# Patient Record
Sex: Female | Born: 1970
Health system: Southern US, Community
[De-identification: ages and names within clinical notes are randomized; demographics above are authoritative.]

## PROBLEM LIST (undated history)

## (undated) DIAGNOSIS — F411 Generalized anxiety disorder: Secondary | ICD-10-CM

## (undated) DIAGNOSIS — F329 Major depressive disorder, single episode, unspecified: Secondary | ICD-10-CM

## (undated) DIAGNOSIS — F4310 Post-traumatic stress disorder, unspecified: Secondary | ICD-10-CM

## (undated) DIAGNOSIS — F900 Attention-deficit hyperactivity disorder, predominantly inattentive type: Secondary | ICD-10-CM

## (undated) DIAGNOSIS — E669 Obesity, unspecified: Secondary | ICD-10-CM

## (undated) DIAGNOSIS — G4733 Obstructive sleep apnea (adult) (pediatric): Secondary | ICD-10-CM

## (undated) DIAGNOSIS — M1990 Unspecified osteoarthritis, unspecified site: Secondary | ICD-10-CM

## (undated) DIAGNOSIS — T7840XA Allergy, unspecified, initial encounter: Secondary | ICD-10-CM

## (undated) DIAGNOSIS — E119 Type 2 diabetes mellitus without complications: Secondary | ICD-10-CM

## (undated) DIAGNOSIS — I2699 Other pulmonary embolism without acute cor pulmonale: Secondary | ICD-10-CM

## (undated) HISTORY — DX: Major depressive disorder, single episode, unspecified: F32.9

## (undated) HISTORY — DX: Post-traumatic stress disorder, unspecified: F43.10

## (undated) HISTORY — PX: SPINE SURGERY: SHX786

## (undated) HISTORY — DX: Unspecified osteoarthritis, unspecified site: M19.90

## (undated) HISTORY — DX: Attention-deficit hyperactivity disorder, predominantly inattentive type: F90.0

## (undated) HISTORY — PX: CHOLECYSTECTOMY: SHX55

## (undated) HISTORY — DX: Generalized anxiety disorder: F41.1

## (undated) HISTORY — PX: OTHER SURGICAL HISTORY: SHX169

## (undated) HISTORY — DX: Allergy, unspecified, initial encounter: T78.40XA

## (undated) HISTORY — DX: Obstructive sleep apnea (adult) (pediatric): G47.33

## (undated) HISTORY — DX: Obesity, unspecified: E66.9

## (undated) HISTORY — PX: BACK SURGERY: SHX140

---

## 2014-02-06 LAB — HM PAP SMEAR

## 2014-02-07 DIAGNOSIS — R87612 Low grade squamous intraepithelial lesion on cytologic smear of cervix (LGSIL): Secondary | ICD-10-CM | POA: Insufficient documentation

## 2014-06-10 DIAGNOSIS — J9819 Other pulmonary collapse: Secondary | ICD-10-CM

## 2014-06-10 HISTORY — DX: Other pulmonary collapse: J98.19

## 2015-05-09 ENCOUNTER — Encounter: Payer: Self-pay | Admitting: Physician Assistant

## 2015-05-09 ENCOUNTER — Other Ambulatory Visit: Payer: Self-pay | Admitting: Physician Assistant

## 2015-05-09 ENCOUNTER — Ambulatory Visit (INDEPENDENT_AMBULATORY_CARE_PROVIDER_SITE_OTHER): Payer: BLUE CROSS/BLUE SHIELD | Admitting: Physician Assistant

## 2015-05-09 VITALS — BP 142/80 | HR 98 | Wt 230.0 lb

## 2015-05-09 DIAGNOSIS — G4700 Insomnia, unspecified: Secondary | ICD-10-CM

## 2015-05-09 DIAGNOSIS — A6920 Lyme disease, unspecified: Secondary | ICD-10-CM | POA: Diagnosis not present

## 2015-05-09 DIAGNOSIS — R5382 Chronic fatigue, unspecified: Secondary | ICD-10-CM

## 2015-05-09 DIAGNOSIS — R002 Palpitations: Secondary | ICD-10-CM | POA: Diagnosis not present

## 2015-05-09 DIAGNOSIS — F331 Major depressive disorder, recurrent, moderate: Secondary | ICD-10-CM

## 2015-05-09 DIAGNOSIS — R87619 Unspecified abnormal cytological findings in specimens from cervix uteri: Secondary | ICD-10-CM

## 2015-05-09 DIAGNOSIS — F900 Attention-deficit hyperactivity disorder, predominantly inattentive type: Secondary | ICD-10-CM

## 2015-05-09 DIAGNOSIS — R5383 Other fatigue: Secondary | ICD-10-CM | POA: Insufficient documentation

## 2015-05-09 DIAGNOSIS — E785 Hyperlipidemia, unspecified: Secondary | ICD-10-CM | POA: Insufficient documentation

## 2015-05-09 DIAGNOSIS — F329 Major depressive disorder, single episode, unspecified: Secondary | ICD-10-CM | POA: Insufficient documentation

## 2015-05-09 HISTORY — DX: Insomnia, unspecified: G47.00

## 2015-05-09 HISTORY — DX: Lyme disease, unspecified: A69.20

## 2015-05-09 HISTORY — DX: Hyperlipidemia, unspecified: E78.5

## 2015-05-09 LAB — CBC WITH DIFFERENTIAL/PLATELET
BASOS PCT: 0 % (ref 0–1)
Basophils Absolute: 0 10*3/uL (ref 0.0–0.1)
EOS PCT: 1 % (ref 0–5)
Eosinophils Absolute: 0.1 10*3/uL (ref 0.0–0.7)
HCT: 42.6 % (ref 36.0–46.0)
Hemoglobin: 14.4 g/dL (ref 12.0–15.0)
Lymphocytes Relative: 29 % (ref 12–46)
Lymphs Abs: 2.6 10*3/uL (ref 0.7–4.0)
MCH: 30.3 pg (ref 26.0–34.0)
MCHC: 33.8 g/dL (ref 30.0–36.0)
MCV: 89.5 fL (ref 78.0–100.0)
MONO ABS: 0.3 10*3/uL (ref 0.1–1.0)
MPV: 9.9 fL (ref 8.6–12.4)
Monocytes Relative: 3 % (ref 3–12)
Neutro Abs: 6 10*3/uL (ref 1.7–7.7)
Neutrophils Relative %: 67 % (ref 43–77)
PLATELETS: 364 10*3/uL (ref 150–400)
RBC: 4.76 MIL/uL (ref 3.87–5.11)
RDW: 14.3 % (ref 11.5–15.5)
WBC: 8.9 10*3/uL (ref 4.0–10.5)

## 2015-05-09 LAB — C-REACTIVE PROTEIN: CRP: 1.8 mg/dL — AB (ref <0.60)

## 2015-05-09 LAB — COMPLETE METABOLIC PANEL WITH GFR
ALBUMIN: 4.3 g/dL (ref 3.6–5.1)
ALK PHOS: 92 U/L (ref 33–115)
ALT: 18 U/L (ref 6–29)
AST: 21 U/L (ref 10–35)
BUN: 11 mg/dL (ref 7–25)
CALCIUM: 9.4 mg/dL (ref 8.6–10.2)
CO2: 21 mmol/L (ref 20–31)
CREATININE: 0.73 mg/dL (ref 0.50–1.10)
Chloride: 104 mmol/L (ref 98–110)
GFR, Est Non African American: >89 mL/min (ref >=60)
Glucose, Bld: 100 mg/dL — ABNORMAL HIGH (ref 65–99)
POTASSIUM: 4.3 mmol/L (ref 3.5–5.3)
Sodium: 137 mmol/L (ref 135–146)
Total Bilirubin: 0.5 mg/dL (ref 0.2–1.2)
Total Protein: 7.1 g/dL (ref 6.1–8.1)

## 2015-05-09 LAB — VITAMIN B12: VITAMIN B 12: 557 pg/mL (ref 200–1100)

## 2015-05-09 LAB — TSH: TSH: 1.12 mIU/L

## 2015-05-09 NOTE — Progress Notes (Addendum)
Subjective:    Patient ID: Julie Johnston, female    DOB: 23-Jan-1971, 45 y.o.   MRN: [id number]  HPI  Patient is a 45 year old female presenting to establish care. Patient's past medical history is relevant for Lyme disease, chronic fatigue, depression, palpitations, and ADHD.  Patient states that she has experienced palpitations and chest tightness intermittently for the past "several years". Patient states that palpitations have become more intense over the past 2.5 weeks with symptoms worsening when supine. Patient states that she has had rhinorrhea and congestion. Patient has been taking Sudafed. Patient states that she is concerned about her renal and kidney function due to the numerous medications that she is currently taking. Patient denies nausea, vomiting, abdominal pain, hematuria, dysuria, fever and shortness of breath.   Review of Systems    Please see HPI.  Objective:   Physical Exam  Constitutional: She is oriented to person, place, and time. She appears well-developed and well-nourished.  HENT:  Head: Normocephalic and atraumatic.  Right Ear: External ear normal.  Left Ear: External ear normal.  Nose: Nose normal.  Mouth/Throat: Oropharynx is clear and moist.  Eyes: Conjunctivae and EOM are normal. Pupils are equal, round, and reactive to light.  Neck: Normal range of motion. No thyromegaly present.  Cardiovascular: Normal rate, regular rhythm and normal heart sounds.   Pulmonary/Chest: Breath sounds normal. No respiratory distress. She has no wheezes. She has no rales.  Abdominal: Soft. Bowel sounds are normal. She exhibits no distension. There is no tenderness. There is no rebound and no guarding.  Musculoskeletal: Normal range of motion.  Neurological: She is alert and oriented to person, place, and time.  Skin: Skin is warm and dry.  Nails were some striated with white characteristic extending to mid nail with no luna.   Psychiatric: She has a normal mood and  affect. Her behavior is normal. Judgment and thought content normal.      Assessment & Plan:   1. Palpitations/elevated BP Patient states that she has experienced palpitations and chest tightness intermittently over the past ten years with worsening of symptoms over the past two weeks. Patient's in office ECG was within normal range. Patient is not under the care of cardiology. Patient was provided patient education about ways to reduce stress and advised to avoid beverages with caffeine. Patient was advised to follow up for Holter monitor if symptoms persist. Patient's labs will be reviewed to rule out a metabolic cause for palpitations. Will recheck BP in 2 months.   2. Hyperlipidemia. Patient will undergo lipid panel to assess for hyperlipidemia.   3. Lyme Disease. Patient is being treated with clarithromycin and doxycycline. Patient is being managed by a Lyme specialist in [place].   4. Insomnia. Patient is being treated with Lunesta.   5. ADHD. Patient will continue taking Modafinil.   6. Depression. Patient will continue taking bupropion and lamotrigine.   7. Birth Control counseling. Patient will continue taking Junel. Will call to get last pap from [facility].  8. Chronic Fatigue. Patient will continue supplements such as green tea and oil of oregano.   9. Abnormal Pap Smear. Patient has a history of abnormal pap smears. Patient's records have been requested.   10. Health Maintenance  Patient is concerned about the numerous medications that she takes on a daily basis. Patient would like to conduct "blood work" to assess her renal and hepatic function. Patient was ordered a CBC, CMP, lipid panel, and TSH. Patient's labs will be  reviewed.   11. Nail changes Will get some blood work to evaluate thyroid,liver, kidney.

## 2015-05-09 NOTE — Addendum Note (Signed)
Addended by: Donella Stade on: 05/09/2015 01:05 PM   Modules accepted: Level of Service

## 2015-05-09 NOTE — Patient Instructions (Signed)
Premature Ventricular Contraction A premature ventricular contraction is an irregularity in the normal heart rhythm. These contractions are extra heartbeats that occur too early in the normal sequence. In most cases, these contractions are harmless and do not require treatment. CAUSES Premature ventricular contractions may occur without a known cause. In healthy people, the extra contractions may be caused by:  Smoking.  Drinking alcohol.  Caffeine.  Certain medicines.  Some illegal drugs.  Stress. Sometimes, changes in chemicals in the blood (electrolytes) can also cause premature ventricular contractions. They can also occur in people with heart diseases that cause a decrease in blood flow to the heart. SIGNS AND SYMPTOMS Premature ventricular contractions often do not cause any symptoms. In some cases, you may have a feeling of your heart beating fast or skipping a beat (palpitations). DIAGNOSIS Your health care provider will take your medical history and do a physical exam. During the exam, the health care provider will check for irregular heartbeats. Various tests may be done to help diagnose premature ventricular contractions. These tests may include:  An ECG (electrocardiogram) to monitor the electrical activity of your heart.  Holter monitor testing. A Holter monitor is a portable device that can monitor the electrical activity of your heart over longer periods of time.  Stress tests to see how exercise affects your heart rhythm.  Echocardiogram. This test uses sound waves (ultrasound) to produce an image of your heart.  Electrophysiology study. This is used to evaluate the electrical conduction system of your heart. TREATMENT Usually, no treatment is needed. You may be advised to avoid things that can trigger the premature contractions, such as caffeine or alcohol. Medicines are sometimes given if symptoms are severe or if the extra heartbeats are very frequent. Treatment may  also be needed for an underlying cause of the contractions if one is found. HOME CARE INSTRUCTIONS  Take medicines only as directed by your health care provider.  Make any lifestyle changes recommended by your health care provider. These may include:  Quitting smoking.  Avoiding or limiting caffeine or alcohol.  Exercising. Talk to your health care provider about what type of exercise is safe for you.  Trying to reduce stress.  Keep all follow-up visits with your health care provider. This is important. SEEK IMMEDIATE MEDICAL CARE IF:  You feel palpitations that are frequent or continual.  You have chest pain.  You have shortness of breath.  You have sweating for no reason.  You have nausea and vomiting.  You become light-headed or faint.   This information is not intended to replace advice given to you by your health care provider. Make sure you discuss any questions you have with your health care provider.   Document Released: 10/24/2003 Document Revised: 03/29/2014 Document Reviewed: 08/09/2013 Elsevier Interactive Patient Education 2016 Elsevier Inc.  

## 2015-05-10 LAB — VITAMIN D 25 HYDROXY (VIT D DEFICIENCY, FRACTURES): Vit D, 25-Hydroxy: 30 ng/mL (ref 30–100)

## 2015-05-14 ENCOUNTER — Encounter: Payer: Self-pay | Admitting: Physician Assistant

## 2015-05-14 DIAGNOSIS — R7303 Prediabetes: Secondary | ICD-10-CM | POA: Insufficient documentation

## 2015-05-14 HISTORY — DX: Prediabetes: R73.03

## 2015-05-14 LAB — HEMOGLOBIN A1C
HEMOGLOBIN A1C: 5.7 % — AB (ref <5.7)
Mean Plasma Glucose: 117 mg/dL — ABNORMAL HIGH (ref <117)

## 2015-05-14 NOTE — Addendum Note (Signed)
Addended by: Huel Cote on: 05/14/2015 10:29 AM   Modules accepted: Orders

## 2015-05-19 ENCOUNTER — Encounter: Payer: Self-pay | Admitting: Physician Assistant

## 2015-06-18 ENCOUNTER — Encounter: Payer: Self-pay | Admitting: Physician Assistant

## 2015-06-18 DIAGNOSIS — G4733 Obstructive sleep apnea (adult) (pediatric): Secondary | ICD-10-CM | POA: Insufficient documentation

## 2015-07-02 ENCOUNTER — Encounter: Payer: Self-pay | Admitting: Physician Assistant

## 2015-07-07 ENCOUNTER — Ambulatory Visit (INDEPENDENT_AMBULATORY_CARE_PROVIDER_SITE_OTHER): Payer: BLUE CROSS/BLUE SHIELD | Admitting: Physician Assistant

## 2015-07-07 ENCOUNTER — Encounter: Payer: Self-pay | Admitting: Physician Assistant

## 2015-07-07 VITALS — BP 131/69 | HR 96 | Temp 98.6°F | Wt 230.0 lb

## 2015-07-07 DIAGNOSIS — Z0000 Encounter for general adult medical examination without abnormal findings: Secondary | ICD-10-CM

## 2015-07-07 DIAGNOSIS — J029 Acute pharyngitis, unspecified: Secondary | ICD-10-CM | POA: Diagnosis not present

## 2015-07-07 DIAGNOSIS — Z1231 Encounter for screening mammogram for malignant neoplasm of breast: Secondary | ICD-10-CM

## 2015-07-07 DIAGNOSIS — A6920 Lyme disease, unspecified: Secondary | ICD-10-CM

## 2015-07-07 DIAGNOSIS — R5382 Chronic fatigue, unspecified: Secondary | ICD-10-CM

## 2015-07-07 DIAGNOSIS — Z1211 Encounter for screening for malignant neoplasm of colon: Secondary | ICD-10-CM | POA: Diagnosis not present

## 2015-07-07 MED ORDER — AMOXICILLIN-POT CLAVULANATE 875-125 MG PO TABS: 1.0000 | ORAL_TABLET | Freq: Two times a day (BID) | ORAL | Status: DC

## 2015-07-07 NOTE — Patient Instructions (Signed)
Will make referral for OB/GYN.

## 2015-07-08 NOTE — Progress Notes (Signed)
   Subjective:    Patient ID: Julie Johnston, female    DOB: 03/30/1970, 45 y.o.   MRN: [id number]  HPI Pt is a 45 yo female who presents to the clinic with ST for last 2 days. She denies any ear pain, sinus pressure, cough or wheezing. She not tried anything. No sick contacts. She wants to make sure not strep. No fever or chills.   She also would like a referral to rheumatology. She has chronic lymes disease and would like to discuss options with another specialist.    Review of Systems  All other systems reviewed and are negative.      Objective:   Physical Exam  Constitutional: She appears well-developed and well-nourished.  HENT:  Head: Normocephalic and atraumatic.  Right Ear: External ear normal.  Left Ear: External ear normal.  Whitish discharge on right enlarged tonsil. Erythema of oropharynx.  TM's clear.  Bilateral nasal turbinates red and swollen.   Eyes: Conjunctivae are normal. No scleral icterus.  Neck: Normal range of motion. Neck supple.  Cardiovascular: Normal rate, regular rhythm and normal heart sounds.   Pulmonary/Chest: Effort normal and breath sounds normal.  Lymphadenopathy:    She has cervical adenopathy.  Psychiatric: She has a normal mood and affect. Her behavior is normal.          Assessment & Plan:  ST-rapid strep negative. Discussed likely viral. Suggested salt water gargles, ibuprofen. If not improving in next 2 days start augmentin.   Need for colon cancer screening. Colonoscopy ordered. Per pt lyme's doctor requested due to increased risk factors.   Mammogram ordered.    Chronic lyme/chronic fatigue- referral made for discussion.

## 2015-07-29 LAB — LIPID PANEL
CHOL/HDL RATIO: 4.6 ratio (ref <=5.0)
CHOLESTEROL: 196 mg/dL (ref 125–200)
HDL: 43 mg/dL — AB (ref >=46)
LDL Cholesterol: 103 mg/dL (ref <130)
TRIGLYCERIDES: 251 mg/dL — AB (ref <150)
VLDL: 50 mg/dL — ABNORMAL HIGH (ref <30)

## 2015-08-01 ENCOUNTER — Ambulatory Visit: Payer: BLUE CROSS/BLUE SHIELD

## 2015-08-02 ENCOUNTER — Encounter: Payer: Self-pay | Admitting: Obstetrics & Gynecology

## 2015-08-02 ENCOUNTER — Encounter: Payer: Self-pay | Admitting: Physician Assistant

## 2015-08-05 ENCOUNTER — Ambulatory Visit (INDEPENDENT_AMBULATORY_CARE_PROVIDER_SITE_OTHER): Payer: BLUE CROSS/BLUE SHIELD | Admitting: Obstetrics & Gynecology

## 2015-08-05 ENCOUNTER — Encounter: Payer: Self-pay | Admitting: Obstetrics & Gynecology

## 2015-08-05 VITALS — BP 116/70 | HR 88 | Ht 65.0 in | Wt 234.0 lb

## 2015-08-05 DIAGNOSIS — Z113 Encounter for screening for infections with a predominantly sexual mode of transmission: Secondary | ICD-10-CM | POA: Diagnosis not present

## 2015-08-05 DIAGNOSIS — Z124 Encounter for screening for malignant neoplasm of cervix: Secondary | ICD-10-CM

## 2015-08-05 DIAGNOSIS — Z01419 Encounter for gynecological examination (general) (routine) without abnormal findings: Secondary | ICD-10-CM

## 2015-08-05 DIAGNOSIS — Z1151 Encounter for screening for human papillomavirus (HPV): Secondary | ICD-10-CM | POA: Diagnosis not present

## 2015-08-05 DIAGNOSIS — N898 Other specified noninflammatory disorders of vagina: Secondary | ICD-10-CM

## 2015-08-05 DIAGNOSIS — L298 Other pruritus: Secondary | ICD-10-CM | POA: Diagnosis not present

## 2015-08-05 MED ORDER — NORETHIN ACE-ETH ESTRAD-FE 1-20 MG-MCG PO TABS: ORAL_TABLET | ORAL | Status: DC

## 2015-08-05 NOTE — Progress Notes (Signed)
Subjective:    Julie Johnston is a 45 y.o. DWP0  female who presents for an annual exam. The patient has no complaints today except for some vaginal itcing for the last 10 days. She has been on po nystatin and then took diflucan but only minimal help.  The patient is sexually active. GYN screening history: last pap: was normal with HR HPV in 2015. She also had an abnormal pap last year and had a colpo at [facility].  The patient wears seatbelts: yes. The patient participates in regular exercise: yes. Has the patient ever been transfused or tattooed?: yes. The patient reports that there is not domestic violence in her life.   Menstrual History: OB History    Gravida Para Term Preterm AB TAB SAB Ectopic Multiple Living   0 0 0 0 0 0 0 0 0 0       Menarche age: 34  No LMP recorded. Patient is not currently having periods (Reason: Oral contraceptives).    The following portions of the patient's history were reviewed and updated as appropriate: allergies, current medications, past family history, past medical history, past social history, past surgical history and problem list.  Review of Systems Pertinent items noted in HPI and remainder of comprehensive ROS otherwise negative.  Monogamous for 2 years. Works for herself doing Psychologist, counselling of CME programs. Uses OCPs in a continuous fashion. Mammogram scheduled.    Objective:    BP 116/70 mmHg  Pulse 88  Ht 5\' 5"  (1.651 m)  Wt 234 lb (106.142 kg)  BMI 38.94 kg/m2  General Appearance:    Alert, cooperative, no distress, appears stated age  Head:    Normocephalic, without obvious abnormality, atraumatic  Eyes:    PERRL, conjunctiva/corneas clear, EOM's intact, fundi    benign, both eyes  Ears:    Normal TM's and external ear canals, both ears  Nose:   Nares normal, septum midline, mucosa normal, no drainage    or sinus tenderness  Throat:   Lips, mucosa, and tongue normal; teeth and gums normal  Neck:   Supple, symmetrical, trachea midline,  no adenopathy;    thyroid:  no enlargement/tenderness/nodules; no carotid   bruit or JVD  Back:     Symmetric, no curvature, ROM normal, no CVA tenderness  Lungs:     Clear to auscultation bilaterally, respirations unlabored  Chest Wall:    No tenderness or deformity   Heart:    Regular rate and rhythm, S1 and S2 normal, no murmur, rub   or gallop  Breast Exam:    No tenderness, masses, or nipple abnormality  Abdomen:     Soft, non-tender, bowel sounds active all four quadrants,    no masses, no organomegaly  Genitalia:    Normal female without lesion, discharge or tenderness, Normal discharge, NSSA, NT, no palpable adnexal masses     Extremities:   Extremities normal, atraumatic, no cyanosis or edema  Pulses:   2+ and symmetric all extremities  Skin:   Skin color, texture, turgor normal, no rashes or lesions  Lymph nodes:   Cervical, supraclavicular, and axillary nodes normal  Neurologic:   CNII-XII intact, normal strength, sensation and reflexes    throughout  .    Assessment:    Healthy female exam.   Vaginal itching   Plan:     Thin prep Pap smear. with cotesting, CT/GC Continue OCPs Wet prep sent

## 2015-08-06 ENCOUNTER — Other Ambulatory Visit: Payer: Self-pay | Admitting: Physician Assistant

## 2015-08-06 DIAGNOSIS — E781 Pure hyperglyceridemia: Secondary | ICD-10-CM

## 2015-08-06 MED ORDER — AMBULATORY NON FORMULARY MEDICATION
Status: DC
Start: 2015-08-06 — End: 2016-02-10

## 2015-08-08 LAB — CYTOLOGY - PAP

## 2015-08-13 ENCOUNTER — Ambulatory Visit (INDEPENDENT_AMBULATORY_CARE_PROVIDER_SITE_OTHER): Payer: BLUE CROSS/BLUE SHIELD

## 2015-08-13 DIAGNOSIS — Z1231 Encounter for screening mammogram for malignant neoplasm of breast: Secondary | ICD-10-CM

## 2015-09-18 ENCOUNTER — Encounter: Payer: Self-pay | Admitting: Physician Assistant

## 2015-09-18 ENCOUNTER — Encounter: Payer: Self-pay | Admitting: Obstetrics & Gynecology

## 2015-09-19 ENCOUNTER — Ambulatory Visit (INDEPENDENT_AMBULATORY_CARE_PROVIDER_SITE_OTHER): Payer: BLUE CROSS/BLUE SHIELD | Admitting: Physician Assistant

## 2015-09-19 ENCOUNTER — Encounter: Payer: Self-pay | Admitting: Physician Assistant

## 2015-09-19 VITALS — BP 147/82 | HR 96 | Ht 65.0 in | Wt 239.0 lb

## 2015-09-19 DIAGNOSIS — M797 Fibromyalgia: Secondary | ICD-10-CM | POA: Diagnosis not present

## 2015-09-19 DIAGNOSIS — R824 Acetonuria: Secondary | ICD-10-CM | POA: Diagnosis not present

## 2015-09-19 DIAGNOSIS — E669 Obesity, unspecified: Secondary | ICD-10-CM | POA: Diagnosis not present

## 2015-09-19 LAB — POCT URINALYSIS DIPSTICK
GLUCOSE UA: NEGATIVE
Leukocytes, UA: NEGATIVE
NITRITE UA: NEGATIVE
PROTEIN UA: NEGATIVE
RBC UA: NEGATIVE
Spec Grav, UA: >=1.03
UROBILINOGEN UA: 0.2
pH, UA: 5.5

## 2015-09-19 MED ORDER — TRAMADOL HCL 50 MG PO TABS: 50.0000 mg | ORAL_TABLET | Freq: Three times a day (TID) | ORAL | Status: DC | PRN

## 2015-09-19 MED ORDER — LORCASERIN HCL 10 MG PO TABS: 1.0000 | ORAL_TABLET | Freq: Two times a day (BID) | ORAL | Status: DC

## 2015-09-19 MED ORDER — CELECOXIB 200 MG PO CAPS: 200.0000 mg | ORAL_CAPSULE | Freq: Two times a day (BID) | ORAL | Status: DC

## 2015-09-19 NOTE — Patient Instructions (Addendum)
Missy- massage envy.   Myofascial Pain Syndrome and Fibromyalgia Myofascial pain syndrome and fibromyalgia are both pain disorders. This pain may be felt mainly in your muscles.   Myofascial pain syndrome:  Always has trigger points or tender points in the muscle that will cause pain when pressed. The pain may come and go.  Usually affects your neck, upper back, and shoulder areas. The pain often radiates into your arms and hands.  Fibromyalgia:  Has muscle pains and tenderness that come and go.  Is often associated with fatigue and sleep disturbances.  Has trigger points.  Tends to be long-lasting (chronic), but is not life-threatening. Fibromyalgia and myofascial pain are not the same. However, they often occur together. If you have both conditions, each can make the other worse. Both are common and can cause enough pain and fatigue to make day-to-day activities difficult.  CAUSES  The exact causes of fibromyalgia and myofascial pain are not known. People with certain gene types may be more likely to develop fibromyalgia. Some factors can be triggers for both conditions, such as:   Spine disorders.  Arthritis.  Severe injury (trauma) and other physical stressors.  Being under a lot of stress.  A medical illness. SIGNS AND SYMPTOMS  Fibromyalgia The main symptom of fibromyalgia is widespread pain and tenderness in your muscles. This can vary over time. Pain is sometimes described as stabbing, shooting, or burning. You may have tingling or numbness, too. You may also have sleep problems and fatigue. You may wake up feeling tired and groggy (fibro fog). Other symptoms may include:   Bowel and bladder problems.  Headaches.  Visual problems.  Problems with odors and noises.  Depression or mood changes.  Painful menstrual periods (dysmenorrhea).  Dry skin or eyes. Myofascial pain syndrome Symptoms of myofascial pain syndrome include:   Tight, ropy bands of muscle.    Uncomfortable sensations in muscular areas, such as:  Aching.  Cramping.  Burning.  Numbness.  Tingling.   Muscle weakness.  Trouble moving certain muscles freely (range of motion). DIAGNOSIS  There are no specific tests to diagnose fibromyalgia or myofascial pain syndrome. Both can be hard to diagnose because their symptoms are common in many other conditions. Your health care provider may suspect one or both of these conditions based on your symptoms and medical history. Your health care provider will also do a physical exam.  The key to diagnosing fibromyalgia is having pain, fatigue, and other symptoms for more than three months that cannot be explained by another condition.  The key to diagnosing myofascial pain syndrome is finding trigger points in muscles that are tender and cause pain elsewhere in your body (referred pain). TREATMENT  Treating fibromyalgia and myofascial pain often requires a team of health care providers. This usually starts with your primary provider and a physical therapist. You may also find it helpful to work with alternative health care providers, such as massage therapists or acupuncturists. Treatment for fibromyalgia may include medicines. This may include nonsteroidal anti-inflammatory drugs (NSAIDs), along with other medicines.  Treatment for myofascial pain may also include:  NSAIDs.  Cooling and stretching of muscles.  Trigger point injections.  Sound wave (ultrasound) treatments to stimulate muscles. HOME CARE INSTRUCTIONS   Take medicines only as directed by your health care provider.  Exercise as directed by your health care provider or physical therapist.  Try to avoid stressful situations.  Practice relaxation techniques to control your stress. You may want to try:  Biofeedback.  Visual imagery.  Hypnosis.  Muscle relaxation.  Yoga.  Meditation.  Talk to your health care provider about alternative treatments, such as  acupuncture or massage treatment.  Maintain a healthy lifestyle. This includes eating a healthy diet and getting enough sleep.  Consider joining a support group.  Do not do activities that stress or strain your muscles. That includes repetitive motions and heavy lifting. SEEK MEDICAL CARE IF:   You have new symptoms.  Your symptoms get worse.  You have side effects from your medicines.  You have trouble sleeping.  Your condition is causing depression or anxiety. FOR MORE INFORMATION   National Fibromyalgia Association: http://www.fmaware.orgwww.fmaware.[place]: http://www.arthritis.orgwww.arthritis.org  American Chronic Pain Association: StreetWrestling.at.https://stevens.biz/   This information is not intended to replace advice given to you by your health care provider. Make sure you discuss any questions you have with your health care provider.   Document Released: 03/08/2005 Document Revised: 03/29/2014 Document Reviewed: 12/12/2013 Elsevier Interactive Patient Education [employer].

## 2015-09-22 DIAGNOSIS — R824 Acetonuria: Secondary | ICD-10-CM | POA: Insufficient documentation

## 2015-09-22 DIAGNOSIS — M797 Fibromyalgia: Secondary | ICD-10-CM | POA: Insufficient documentation

## 2015-09-22 DIAGNOSIS — E669 Obesity, unspecified: Secondary | ICD-10-CM | POA: Insufficient documentation

## 2015-09-22 NOTE — Progress Notes (Addendum)
   Subjective:    Patient ID: Julie Johnston, female    DOB: 1971-02-19, 45 y.o.   MRN: [id number]  HPI Pt is a 45 yo female who presents to the clinic to follow up on OTC urine test that showed ketones. She is having no abdominal or flank pain, no discomfort, no blood and no symptoms.   She also would like to discuss what to do about fibromyalgia. She was seen by rheumatologist and they deferred to PcP for management. She continues to have daily pain.     Review of Systems  All other systems reviewed and are negative.      Objective:   Physical Exam  Constitutional: She is oriented to person, place, and time. She appears well-developed and well-nourished.  Obesity.   HENT:  Head: Normocephalic and atraumatic.  Cardiovascular: Normal rate, regular rhythm and normal heart sounds.   Pulmonary/Chest: Effort normal and breath sounds normal.  Neurological: She is alert and oriented to person, place, and time.  Skin: Skin is dry.  Psychiatric: She has a normal mood and affect. Her behavior is normal.          Assessment & Plan:  Urine ketones- .[name] Kitchen Results for orders placed or performed in visit on 09/19/15  POCT urinalysis dipstick  Result Value Ref Range   Color, UA dark yellow    Clarity, UA clear    Glucose, UA neg    Bilirubin, UA small    Ketones, UA trace    Spec Grav, UA >=1.030    Blood, UA neg    pH, UA 5.5    Protein, UA neg    Urobilinogen, UA 0.2    Nitrite, UA neg    Leukocytes, UA Negative Negative   Trace in urine today. Last A!C was good. I do not think caused by DM. Discussed how exercise and diet can cause ketones in urine.   Fibromyalgia- she cannot tolerate gabapentin. She is concerned about weight gain with lyrica. discussed adding a cymbalta or effexor to med list. Pt concerned will mess with other mood medication. Discussed with psychiatrist first but encouraged this can help with pain. Started celebrex bid. Discussed side effects. Tried and failed  advil, mobic, and tramadol for pain.  Discussed regular exercise and weight loss. Make sure taking vitamin d [phone] units daily.   Obesity- started belviq bid. Discussed side effects. Start managing calories at 1500 a day. Follow up in 4-6 weeks.   MDD- started on Vraylar. Continue on lamictal and wellbutrin. See does feel like her mood overall is improving. mangeaged by mood clinic.

## 2015-09-25 ENCOUNTER — Encounter: Payer: Self-pay | Admitting: Physician Assistant

## 2015-09-25 ENCOUNTER — Telehealth: Payer: Self-pay | Admitting: *Deleted

## 2015-09-25 NOTE — Telephone Encounter (Addendum)
Could not initiate through covermymeds. Called and they are faxing forms  Form faxed for celebrex  Luvenia Starch did you want the Belviq to be 10 mg bid or was it to be 20 mg qd? Just asking because the quantity is #30. Wanted to clarify before completing PA

## 2015-09-26 ENCOUNTER — Other Ambulatory Visit: Payer: Self-pay | Admitting: *Deleted

## 2015-09-26 MED ORDER — LORCASERIN HCL 10 MG PO TABS: 1.0000 | ORAL_TABLET | Freq: Two times a day (BID) | ORAL | Status: DC

## 2015-09-26 NOTE — Telephone Encounter (Signed)
belviq PA submitted this am; called the pharm and changed quatity to #60

## 2015-09-26 NOTE — Telephone Encounter (Signed)
belviq 10mg  bid #60

## 2015-10-06 NOTE — Telephone Encounter (Signed)
belviq approved and celebrex denied   Called pt and vm full  Pharm notified via vm

## 2015-10-06 NOTE — Telephone Encounter (Signed)
Denial letter placed in box

## 2015-10-07 ENCOUNTER — Encounter: Payer: Self-pay | Admitting: Physician Assistant

## 2015-10-30 ENCOUNTER — Encounter: Payer: Self-pay | Admitting: Physician Assistant

## 2015-11-04 ENCOUNTER — Other Ambulatory Visit: Payer: Self-pay | Admitting: Physician Assistant

## 2015-11-04 MED ORDER — DICLOFENAC SODIUM 75 MG PO TBEC: 75.0000 mg | DELAYED_RELEASE_TABLET | Freq: Two times a day (BID) | ORAL | 5 refills | Status: DC

## 2015-12-15 ENCOUNTER — Other Ambulatory Visit: Payer: Self-pay | Admitting: Physician Assistant

## 2016-01-13 ENCOUNTER — Other Ambulatory Visit: Payer: Self-pay | Admitting: Physician Assistant

## 2016-01-13 ENCOUNTER — Ambulatory Visit (INDEPENDENT_AMBULATORY_CARE_PROVIDER_SITE_OTHER): Payer: BLUE CROSS/BLUE SHIELD | Admitting: Physician Assistant

## 2016-01-13 DIAGNOSIS — R300 Dysuria: Secondary | ICD-10-CM

## 2016-01-13 LAB — POCT URINALYSIS DIPSTICK
BILIRUBIN UA: NEGATIVE
Glucose, UA: NEGATIVE
Ketones, UA: NEGATIVE
NITRITE UA: NEGATIVE
PH UA: 6
PROTEIN UA: 30
Spec Grav, UA: 1.01
Urobilinogen, UA: 0.2

## 2016-01-13 MED ORDER — CIPROFLOXACIN HCL 500 MG PO TABS: 500.0000 mg | ORAL_TABLET | Freq: Two times a day (BID) | ORAL | 0 refills | Status: DC

## 2016-01-13 NOTE — Progress Notes (Signed)
Patient came into clinic today for possible UTI. Pt reports dysuria and increased flank pain for the past 5-6 days. Denies any back or abdominal pain. Pt states she took a home test and it showed positive leukocytes and protein. Pt does report having some yeast infections symptoms 2 weeks ago, has been symptom free for the past week. POCT urine dipstick and urine culture ordered today. Results given to PCP prior to leaving clinic today, Pt advised new Rx would be sent to local pharmacy. Informed we will contact her with urine culture results. Verbalized understanding, no further questions/concerns at this time.

## 2016-01-13 NOTE — Progress Notes (Signed)
Pt came in for UTI nurse visit.  .. Results for orders placed or performed in visit on 01/13/16  POCT Urinalysis Dipstick  Result Value Ref Range   Color, UA yellow    Clarity, UA clear    Glucose, UA neg    Bilirubin, UA neg    Ketones, UA neg    Spec Grav, UA 1.010    Blood, UA mod    pH, UA 6.0    Protein, UA 30    Urobilinogen, UA 0.2    Nitrite, UA neg    Leukocytes, UA moderate (2+) (A) Negative   She is having dysuria and frequency.  No fever, chills, flank pain.  Will culture.  cipro sent. Side effects discussed.  Symptomatic care discussed.

## 2016-01-14 LAB — URINE CULTURE: ORGANISM ID, BACTERIA: NO GROWTH

## 2016-02-10 ENCOUNTER — Encounter: Payer: Self-pay | Admitting: Physician Assistant

## 2016-02-10 ENCOUNTER — Ambulatory Visit (INDEPENDENT_AMBULATORY_CARE_PROVIDER_SITE_OTHER): Payer: BLUE CROSS/BLUE SHIELD | Admitting: Physician Assistant

## 2016-02-10 VITALS — BP 128/75 | HR 82 | Ht 65.0 in | Wt 233.0 lb

## 2016-02-10 DIAGNOSIS — R300 Dysuria: Secondary | ICD-10-CM

## 2016-02-10 DIAGNOSIS — R102 Pelvic and perineal pain: Secondary | ICD-10-CM

## 2016-02-10 DIAGNOSIS — R319 Hematuria, unspecified: Secondary | ICD-10-CM | POA: Diagnosis not present

## 2016-02-10 DIAGNOSIS — R3915 Urgency of urination: Secondary | ICD-10-CM

## 2016-02-10 LAB — POCT URINALYSIS DIPSTICK
Bilirubin, UA: NEGATIVE
Glucose, UA: NEGATIVE
Ketones, UA: NEGATIVE
NITRITE UA: NEGATIVE
PH UA: 6
PROTEIN UA: NEGATIVE
Spec Grav, UA: 1.015
UROBILINOGEN UA: 0.2

## 2016-02-10 MED ORDER — HYDROCORTISONE VALERATE 0.2 % EX OINT
1.0000 | TOPICAL_OINTMENT | Freq: Two times a day (BID) | CUTANEOUS | 0 refills | Status: DC
Start: 2016-02-10 — End: 2017-03-21

## 2016-02-10 MED ORDER — PHENAZOPYRIDINE HCL 200 MG PO TABS: 200.0000 mg | ORAL_TABLET | Freq: Three times a day (TID) | ORAL | 0 refills | Status: AC

## 2016-02-10 NOTE — Progress Notes (Addendum)
  Subjective:    Patient ID: Julie Johnston, female    DOB: [birth year], 45 y.o.   MRN: [id number]  HPI Patient is a 45 yo female with complaints of urinary frequency, urgency and dysuria. Patient came into the office a month ago with similar complaints and was treated with Cipro for a UTI. Once the culture came back negative, she was asked to discontinue the Cipro by her provider. Patient also complains of labial soreness and burning. Patient also tried to have intercourse two days ago but stated that it was too painful to complete. Patient denies dysuria, vaginal discharge, abnormal odors or history of STIs.    Review of Systems  Constitutional: Negative for fatigue and fever.  Genitourinary: Positive for dyspareunia, dysuria, frequency, genital sores, urgency and vaginal pain. Negative for difficulty urinating, flank pain, hematuria and vaginal discharge.       Objective:   Physical Exam  Constitutional: She is oriented to person, place, and time. She appears well-developed and well-nourished. No distress.  HENT:  Head: Normocephalic and atraumatic.  Cardiovascular: Normal rate, regular rhythm and normal heart sounds.   Pulmonary/Chest: Effort normal and breath sounds normal.  No CVA tenderness.   Abdominal: Bowel sounds are normal. She exhibits no distension. There is no tenderness. There is no rebound and no guarding.  Neurological: She is alert and oriented to person, place, and time.  Psychiatric: She has a normal mood and affect. Her behavior is normal.     Urine dipstick shows positive for small amount of leukocytes and trace of blood.     Assessment & Plan:  [name] was seen today for urinary urgency and dysuria.  Diagnoses and all orders for this visit:  Dysuria -     POCT urinalysis dipstick -     phenazopyridine (PYRIDIUM) 200 MG tablet; Take 1 tablet (200 mg total) by mouth 3 (three) times daily. For 3 days.  Urinary urgency -     POCT urinalysis  dipstick  Vaginal pain -     hydrocortisone valerate ointment (WESTCORT) 0.2 %; Apply 1 application topically 2 (two) times daily.  .. Results for orders placed or performed in visit on 02/10/16  POCT urinalysis dipstick  Result Value Ref Range   Color, UA yellow    Clarity, UA clear    Glucose, UA neg    Bilirubin, UA neg    Ketones, UA neg    Spec Grav, UA 1.015    Blood, UA trace-lysed    pH, UA 6.0    Protein, UA neg    Urobilinogen, UA 0.2    Nitrite, UA neg    Leukocytes, UA small (1+) (A) Negative     Performed urinalysis and urine culture is pending for symptoms of dysuria and urinary frequency. Patient given pyridium to help with dysuria and urinary frequency and urgency for three days. Start on hydrocortisone valerate ointment for labial pain. Will call patient when urine culture comes back. Patient advised to come back if symptoms worsen.

## 2016-02-10 NOTE — Patient Instructions (Signed)
Interstitial Cystitis Introduction Interstitial cystitis is a condition that causes inflammation of the bladder. The bladder is a hollow organ in the lower part of your abdomen. It stores urine after the urine is made by your kidneys. With interstitial cystitis, you may have pain in the bladder area. You may also have a frequent and urgent need to urinate. The severity of interstitial cystitis can vary from person to person. You may have flare-ups of the condition, and then it may go away for a while. For many people who have this condition, it becomes a long-term problem. What are the causes? The cause of this condition is not known. What increases the risk? This condition is more likely to develop in women. What are the signs or symptoms? Symptoms of interstitial cystitis vary, and they can change over time. Symptoms may include:  Discomfort or pain in the bladder area. This can range from mild to severe. The pain may change in intensity as the bladder fills with urine or as it empties.  Pelvic pain.  An urgent need to urinate.  Frequent urination.  Pain during sexual intercourse.  Pinpoint bleeding on the bladder wall. For women, the symptoms often get worse during menstruation. How is this diagnosed? This condition is diagnosed by evaluating your symptoms and ruling out other causes. A physical exam will be done. Various tests may be done to rule out other conditions. Common tests include:  Urine tests.  Cystoscopy. In this test, a tool that is like a very thin telescope is used to look into your bladder.  Biopsy. This involves taking a sample of tissue from the bladder wall to be examined under a microscope. How is this treated? There is no cure for interstitial cystitis, but treatment methods are available to control your symptoms. Work closely with your health care provider to find the treatments that will be most effective for you. Treatment options may include:  Medicines  to relieve pain and to help reduce the number of times that you feel the need to urinate.  Bladder training. This involves learning ways to control when you urinate, such as:  Urinating at scheduled times.  Training yourself to delay urination.  Doing exercises (Kegel exercises) to strengthen the muscles that control urine flow.  Lifestyle changes, such as changing your diet or taking steps to control stress.  Use of a device that provides electrical stimulation in order to reduce pain.  A procedure that stretches your bladder by filling it with air or fluid.  Surgery. This is rare. It is only done for extreme cases if other treatments do not help. Follow these instructions at home:  Take medicines only as directed by your health care provider.  Use bladder training techniques as directed.  Keep a bladder diary to find out which foods, liquids, or activities make your symptoms worse.  Use your bladder diary to schedule bathroom trips. If you are away from home, plan to be near a bathroom at each of your scheduled times.  Make sure you urinate just before you leave the house and just before you go to bed.  Do Kegel exercises as directed by your health care provider.  Do not drink alcohol.  Do not use any tobacco products, including cigarettes, chewing tobacco, or electronic cigarettes. If you need help quitting, ask your health care provider.  Make dietary changes as directed by your health care provider. You may need to avoid spicy foods and foods that contain a high amount of potassium.    Limit your drinking of beverages that stimulate urination. These include soda, coffee, and tea.  Keep all follow-up visits as directed by your health care provider. This is important. Contact a health care provider if:  Your symptoms do not get better after treatment.  Your pain and discomfort are getting worse.  You have more frequent urges to urinate.  You have a fever. Get help  right away if:  You are not able to control your bladder at all. This information is not intended to replace advice given to you by your health care provider. Make sure you discuss any questions you have with your health care provider. Document Released: 11/07/2003 Document Revised: 08/14/2015 Document Reviewed: 11/13/2013  2017 Elsevier  

## 2016-02-13 LAB — URINE CULTURE

## 2016-02-14 ENCOUNTER — Other Ambulatory Visit: Payer: Self-pay | Admitting: Physician Assistant

## 2016-02-14 MED ORDER — CIPROFLOXACIN HCL 500 MG PO TABS: 500.0000 mg | ORAL_TABLET | Freq: Two times a day (BID) | ORAL | 0 refills | Status: DC

## 2016-02-23 ENCOUNTER — Ambulatory Visit (INDEPENDENT_AMBULATORY_CARE_PROVIDER_SITE_OTHER): Payer: BLUE CROSS/BLUE SHIELD | Admitting: Physician Assistant

## 2016-02-23 ENCOUNTER — Encounter: Payer: Self-pay | Admitting: Physician Assistant

## 2016-02-23 VITALS — BP 133/73 | HR 81 | Ht 65.0 in | Wt 235.0 lb

## 2016-02-23 DIAGNOSIS — N3001 Acute cystitis with hematuria: Secondary | ICD-10-CM | POA: Diagnosis not present

## 2016-02-23 DIAGNOSIS — R635 Abnormal weight gain: Secondary | ICD-10-CM

## 2016-02-23 DIAGNOSIS — T50905A Adverse effect of unspecified drugs, medicaments and biological substances, initial encounter: Secondary | ICD-10-CM | POA: Insufficient documentation

## 2016-02-23 LAB — POCT URINALYSIS DIPSTICK
BILIRUBIN UA: NEGATIVE
GLUCOSE UA: NEGATIVE
Ketones, UA: NEGATIVE
Leukocytes, UA: NEGATIVE
Nitrite, UA: NEGATIVE
Protein, UA: NEGATIVE
RBC UA: NEGATIVE
Urobilinogen, UA: 0.2
pH, UA: 6.5

## 2016-02-23 MED ORDER — METFORMIN HCL 500 MG PO TABS: 500.0000 mg | ORAL_TABLET | Freq: Two times a day (BID) | ORAL | 3 refills | Status: DC

## 2016-02-23 NOTE — Patient Instructions (Addendum)
saxenda daily injectable.

## 2016-02-23 NOTE — Progress Notes (Addendum)
   Subjective:    Patient ID: Julie Johnston, female    DOB: [birth year], 45 y.o.   MRN: [id number]  HPI Patient is a 45 yo female coming to the office today for a follow-up after a UTI. Patient was treated with cipro 500 mg two times a day for 7 days. Patient denies dysuria or labial soreness. Patient reports that she is still urinating frequency.   Patient also wants to discuss weight loss. Patient's psychiatrist recommended to the patient that Metformin could aid with weight loss.    Review of Systems See HPI     Objective:   Physical Exam  Constitutional: She appears well-developed and well-nourished. No distress.  Pulmonary/Chest: Effort normal. No respiratory distress.   Urinalysis    Component Value Date/Time   BILIRUBINUR neg 02/23/2016 1037   PROTEINUR neg 02/23/2016 1037   UROBILINOGEN 0.2 02/23/2016 1037   NITRITE neg 02/23/2016 1037   LEUKOCYTESUR Negative 02/23/2016 1037      Assessment & Plan:  Diagnoses and all orders for this visit:  1. Acute cystitis with hematuria -  POCT urinalysis dipstick negative for blood, leuks, nitrates.  -reassured patient has resolved and frequent urination likely due to increased liquids to resolve infection.  -follow up as needed.   2. Abnormal weight gain/Drug-induced weight gain  - Add metFORMIN (GLUCOPHAGE) 500 MG tablet; Take 1 tablet (500 mg total) by mouth 2 (two) times daily with a meal to help aid weight loss due to patient gaining weight. Patient is taking Vraylar which is known to cause weight gain.  -Follow-up as needed given handout about saxenda to consider for weight loss.

## 2016-04-11 ENCOUNTER — Other Ambulatory Visit: Payer: Self-pay | Admitting: Physician Assistant

## 2016-05-07 ENCOUNTER — Other Ambulatory Visit: Payer: Self-pay | Admitting: Physician Assistant

## 2016-06-13 ENCOUNTER — Other Ambulatory Visit: Payer: Self-pay | Admitting: Physician Assistant

## 2016-07-08 ENCOUNTER — Other Ambulatory Visit: Payer: Self-pay | Admitting: *Deleted

## 2016-07-08 MED ORDER — DICLOFENAC SODIUM 75 MG PO TBEC: 75.0000 mg | DELAYED_RELEASE_TABLET | Freq: Two times a day (BID) | ORAL | 1 refills | Status: DC

## 2016-08-17 ENCOUNTER — Other Ambulatory Visit: Payer: Self-pay | Admitting: Obstetrics & Gynecology

## 2016-08-20 ENCOUNTER — Other Ambulatory Visit: Payer: Self-pay | Admitting: Physician Assistant

## 2017-03-21 ENCOUNTER — Encounter: Payer: Self-pay | Admitting: Physician Assistant

## 2017-03-21 ENCOUNTER — Telehealth: Payer: Self-pay | Admitting: *Deleted

## 2017-03-21 ENCOUNTER — Ambulatory Visit: Payer: BLUE CROSS/BLUE SHIELD | Admitting: Physician Assistant

## 2017-03-21 DIAGNOSIS — R635 Abnormal weight gain: Secondary | ICD-10-CM

## 2017-03-21 DIAGNOSIS — Z131 Encounter for screening for diabetes mellitus: Secondary | ICD-10-CM

## 2017-03-21 DIAGNOSIS — E039 Hypothyroidism, unspecified: Secondary | ICD-10-CM

## 2017-03-21 DIAGNOSIS — Z13220 Encounter for screening for lipoid disorders: Secondary | ICD-10-CM | POA: Diagnosis not present

## 2017-03-21 DIAGNOSIS — F331 Major depressive disorder, recurrent, moderate: Secondary | ICD-10-CM

## 2017-03-21 DIAGNOSIS — T50905A Adverse effect of unspecified drugs, medicaments and biological substances, initial encounter: Secondary | ICD-10-CM | POA: Diagnosis not present

## 2017-03-21 MED ORDER — LIRAGLUTIDE -WEIGHT MANAGEMENT 18 MG/3ML ~~LOC~~ SOPN: 0.6000 mg | PEN_INJECTOR | Freq: Every day | SUBCUTANEOUS | 1 refills | Status: DC

## 2017-03-21 NOTE — Patient Instructions (Signed)
Liraglutide injection (Weight Management) What is this medicine? LIRAGLUTIDE (LIR a GLOO tide) is used with a reduced calorie diet and exercise to help you lose weight. This medicine may be used for other purposes; ask your health care provider or pharmacist if you have questions. COMMON BRAND NAME(S): Saxenda What should I tell my health care provider before I take this medicine? They need to know if you have any of these conditions: -endocrine tumors (MEN 2) or if someone in your family had these tumors -gallbladder disease -high cholesterol -history of alcohol abuse problem -history of pancreatitis -kidney disease or if you are on dialysis -liver disease -previous swelling of the tongue, face, or lips with difficulty breathing, difficulty swallowing, hoarseness, or tightening of the throat -stomach problems -suicidal thoughts, plans, or attempt; a previous suicide attempt by you or a family member -thyroid cancer or if someone in your family had thyroid cancer -an unusual or allergic reaction to liraglutide, other medicines, foods, dyes, or preservatives -pregnant or trying to get pregnant -breast-feeding How should I use this medicine? This medicine is for injection under the skin of your upper leg, stomach area, or upper arm. You will be taught how to prepare and give this medicine. Use exactly as directed. Take your medicine at regular intervals. Do not take it more often than directed. It is important that you put your used needles and syringes in a special sharps container. Do not put them in a trash can. If you do not have a sharps container, call your pharmacist or healthcare provider to get one. A special MedGuide will be given to you by the pharmacist with each prescription and refill. Be sure to read this information carefully each time. Talk to your pediatrician regarding the use of this medicine in children. Special care may be needed. Overdosage: If you think you have taken  too much of this medicine contact a poison control center or emergency room at once. NOTE: This medicine is only for you. Do not share this medicine with others. What if I miss a dose? If you miss a dose, take it as soon as you can. If it is almost time for your next dose, take only that dose. Do not take double or extra doses. If you miss your dose for 3 days or more, call your doctor or health care professional to talk about how to restart this medicine. What may interact with this medicine? -insulin and other medicines for diabetes This list may not describe all possible interactions. Give your health care provider a list of all the medicines, herbs, non-prescription drugs, or dietary supplements you use. Also tell them if you smoke, drink alcohol, or use illegal drugs. Some items may interact with your medicine. What should I watch for while using this medicine? Visit your doctor or health care professional for regular checks on your progress. This medicine is intended to be used in addition to a healthy diet and appropriate exercise. The best results are achieved this way. Do not increase or in any way change your dose without consulting your doctor or health care professional. Drink plenty of fluids while taking this medicine. Check with your doctor or health care professional if you get an attack of severe diarrhea, nausea, and vomiting. The loss of too much body fluid can make it dangerous for you to take this medicine. This medicine may affect blood sugar levels. If you have diabetes, check with your doctor or health care professional before you change your diet  or the dose of your diabetic medicine. Patients and their families should watch out for worsening depression or thoughts of suicide. Also watch out for sudden changes in feelings such as feeling anxious, agitated, panicky, irritable, hostile, aggressive, impulsive, severely restless, overly excited and hyperactive, or not being able to  sleep. If this happens, especially at the beginning of treatment or after a change in dose, call your health care professional. What side effects may I notice from receiving this medicine? Side effects that you should report to your doctor or health care professional as soon as possible: -allergic reactions like skin rash, itching or hives, swelling of the face, lips, or tongue -breathing problems -diarrhea that continues or is severe -lump or swelling on the neck -severe nausea -signs and symptoms of infection like fever or chills; cough; sore throat; pain or trouble passing urine -signs and symptoms of low blood sugar such as feeling anxious, confusion, dizziness, increased hunger, unusually weak or tired, sweating, shakiness, cold, irritable, headache, blurred vision, fast heartbeat, loss of consciousness -signs and symptoms of kidney injury like trouble passing urine or change in the amount of urine -trouble swallowing -unusual stomach upset or pain -vomiting Side effects that usually do not require medical attention (report to your doctor or health care professional if they continue or are bothersome): -constipation -decreased appetite -diarrhea -fatigue -headache -nausea -pain, redness, or irritation at site where injected -stomach upset -stuffy or runny nose This list may not describe all possible side effects. Call your doctor for medical advice about side effects. You may report side effects to FDA at 1-800-FDA-1088. Where should I keep my medicine? Keep out of the reach of children. Store unopened pen in a refrigerator between 2 and 8 degrees C (36 and 46 degrees F). Do not freeze or use if the medicine has been frozen. Protect from light and excessive heat. After you first use the pen, it can be stored at room temperature between 15 and 30 degrees C (59 and 86 degrees F) or in a refrigerator. Throw away your used pen after 30 days or after the expiration date, whichever comes  first. Do not store your pen with the needle attached. If the needle is left on, medicine may leak from the pen. NOTE: This sheet is a summary. It may not cover all possible information. If you have questions about this medicine, talk to your doctor, pharmacist, or health care provider.  2018 Elsevier/Gold Standard (2016-03-25 14:41:37)  

## 2017-03-21 NOTE — Telephone Encounter (Signed)
Form faxed for PA on saxenda

## 2017-03-21 NOTE — Progress Notes (Signed)
   Subjective:    Patient ID: Julie Johnston, female    DOB: 04/16/1970, 46 y.o.   MRN: [id number]  HPI Pt is a 46 yo pleasant female with MDD(severe) and on multiple medications that causes weight gain.  In April 2018 patient went off mental health medications due to weight gain. Her boyfriend broke up with her and then she had a suicide attempt. She has since restarted her medications but gained 25lbs since June. She tried ECT to see if she could lessen dose of weight gaining medications. She had 8 treatments with no benefit.  She comes in today to discuss weight gain and options. Never had any pancreatitis or personal/family hx of thyroid cancer.   .. Active Ambulatory Problems    Diagnosis Date Noted  . Lyme disease 05/09/2015  . Insomnia 05/09/2015  . Palpitations 05/09/2015  . Chronic fatigue 05/09/2015  . No energy 05/09/2015  . Hyperlipidemia 05/09/2015  . Abnormal Pap smear of cervix 05/09/2015  . Attention deficit hyperactivity disorder (ADHD), predominantly inattentive type 05/09/2015  . MDD (major depressive disorder), recurrent episode, moderate ([place]) 05/09/2015  . Pre-diabetes 05/14/2015  . Obstructive sleep apnea 06/18/2015  . Urine ketones 09/22/2015  . Fibromyalgia 09/22/2015  . Obesity 09/22/2015  . Drug-induced weight gain 02/23/2016   Resolved Ambulatory Problems    Diagnosis Date Noted  . No Resolved Ambulatory Problems   Past Medical History:  Diagnosis Date  . Depression   . Obesity (BMI 35.0-39.9 without comorbidity)       Review of Systems  All other systems reviewed and are negative.      Objective:   Physical Exam  Constitutional: She is oriented to person, place, and time. She appears well-developed and well-nourished.  Morbidly obese.   HENT:  Head: Normocephalic and atraumatic.  Cardiovascular: Normal rate, regular rhythm and normal heart sounds.  Neurological: She is alert and oriented to person, place, and time.  Psychiatric: She has  a normal mood and affect. Her behavior is normal.          Assessment & Plan:  [name] KitchenMarland KitchenNaba was seen today for obesity.  Diagnoses and all orders for this visit:  Morbid obesity ([place]) -     Liraglutide -Weight Management (SAXENDA) 18 MG/3ML SOPN; Inject 0.6 mg into the skin daily. For one week then increase by .6mg  weekly until reaches 3mg  daily.  Please include ultra fine needles 85mm -     Lipid Panel w/reflex Direct LDL -     Hemoglobin A1c -     COMPLETE METABOLIC PANEL WITH GFR  Screening for lipid disorders -     Lipid Panel w/reflex Direct LDL  Screening for diabetes mellitus -     Hemoglobin A1c  Severe episode of recurrent major depressive disorder, with psychotic features ([place])  Acquired hypothyroidism   MDD is managed by psychiatry. Pt denies any SI/HC.   Will check fasting labs and to screen for diabetes.   [name] Kitchen.Discussed low carb diet with 1500 calories and 80g of protein.  Exercising at least 150 minutes a week.  [employer] could be a Microbiologist.  Pt did not tolerate belviq.  She is not a candidate for contrave or phentermine.  Would like to try saxenda. Discussed side effects.  Pt aware of slow titration up.  Follow up in 1 month.

## 2017-03-22 LAB — LIPID PANEL W/REFLEX DIRECT LDL
CHOLESTEROL: 184 mg/dL (ref <200)
HDL: 39 mg/dL — AB (ref >50)
LDL Cholesterol (Calc): 105 mg/dL (calc) — ABNORMAL HIGH
Non-HDL Cholesterol (Calc): 145 mg/dL (calc) — ABNORMAL HIGH (ref <130)
Total CHOL/HDL Ratio: 4.7 (calc) (ref <5.0)
Triglycerides: 301 mg/dL — ABNORMAL HIGH (ref <150)

## 2017-03-22 LAB — COMPLETE METABOLIC PANEL WITH GFR
AG RATIO: 1.5 (calc) (ref 1.0–2.5)
ALT: 8 U/L (ref 6–29)
AST: 12 U/L (ref 10–35)
Albumin: 3.9 g/dL (ref 3.6–5.1)
Alkaline phosphatase (APISO): 74 U/L (ref 33–115)
BUN: 8 mg/dL (ref 7–25)
CALCIUM: 9.1 mg/dL (ref 8.6–10.2)
CO2: 25 mmol/L (ref 20–32)
Chloride: 106 mmol/L (ref 98–110)
Creat: 0.87 mg/dL (ref 0.50–1.10)
GFR, EST AFRICAN AMERICAN: 92 mL/min/{1.73_m2} (ref >=60)
GFR, EST NON AFRICAN AMERICAN: 79 mL/min/{1.73_m2} (ref >=60)
GLOBULIN: 2.6 g/dL (ref 1.9–3.7)
Glucose, Bld: 93 mg/dL (ref 65–99)
POTASSIUM: 4.2 mmol/L (ref 3.5–5.3)
SODIUM: 139 mmol/L (ref 135–146)
TOTAL PROTEIN: 6.5 g/dL (ref 6.1–8.1)
Total Bilirubin: 0.4 mg/dL (ref 0.2–1.2)

## 2017-03-22 LAB — HEMOGLOBIN A1C
HEMOGLOBIN A1C: 5.4 %{Hb} (ref <5.7)
MEAN PLASMA GLUCOSE: 108 (calc)
eAG (mmol/L): 6 (calc)

## 2017-03-23 ENCOUNTER — Encounter: Payer: Self-pay | Admitting: Physician Assistant

## 2017-03-23 NOTE — Telephone Encounter (Signed)
saxenda is a policy exclusion. Patient was notified. She states she spoke with her insurance and was told that a tier exception from could be filled out and faxed. I did tell her that if a medication is a policy exclusion then that means there is no criteria that has to be met nor is there any copayment amount that you can request to be lowered. It just means they simply do not cover it because they feel it's not a medical necessity. I asked if she can have them fax the form again so that I can see what exactly it is and go from there.  The denial letter I did received does state that certain injectable medications administered her by a doctor in the office  And lefter says the patient may pay for medications out of pocket.

## 2017-03-23 NOTE — Telephone Encounter (Signed)
Received fax on 03/23/17 stating Saxenda 18MG /3ML was denied. Fax placed in PA box.

## 2017-03-25 ENCOUNTER — Encounter: Payer: Self-pay | Admitting: Physician Assistant

## 2017-04-18 DIAGNOSIS — M7502 Adhesive capsulitis of left shoulder: Secondary | ICD-10-CM | POA: Insufficient documentation

## 2017-06-02 ENCOUNTER — Encounter: Payer: Self-pay | Admitting: Physician Assistant

## 2017-07-06 ENCOUNTER — Encounter: Payer: Self-pay | Admitting: Physician Assistant

## 2017-07-13 ENCOUNTER — Other Ambulatory Visit: Payer: Self-pay | Admitting: Obstetrics & Gynecology

## 2017-08-01 ENCOUNTER — Encounter: Payer: Self-pay | Admitting: Physician Assistant

## 2017-08-08 ENCOUNTER — Encounter: Payer: Self-pay | Admitting: Physician Assistant

## 2017-08-08 MED ORDER — LIRAGLUTIDE -WEIGHT MANAGEMENT 18 MG/3ML ~~LOC~~ SOPN: 0.6000 mg | PEN_INJECTOR | Freq: Every day | SUBCUTANEOUS | 2 refills | Status: DC

## 2017-08-29 ENCOUNTER — Encounter: Payer: Self-pay | Admitting: Physician Assistant

## 2017-11-29 ENCOUNTER — Ambulatory Visit: Payer: BLUE CROSS/BLUE SHIELD | Admitting: Sports Medicine

## 2017-11-29 ENCOUNTER — Encounter: Payer: Self-pay | Admitting: Sports Medicine

## 2017-11-29 DIAGNOSIS — G43801 Other migraine, not intractable, with status migrainosus: Secondary | ICD-10-CM

## 2017-11-29 DIAGNOSIS — G43909 Migraine, unspecified, not intractable, without status migrainosus: Secondary | ICD-10-CM

## 2017-11-29 DIAGNOSIS — R300 Dysuria: Secondary | ICD-10-CM

## 2017-11-29 HISTORY — DX: Migraine, unspecified, not intractable, without status migrainosus: G43.909

## 2017-11-29 LAB — POCT URINALYSIS DIPSTICK
Bilirubin, UA: NEGATIVE
Glucose, UA: NEGATIVE
Nitrite, UA: NEGATIVE
Protein, UA: POSITIVE — AB
Spec Grav, UA: 1.025 (ref 1.010–1.025)
Urobilinogen, UA: 0.2 E.U./dL
pH, UA: 6.5 (ref 5.0–8.0)

## 2017-11-29 MED ORDER — DEXAMETHASONE SODIUM PHOSPHATE 10 MG/ML IJ SOLN
10.0000 mg | Freq: Once | INTRAMUSCULAR | Status: AC
  Administered 2017-11-29: 10 mg via INTRAMUSCULAR

## 2017-11-29 MED ORDER — RIZATRIPTAN BENZOATE 10 MG PO TBDP: 10.0000 mg | ORAL_TABLET | ORAL | 3 refills | Status: DC | PRN

## 2017-11-29 MED ORDER — KETOROLAC TROMETHAMINE 30 MG/ML IJ SOLN
30.0000 mg | Freq: Once | INTRAMUSCULAR | Status: AC
  Administered 2017-11-29: 30 mg via INTRAMUSCULAR

## 2017-11-29 MED ORDER — CEPHALEXIN 500 MG PO CAPS: 500.0000 mg | ORAL_CAPSULE | Freq: Two times a day (BID) | ORAL | 0 refills | Status: DC

## 2017-11-29 NOTE — Assessment & Plan Note (Addendum)
With pyuria and trace hematuria. Adding Keflex, sending off for urine culture.  Multidrug-resistant E. coli, sensitive with a good MIC to levofloxacin, switching to this, discontinue Keflex.

## 2017-11-29 NOTE — Progress Notes (Addendum)
Subjective:    CC: Headaches  HPI: This is a very pleasant 47 year old female with a history of severe depression treated in the past with electroconvulsive therapy.  For the past several days she has had severe headaches, at least 2/week lasting several days, this is been going on for the past couple of months.  Headaches are severe, persistent, bifrontal without radiation, no throbbing, with moderate nausea,  and mild photophobia and photophobia.  No focal neurologic symptoms, no trauma, no constitutional symptoms or neck pain or stiffness.  In addition she is noted several days of urinary burning, frequency, urgency, feels like her typical UTIs in the past.  I reviewed the past medical history, family history, social history, surgical history, and allergies today and no changes were needed.  Please see the problem list section below in epic for further details.  Past Medical History: Past Medical History:  Diagnosis Date  . Depression   . Obesity (BMI 35.0-39.9 without comorbidity)    Past Surgical History: Past Surgical History:  Procedure Laterality Date  . CHOLECYSTECTOMY    . lumbar microdiskectomy     Social History: Social History   Socioeconomic History  . Marital status: Divorced    Spouse name: Not on file  . Number of children: Not on file  . Years of education: Not on file  . Highest education level: Not on file  Occupational History  . Not on file  Social Needs  . Financial resource strain: Not on file  . Food insecurity:    Worry: Not on file    Inability: Not on file  . Transportation needs:    Medical: Not on file    Non-medical: Not on file  Tobacco Use  . Smoking status: Former Research scientist (life sciences)  . Smokeless tobacco: Never Used  Substance and Sexual Activity  . Alcohol use: Yes    Alcohol/week: 0.0 standard drinks  . Drug use: No  . Sexual activity: Yes  Lifestyle  . Physical activity:    Days per week: Not on file    Minutes per session: Not on file    . Stress: Not on file  Relationships  . Social connections:    Talks on phone: Not on file    Gets together: Not on file    Attends religious service: Not on file    Active member of club or organization: Not on file    Attends meetings of clubs or organizations: Not on file    Relationship status: Not on file  Other Topics Concern  . Not on file  Social History Narrative  . Not on file   Family History: Family History  Problem Relation Age of Onset  . Hypertension Mother   . Alcohol abuse Father   . Heart attack Father   . Depression Father   . Parkinson's disease Father   . Aneurysm Maternal Grandfather   . Stroke Paternal Grandmother   . Heart attack Paternal Grandfather   . Cancer Neg Hx    Allergies: Allergies  Allergen Reactions  . Belviq [Lorcaserin Hcl]     Made eyes crossed.    Medications: See med rec.  Review of Systems: No fevers, chills, night sweats, weight loss, chest pain, or shortness of breath.   Objective:    General: Well Developed, well nourished, and in no acute distress.  Neuro: Alert and oriented x3, extra-ocular muscles intact, sensation grossly intact.  Cranial nerves II through XII are intact, motor, sensory, coordinative functions are all intact, no  finger dysmetria, no dysdiadochokinesis, negative Romberg sign. HEENT: Normocephalic, atraumatic, pupils equal round reactive to light, neck supple, no masses, no lymphadenopathy, thyroid nonpalpable.  Skin: Warm and dry, no rashes. Cardiac: Regular rate and rhythm, no murmurs rubs or gallops, no lower extremity edema.  Respiratory: Clear to auscultation bilaterally. Not using accessory muscles, speaking in full sentences. Abdomen: Soft, nontender, nondistended normal bowel sounds, no palpable masses, no guarding, rigidity, rebound tenderness.  Urinalysis is positive for blood and leukocytes.  Impression and Recommendations:    Migraine Classic migraine without aura. Normal neurologic  exam. Toradol 30, Decadron 10 intramuscular to abort the migraine. I am sending her home with Maxalt. She does have more than 4 migraine days per month so if in a couple of weeks she still having headaches we will add Emgality.  Dysuria With pyuria and trace hematuria. Adding Keflex, sending off for urine culture.  Multidrug-resistant E. coli, sensitive with a good MIC to levofloxacin, switching to this, discontinue Keflex. ___________________________________________ Gwen Her. Dianah Field, M.D., ABFM., CAQSM. Primary Care and [place] Instructor of [place] [facility]

## 2017-11-29 NOTE — Patient Instructions (Signed)
Look into Emgality.    Migraine Headache A migraine headache is an intense, throbbing pain on one side or both sides of the head. Migraines may also cause other symptoms, such as nausea, vomiting, and sensitivity to light and noise. What are the causes? Doing or taking certain things may also trigger migraines, such as:  Alcohol.  Smoking.  Medicines, such as: ? Medicine used to treat chest pain (nitroglycerine). ? Birth control pills. ? Estrogen pills. ? Certain blood pressure medicines.  Aged cheeses, chocolate, or caffeine.  Foods or drinks that contain nitrates, glutamate, aspartame, or tyramine.  Physical activity.  Other things that may trigger a migraine include:  Menstruation.  Pregnancy.  Hunger.  Stress, lack of sleep, too much sleep, or fatigue.  Weather changes.  What increases the risk? The following factors may make you more likely to experience migraine headaches:  Age. Risk increases with age.  Family history of migraine headaches.  Being Caucasian.  Depression and anxiety.  Obesity.  Being a woman.  Having a hole in the heart (patent foramen ovale) or other heart problems.  What are the signs or symptoms? The main symptom of this condition is pulsating or throbbing pain. Pain may:  Happen in any area of the head, such as on one side or both sides.  Interfere with daily activities.  Get worse with physical activity.  Get worse with exposure to bright lights or loud noises.  Other symptoms may include:  Nausea.  Vomiting.  Dizziness.  General sensitivity to bright lights, loud noises, or smells.  Before you get a migraine, you may get warning signs that a migraine is developing (aura). An aura may include:  Seeing flashing lights or having blind spots.  Seeing bright spots, halos, or zigzag lines.  Having tunnel vision or blurred vision.  Having numbness or a tingling feeling.  Having trouble talking.  Having muscle  weakness.  How is this diagnosed? A migraine headache can be diagnosed based on:  Your symptoms.  A physical exam.  Tests, such as CT scan or MRI of the head. These imaging tests can help rule out other causes of headaches.  Taking fluid from the spine (lumbar puncture) and analyzing it (cerebrospinal fluid analysis, or CSF analysis).  How is this treated? A migraine headache is usually treated with medicines that:  Relieve pain.  Relieve nausea.  Prevent migraines from coming back.  Treatment may also include:  Acupuncture.  Lifestyle changes like avoiding foods that trigger migraines.  Follow these instructions at home: Medicines  Take over-the-counter and prescription medicines only as told by your health care provider.  Do not drive or use heavy machinery while taking prescription pain medicine.  To prevent or treat constipation while you are taking prescription pain medicine, your health care provider may recommend that you: ? Drink enough fluid to keep your urine clear or pale yellow. ? Take over-the-counter or prescription medicines. ? Eat foods that are high in fiber, such as fresh fruits and vegetables, whole grains, and beans. ? Limit foods that are high in fat and processed sugars, such as fried and sweet foods. Lifestyle  Avoid alcohol use.  Do not use any products that contain nicotine or tobacco, such as cigarettes and e-cigarettes. If you need help quitting, ask your health care provider.  Get at least 8 hours of sleep every night.  Limit your stress. General instructions   Keep a journal to find out what may trigger your migraine headaches. For example, write  down: ? What you eat and drink. ? How much sleep you get. ? Any change to your diet or medicines.  If you have a migraine: ? Avoid things that make your symptoms worse, such as bright lights. ? It may help to lie down in a dark, quiet room. ? Do not drive or use heavy machinery. ? Ask  your health care provider what activities are safe for you while you are experiencing symptoms.  Keep all follow-up visits as told by your health care provider. This is important. Contact a health care provider if:  You develop symptoms that are different or more severe than your usual migraine symptoms. Get help right away if:  Your migraine becomes severe.  You have a fever.  You have a stiff neck.  You have vision loss.  Your muscles feel weak or like you cannot control them.  You start to lose your balance often.  You develop trouble walking.  You faint. This information is not intended to replace advice given to you by your health care provider. Make sure you discuss any questions you have with your health care provider. Document Released: 03/08/2005 Document Revised: 09/26/2015 Document Reviewed: 08/25/2015 Elsevier Interactive Patient Education  2017 [employer].

## 2017-11-29 NOTE — Assessment & Plan Note (Signed)
Classic migraine without aura. Normal neurologic exam. Toradol 30, Decadron 10 intramuscular to abort the migraine. I am sending her home with Maxalt. She does have more than 4 migraine days per month so if in a couple of weeks she still having headaches we will add Emgality.

## 2017-12-01 LAB — URINE CULTURE
MICRO NUMBER:: 91084757
SPECIMEN QUALITY:: ADEQUATE

## 2017-12-02 ENCOUNTER — Other Ambulatory Visit: Payer: Self-pay | Admitting: *Deleted

## 2017-12-02 MED ORDER — LEVOFLOXACIN 750 MG PO TABS: 750.0000 mg | ORAL_TABLET | Freq: Every day | ORAL | 0 refills | Status: DC

## 2017-12-02 MED ORDER — FLUCONAZOLE 150 MG PO TABS: 150.0000 mg | ORAL_TABLET | Freq: Once | ORAL | 1 refills | Status: AC

## 2017-12-02 NOTE — Addendum Note (Signed)
Addended by: Silverio Decamp on: 12/02/2017 08:35 AM   Modules accepted: Orders

## 2017-12-15 ENCOUNTER — Ambulatory Visit: Payer: BLUE CROSS/BLUE SHIELD | Admitting: Sports Medicine

## 2017-12-16 ENCOUNTER — Ambulatory Visit (INDEPENDENT_AMBULATORY_CARE_PROVIDER_SITE_OTHER): Payer: BLUE CROSS/BLUE SHIELD | Admitting: Sports Medicine

## 2017-12-16 ENCOUNTER — Encounter: Payer: Self-pay | Admitting: Sports Medicine

## 2017-12-16 DIAGNOSIS — Z202 Contact with and (suspected) exposure to infections with a predominantly sexual mode of transmission: Secondary | ICD-10-CM | POA: Insufficient documentation

## 2017-12-16 DIAGNOSIS — G43801 Other migraine, not intractable, with status migrainosus: Secondary | ICD-10-CM | POA: Diagnosis not present

## 2017-12-16 DIAGNOSIS — R509 Fever, unspecified: Secondary | ICD-10-CM

## 2017-12-16 DIAGNOSIS — J101 Influenza due to other identified influenza virus with other respiratory manifestations: Secondary | ICD-10-CM | POA: Insufficient documentation

## 2017-12-16 MED ORDER — OSELTAMIVIR PHOSPHATE 75 MG PO CAPS: 75.0000 mg | ORAL_CAPSULE | Freq: Two times a day (BID) | ORAL | 0 refills | Status: DC

## 2017-12-16 NOTE — Progress Notes (Signed)
Subjective:    CC: Feeling sick  HPI: For the past day this pleasant 47 year old female has had muscle aches, body aches, fevers, chills.  Mild cough, sore throat, has not yet had her flu shot this year.  In addition she has had an exposure to STDs, and would like to be tested.  No genital symptoms, she does have the above viral symptoms.  I reviewed the past medical history, family history, social history, surgical history, and allergies today and no changes were needed.  Please see the problem list section below in epic for further details.  Past Medical History: Past Medical History:  Diagnosis Date  . Depression   . Obesity (BMI 35.0-39.9 without comorbidity)    Past Surgical History: Past Surgical History:  Procedure Laterality Date  . CHOLECYSTECTOMY    . lumbar microdiskectomy     Social History: Social History   Socioeconomic History  . Marital status: Divorced    Spouse name: Not on file  . Number of children: Not on file  . Years of education: Not on file  . Highest education level: Not on file  Occupational History  . Not on file  Social Needs  . Financial resource strain: Not on file  . Food insecurity:    Worry: Not on file    Inability: Not on file  . Transportation needs:    Medical: Not on file    Non-medical: Not on file  Tobacco Use  . Smoking status: Former Research scientist (life sciences)  . Smokeless tobacco: Never Used  Substance and Sexual Activity  . Alcohol use: Yes    Alcohol/week: 0.0 standard drinks  . Drug use: No  . Sexual activity: Yes  Lifestyle  . Physical activity:    Days per week: Not on file    Minutes per session: Not on file  . Stress: Not on file  Relationships  . Social connections:    Talks on phone: Not on file    Gets together: Not on file    Attends religious service: Not on file    Active member of club or organization: Not on file    Attends meetings of clubs or organizations: Not on file    Relationship status: Not on file  Other  Topics Concern  . Not on file  Social History Narrative  . Not on file   Family History: Family History  Problem Relation Age of Onset  . Hypertension Mother   . Alcohol abuse Father   . Heart attack Father   . Depression Father   . Parkinson's disease Father   . Aneurysm Maternal Grandfather   . Stroke Paternal Grandmother   . Heart attack Paternal Grandfather   . Cancer Neg Hx    Allergies: Allergies  Allergen Reactions  . Belviq [Lorcaserin Hcl]     Made eyes crossed.    Medications: See med rec.  Review of Systems: No fevers, chills, night sweats, weight loss, chest pain, or shortness of breath.   Objective:    General: Well Developed, well nourished, and in no acute distress.  Neuro: Alert and oriented x3, extra-ocular muscles intact, sensation grossly intact.  HEENT: Normocephalic, atraumatic, pupils equal round reactive to light, neck supple, no masses, no lymphadenopathy, thyroid nonpalpable.  Oropharynx, nasopharynx, ear canals unremarkable. Skin: Warm and dry, no rashes. Cardiac: Regular rate and rhythm, no murmurs rubs or gallops, no lower extremity edema.  Respiratory: Clear to auscultation bilaterally. Not using accessory muscles, speaking in full sentences.  Impression and Recommendations:  Fever and chills With myalgias, likely viral in nature. Flu swab came back invalid. Adding serum influenza viral titers. CBC, mono testing, HIV testing. Treating empirically with Tamiflu.   Migraine Maxalt worked very well. She is having a headache now but this is likely from her viral syndrome. If has more than 4 migraine days per month we can add Emgality. Further follow-up with PCP for this.  Potential exposure to STD In addition to HIV adding a full STD panel. She did have a couple of potential STD exposures.  ___________________________________________ Gwen Her. Dianah Field, M.D., ABFM., CAQSM. Primary Care and [employer] Instructor of [employer] [facility]

## 2017-12-16 NOTE — Assessment & Plan Note (Signed)
Maxalt worked very well. She is having a headache now but this is likely from her viral syndrome. If has more than 4 migraine days per month we can add Emgality. Further follow-up with PCP for this.

## 2017-12-16 NOTE — Assessment & Plan Note (Addendum)
In addition to HIV adding a full STD panel. She did have a couple of potential STD exposures.

## 2017-12-16 NOTE — Assessment & Plan Note (Addendum)
With myalgias, likely viral in nature. Flu swab came back invalid. Adding serum influenza viral titers. CBC, mono testing, HIV testing. Treating empirically with Tamiflu.

## 2017-12-20 LAB — CBC WITH DIFFERENTIAL/PLATELET
Basophils Absolute: 28 {cells}/uL (ref 0–200)
Basophils Relative: 0.3 %
Eosinophils Absolute: 112 cells/uL (ref 15–500)
Eosinophils Relative: 1.2 %
HCT: 39.4 % (ref 35.0–45.0)
Hemoglobin: 13.3 g/dL (ref 11.7–15.5)
Lymphs Abs: 1060 cells/uL (ref 850–3900)
MCH: 29.8 pg (ref 27.0–33.0)
MCHC: 33.8 g/dL (ref 32.0–36.0)
MCV: 88.3 fL (ref 80.0–100.0)
MPV: 11.3 fL (ref 7.5–12.5)
Monocytes Relative: 5.4 %
Neutro Abs: 7598 cells/uL (ref 1500–7800)
Neutrophils Relative %: 81.7 %
Platelets: 265 10*3/uL (ref 140–400)
RBC: 4.46 10*6/uL (ref 3.80–5.10)
RDW: 13.1 % (ref 11.0–15.0)
Total Lymphocyte: 11.4 %
WBC mixed population: 502 {cells}/uL (ref 200–950)
WBC: 9.3 10*3/uL (ref 3.8–10.8)

## 2017-12-20 LAB — HEPATITIS PANEL, ACUTE
Hep A IgM: NONREACTIVE
Hep B C IgM: NONREACTIVE
Hepatitis B Surface Ag: NONREACTIVE
Hepatitis C Ab: NONREACTIVE
SIGNAL TO CUT-OFF: 0.02 (ref <1.00)

## 2017-12-20 LAB — RPR: RPR Ser Ql: NONREACTIVE

## 2017-12-20 LAB — COMPREHENSIVE METABOLIC PANEL
AG Ratio: 1.7 (calc) (ref 1.0–2.5)
ALT: 12 U/L (ref 6–29)
AST: 18 U/L (ref 10–35)
CO2: 23 mmol/L (ref 20–32)
Calcium: 9.1 mg/dL (ref 8.6–10.2)
Chloride: 102 mmol/L (ref 98–110)
Globulin: 2.4 g/dL (calc) (ref 1.9–3.7)
Sodium: 136 mmol/L (ref 135–146)
Total Bilirubin: 0.4 mg/dL (ref 0.2–1.2)

## 2017-12-20 LAB — COMPREHENSIVE METABOLIC PANEL WITH GFR
Albumin: 4 g/dL (ref 3.6–5.1)
Alkaline phosphatase (APISO): 73 U/L (ref 33–115)
BUN: 7 mg/dL (ref 7–25)
Creat: 0.82 mg/dL (ref 0.50–1.10)
Glucose, Bld: 78 mg/dL (ref 65–99)
Potassium: 3.9 mmol/L (ref 3.5–5.3)
Total Protein: 6.4 g/dL (ref 6.1–8.1)

## 2017-12-20 LAB — EPSTEIN-BARR VIRUS VCA, IGM: EBV VCA IgM: <36 U/mL

## 2017-12-20 LAB — EPSTEIN-BARR VIRUS VCA, IGG: EBV VCA IgG: >750 U/mL — ABNORMAL HIGH

## 2017-12-20 LAB — HSV 2 ANTIBODY, IGG: HSV 2 Glycoprotein G Ab, IgG: <0.9 index

## 2017-12-20 LAB — C. TRACHOMATIS/N. GONORRHOEAE RNA
C. trachomatis RNA, TMA: NOT DETECTED
N. gonorrhoeae RNA, TMA: NOT DETECTED

## 2017-12-20 LAB — CMV IGM: CMV IgM: <30 AU/mL

## 2017-12-21 ENCOUNTER — Encounter: Payer: Self-pay | Admitting: Physician Assistant

## 2017-12-21 ENCOUNTER — Ambulatory Visit: Payer: BLUE CROSS/BLUE SHIELD | Admitting: Physician Assistant

## 2017-12-21 VITALS — BP 105/59 | HR 91 | Temp 97.5°F | Ht 65.0 in | Wt 246.0 lb

## 2017-12-21 DIAGNOSIS — J209 Acute bronchitis, unspecified: Secondary | ICD-10-CM

## 2017-12-21 DIAGNOSIS — Z87898 Personal history of other specified conditions: Secondary | ICD-10-CM

## 2017-12-21 MED ORDER — BENZONATATE 200 MG PO CAPS: 200.0000 mg | ORAL_CAPSULE | Freq: Two times a day (BID) | ORAL | 0 refills | Status: DC | PRN

## 2017-12-21 MED ORDER — METHYLPREDNISOLONE SODIUM SUCC 125 MG IJ SOLR
125.0000 mg | Freq: Once | INTRAMUSCULAR | Status: AC
  Administered 2017-12-21: 125 mg via INTRAMUSCULAR

## 2017-12-21 MED ORDER — SCOPOLAMINE 1 MG/3DAYS TD PT72: 1.0000 | MEDICATED_PATCH | TRANSDERMAL | 0 refills | Status: DC

## 2017-12-21 NOTE — Progress Notes (Signed)
   Subjective:    Patient ID: Julie Johnston, female    DOB: June 17, 1970, 47 y.o.   MRN: [id number]  HPI Pt is a 47 yo female who presents to the clinic to follow up on flu like symptoms that she was experiencing. She finished tamiflu and does feel better. She continues to cough and concerned because she is traveling to [place] in the next week and wants to feel better.  No fever. She does have some upper back and neck tightness. Her cough is productive. No wheezing or SOb. She does feel like her sinus pressure and ear pain is better. Throat is still a little sore but she feels better.   Pt request patches for motion sicknes.   .. Active Ambulatory Problems    Diagnosis Date Noted  . Lyme disease 05/09/2015  . Insomnia 05/09/2015  . Palpitations 05/09/2015  . Chronic fatigue 05/09/2015  . No energy 05/09/2015  . Hyperlipidemia 05/09/2015  . Abnormal Pap smear of cervix 05/09/2015  . Attention deficit hyperactivity disorder (ADHD), predominantly inattentive type 05/09/2015  . MDD (major depressive disorder), recurrent episode, moderate ([place]) 05/09/2015  . Pre-diabetes 05/14/2015  . Obstructive sleep apnea 06/18/2015  . Urine ketones 09/22/2015  . Fibromyalgia 09/22/2015  . Obesity 09/22/2015  . Drug-induced weight gain 02/23/2016  . Migraine 11/29/2017  . Dysuria 11/29/2017  . Fever and chills 12/16/2017  . Potential exposure to STD 12/16/2017   Resolved Ambulatory Problems    Diagnosis Date Noted  . No Resolved Ambulatory Problems   Past Medical History:  Diagnosis Date  . Depression   . Obesity (BMI 35.0-39.9 without comorbidity)      Review of Systems  All other systems reviewed and are negative.      Objective:   Physical Exam  Constitutional: She is oriented to person, place, and time. She appears well-developed and well-nourished.  HENT:  Head: Normocephalic and atraumatic.  Right Ear: External ear normal.  Left Ear: External ear normal.  Nose: Nose normal.   Oropharynx erythematous.  TM's clear.   Eyes: Pupils are equal, round, and reactive to light. Conjunctivae and EOM are normal.  Neck: Normal range of motion. Neck supple.  Cardiovascular: Normal rate and regular rhythm.  Pulmonary/Chest: Effort normal and breath sounds normal.  Lymphadenopathy:    She has cervical adenopathy.  Neurological: She is alert and oriented to person, place, and time.  Skin: She is diaphoretic.  Psychiatric: She has a normal mood and affect. Her behavior is normal.          Assessment & Plan:  [name] KitchenMarland KitchenDiagnoses and all orders for this visit:  Acute bronchitis, unspecified organism -     benzonatate (TESSALON) 200 MG capsule; Take 1 capsule (200 mg total) by mouth 2 (two) times daily as needed for cough. -     methylPREDNISolone sodium succinate (SOLU-MEDROL) 125 mg/2 mL injection 125 mg  H/O motion sickness -     scopolamine (TRANSDERM-SCOP, 1.5 MG,) 1 MG/3DAYS; Place 1 patch (1.5 mg total) onto the skin every 3 (three) days.  pt seems to be feeling better. Cough is still persistent. Tessalon pearls for cough. Given shot of solumedrol for cough/inflammation. If not improving or worsening please call so I can send over abx before your trip. HO given.

## 2017-12-21 NOTE — Progress Notes (Signed)
105/59  

## 2017-12-21 NOTE — Patient Instructions (Signed)

## 2017-12-22 ENCOUNTER — Encounter: Payer: Self-pay | Admitting: Physician Assistant

## 2017-12-22 LAB — INFLUENZA A & B ANTIBODIES
INFLUENZA TYPE A ANTIBODY SERUM: 1:8 {titer} — ABNORMAL HIGH
INFLUENZA TYPE B ANTIBODY SERUM: 1:8 {titer} — ABNORMAL HIGH

## 2017-12-23 ENCOUNTER — Encounter: Payer: Self-pay | Admitting: Sports Medicine

## 2017-12-27 ENCOUNTER — Ambulatory Visit: Payer: BLUE CROSS/BLUE SHIELD | Admitting: Sports Medicine

## 2018-02-01 ENCOUNTER — Encounter: Payer: Self-pay | Admitting: Physician Assistant

## 2018-02-01 MED ORDER — VALACYCLOVIR HCL 1 G PO TABS: 2000.0000 mg | ORAL_TABLET | Freq: Two times a day (BID) | ORAL | 2 refills | Status: AC

## 2018-02-01 MED ORDER — POLYMYXIN B-TRIMETHOPRIM 10000-0.1 UNIT/ML-% OP SOLN: 1.0000 [drp] | OPHTHALMIC | 0 refills | Status: DC

## 2018-03-24 ENCOUNTER — Other Ambulatory Visit: Payer: Self-pay | Admitting: Physician Assistant

## 2018-03-28 ENCOUNTER — Other Ambulatory Visit: Payer: Self-pay | Admitting: Physician Assistant

## 2018-04-07 ENCOUNTER — Other Ambulatory Visit: Payer: Self-pay | Admitting: Obstetrics & Gynecology

## 2018-04-10 ENCOUNTER — Telehealth: Payer: Self-pay | Admitting: Physician Assistant

## 2018-04-10 ENCOUNTER — Ambulatory Visit (INDEPENDENT_AMBULATORY_CARE_PROVIDER_SITE_OTHER): Payer: BLUE CROSS/BLUE SHIELD | Admitting: Family Medicine

## 2018-04-10 ENCOUNTER — Encounter: Payer: Self-pay | Admitting: Family Medicine

## 2018-04-10 VITALS — BP 126/63 | HR 86 | Temp 98.6°F | Ht 65.0 in | Wt 239.0 lb

## 2018-04-10 DIAGNOSIS — R5383 Other fatigue: Secondary | ICD-10-CM | POA: Diagnosis not present

## 2018-04-10 DIAGNOSIS — J039 Acute tonsillitis, unspecified: Secondary | ICD-10-CM | POA: Diagnosis not present

## 2018-04-10 DIAGNOSIS — B999 Unspecified infectious disease: Secondary | ICD-10-CM

## 2018-04-10 DIAGNOSIS — N3000 Acute cystitis without hematuria: Secondary | ICD-10-CM

## 2018-04-10 MED ORDER — ONDANSETRON 4 MG PO TBDP
4.00 | ORAL_TABLET | ORAL | Status: DC
Start: ? — End: 2018-04-10

## 2018-04-10 MED ORDER — LACTATED RINGERS IV SOLN
INTRAVENOUS | Status: DC
Start: ? — End: 2018-04-10

## 2018-04-10 MED ORDER — ACETAMINOPHEN 325 MG PO TABS
650.00 | ORAL_TABLET | ORAL | Status: DC
Start: 2018-04-09 — End: 2018-04-10

## 2018-04-10 NOTE — Telephone Encounter (Signed)
Call pt: if she is feeling better could wait until Wednesday. If not improving consider getting her in with one of my partners today.

## 2018-04-10 NOTE — Telephone Encounter (Signed)
Patient does not feel better and would like to be seen today. Scheduled a hospital follow up with Dr.Metheney. No further questions at this time.

## 2018-04-10 NOTE — Progress Notes (Signed)
Acute Office Visit  Subjective:    Patient ID: Julie Johnston, female    DOB: 18-May-1970, 48 y.o.   MRN: [id number]  Chief Complaint  Patient presents with  . Follow-up    was seen at [facility]   HPI Patient is in today for tonsillitis. Went to [facility] and tested negative for flu and strep. Was told to go to the ED and went to [facility]. + sweating, but no fever.  Still fatigued and Sore throat.  They did do a viral culture and it was negative.  She also had a urinalysis done which showed possible UTI with bacteria so they treated her with Keflex.  While in the ED they gave her Rocephin and Depo-Medrol through IV.  She says yesterday she could barely swallow her own saliva but today is actually feeling a little better she was at least able to eat a little bit of fruit.  She does travel a lot for her job.  She is concerned because she is had multiple infections in a very short period of time.  This past fall she has had the flu twice as well as sinusitis bronchitis and now with tonsillitis with exudate and fever.  She is worried that something could be wrong with her immune system.  She admits her diet is not the best.  Past Medical History:  Diagnosis Date  . Depression   . Obesity (BMI 35.0-39.9 without comorbidity)     Past Surgical History:  Procedure Laterality Date  . CHOLECYSTECTOMY    . lumbar microdiskectomy      Family History  Problem Relation Age of Onset  . Hypertension Mother   . Alcohol abuse Father   . Heart attack Father   . Depression Father   . Parkinson's disease Father   . Aneurysm Maternal Grandfather   . Stroke Paternal Grandmother   . Heart attack Paternal Grandfather   . Cancer Neg Hx     Social History   Socioeconomic History  . Marital status: Divorced    Spouse name: Not on file  . Number of children: Not on file  . Years of education: Not on file  . Highest education level: Not on file  Occupational History  . Not on file  Social Needs   . Financial resource strain: Not on file  . Food insecurity:    Worry: Not on file    Inability: Not on file  . Transportation needs:    Medical: Not on file    Non-medical: Not on file  Tobacco Use  . Smoking status: Former Research scientist (life sciences)  . Smokeless tobacco: Never Used  Substance and Sexual Activity  . Alcohol use: Yes    Alcohol/week: 0.0 standard drinks  . Drug use: No  . Sexual activity: Yes  Lifestyle  . Physical activity:    Days per week: Not on file    Minutes per session: Not on file  . Stress: Not on file  Relationships  . Social connections:    Talks on phone: Not on file    Gets together: Not on file    Attends religious service: Not on file    Active member of club or organization: Not on file    Attends meetings of clubs or organizations: Not on file    Relationship status: Not on file  . Intimate partner violence:    Fear of current or ex partner: Not on file    Emotionally abused: Not on file  Physically abused: Not on file    Forced sexual activity: Not on file  Other Topics Concern  . Not on file  Social History Narrative  . Not on file    Outpatient Medications Prior to Visit  Medication Sig Dispense Refill  . buPROPion (WELLBUTRIN XL) 300 MG 24 hr tablet Take 300 mg by mouth daily.    . cephALEXin (KEFLEX) 500 MG capsule Take by mouth.    . gabapentin (NEURONTIN) 600 MG tablet TAKE 1/2 TO 1 TABLET BY MOUTH EVERY 8 HOURS  3  . JUNEL FE 1/20 1-20 MG-MCG tablet TAKE A ACTIVE PILL CONTINUOUSLY. SHE WILL NEED TO PICK UP PACK SOONER THAN USUAL. 84 tablet 4  . lamoTRIgine (LAMICTAL) 200 MG tablet Take 200 mg by mouth daily.  3  . liothyronine (CYTOMEL) 50 MCG tablet Take 50 mcg by mouth daily.  3  . lithium carbonate (LITHOBID) 300 MG CR tablet Take 300 mg by mouth at bedtime.  3  . rizatriptan (MAXALT-MLT) 10 MG disintegrating tablet Take 1 tablet (10 mg total) by mouth as needed for migraine. May repeat in 2 hours if needed 10 tablet 3  . traZODone  (DESYREL) 50 MG tablet Take 50 mg by mouth at bedtime.    [name] Kitchen trimethoprim-polymyxin b (POLYTRIM) ophthalmic solution Place 1 drop into the right eye every 4 (four) hours. For 7 days. 10 mL 0  . valACYclovir (VALTREX) 1000 MG tablet Take 2 tablets (2,000 mg total) by mouth 2 (two) times daily. For one day. 14 tablet 2  . benzonatate (TESSALON) 200 MG capsule Take 1 capsule (200 mg total) by mouth 2 (two) times daily as needed for cough. 20 capsule 0  . scopolamine (TRANSDERM-SCOP, 1.5 MG,) 1 MG/3DAYS Place 1 patch (1.5 mg total) onto the skin every 3 (three) days. 4 patch 0   No facility-administered medications prior to visit.     Allergies  Allergen Reactions  . Belviq [Lorcaserin Hcl]     Made eyes crossed.     ROS     Objective:    Physical Exam  Constitutional: She is oriented to person, place, and time. She appears well-developed and well-nourished.  HENT:  Head: Normocephalic and atraumatic.  Right Ear: External ear normal.  Left Ear: External ear normal.  Nose: Nose normal.  TMs and canals are clear.  Also swollen with white exudate.  Eyes: Pupils are equal, round, and reactive to light. Conjunctivae and EOM are normal.  Neck: Neck supple. No thyromegaly present.  Cardiovascular: Normal rate, regular rhythm and normal heart sounds.  Pulmonary/Chest: Effort normal and breath sounds normal. She has no wheezes.  Lymphadenopathy:    She has no cervical adenopathy.  Neurological: She is alert and oriented to person, place, and time.  Skin: Skin is warm and dry.  Psychiatric: She has a normal mood and affect.    BP 126/63   Pulse 86   Temp 98.6 F (37 C)   Ht 5\' 5"  (1.651 m)   Wt 239 lb (108.4 kg)   SpO2 99%   BMI 39.77 kg/m  Wt Readings from Last 3 Encounters:  04/10/18 239 lb (108.4 kg)  12/21/17 246 lb (111.6 kg)  11/29/17 242 lb (109.8 kg)    Health Maintenance Due  Topic Date Due  . HIV Screening  02/15/1985    There are no preventive care reminders to  display for this patient.   Lab Results  Component Value Date   TSH 1.12 05/09/2015   Lab  Results  Component Value Date   WBC 9.3 12/16/2017   HGB 13.3 12/16/2017   HCT 39.4 12/16/2017   MCV 88.3 12/16/2017   PLT 265 12/16/2017   Lab Results  Component Value Date   NA 136 12/16/2017   K 3.9 12/16/2017   CO2 23 12/16/2017   GLUCOSE 78 12/16/2017   BUN 7 12/16/2017   CREATININE 0.82 12/16/2017   BILITOT 0.4 12/16/2017   ALKPHOS 92 05/09/2015   AST 18 12/16/2017   ALT 12 12/16/2017   PROT 6.4 12/16/2017   ALBUMIN 4.3 05/09/2015   CALCIUM 9.1 12/16/2017   Lab Results  Component Value Date   CHOL 184 03/21/2017   Lab Results  Component Value Date   HDL 39 (L) 03/21/2017   Lab Results  Component Value Date   LDLCALC 105 (H) 03/21/2017   Lab Results  Component Value Date   TRIG 301 (H) 03/21/2017   Lab Results  Component Value Date   CHOLHDL 4.7 03/21/2017   Lab Results  Component Value Date   HGBA1C 5.4 03/21/2017       Assessment & Plan:   Problem List Items Addressed This Visit    None    Visit Diagnoses    Tonsillitis    -  Primary   Relevant Orders   COMPLETE METABOLIC PANEL WITH GFR   CBC with Differential/Platelet   TSH   VITAMIN D 25 Hydroxy (Vit-D Deficiency, Fractures)   RPR   HIV Antibody (routine testing w rflx)   Ferritin   Vitamin B12   Fatigue, unspecified type       Relevant Orders   COMPLETE METABOLIC PANEL WITH GFR   CBC with Differential/Platelet   TSH   VITAMIN D 25 Hydroxy (Vit-D Deficiency, Fractures)   RPR   HIV Antibody (routine testing w rflx)   Ferritin   Vitamin B12   Recurrent infections       Relevant Medications   cephALEXin (KEFLEX) 500 MG capsule   Other Relevant Orders   COMPLETE METABOLIC PANEL WITH GFR   CBC with Differential/Platelet   TSH   VITAMIN D 25 Hydroxy (Vit-D Deficiency, Fractures)   RPR   HIV Antibody (routine testing w rflx)   Ferritin   Vitamin B12   Acute cystitis without  hematuria         Fatigue and recurrent infections plan to recheck her CBC.  Her white count was elevated about 19,000 in emergency department.  Also check for nutritional deficiencies such as iron and B12 which could make her more susceptible to infections.  She also request to have an HIV test done.  We will also check RPR.  UTI-make sure to complete Keflex.  When she has completed the antibiotics and has been off for a week I would like for her to come back in for repeat urinalysis to make sure that this is cleared as this is also the second UTI in the last couple of months.  No orders of the defined types were placed in this encounter.    Beatrice Lecher, MD

## 2018-04-10 NOTE — Telephone Encounter (Signed)
Routing to covering Provider.

## 2018-04-10 NOTE — Telephone Encounter (Signed)
Patient was seen at [place] on Saturday and was discharged yesterday. Was treated for high fever and swollen throat. They told her she needs to be seen today with PCP. PCP is not in. Please advise.

## 2018-04-11 ENCOUNTER — Encounter: Payer: Self-pay | Admitting: Family Medicine

## 2018-04-11 LAB — COMPLETE METABOLIC PANEL WITH GFR
AG Ratio: 1.3 (calc) (ref 1.0–2.5)
ALBUMIN MSPROF: 3.7 g/dL (ref 3.6–5.1)
ALT: 10 U/L (ref 6–29)
AST: 11 U/L (ref 10–35)
Alkaline phosphatase (APISO): 81 U/L (ref 33–115)
BUN: 11 mg/dL (ref 7–25)
CO2: 26 mmol/L (ref 20–32)
Calcium: 9 mg/dL (ref 8.6–10.2)
Chloride: 108 mmol/L (ref 98–110)
Creat: 0.75 mg/dL (ref 0.50–1.10)
GFR, EST NON AFRICAN AMERICAN: 94 mL/min/{1.73_m2} (ref >=60)
GFR, Est African American: 109 mL/min/{1.73_m2} (ref >=60)
GLOBULIN: 2.8 g/dL (ref 1.9–3.7)
Glucose, Bld: 94 mg/dL (ref 65–99)
Potassium: 4 mmol/L (ref 3.5–5.3)
SODIUM: 143 mmol/L (ref 135–146)
Total Bilirubin: 0.3 mg/dL (ref 0.2–1.2)
Total Protein: 6.5 g/dL (ref 6.1–8.1)

## 2018-04-11 LAB — CBC WITH DIFFERENTIAL/PLATELET
Absolute Monocytes: 720 cells/uL (ref 200–950)
BASOS ABS: 15 {cells}/uL (ref 0–200)
Basophils Relative: 0.1 %
Eosinophils Absolute: 15 cells/uL (ref 15–500)
Eosinophils Relative: 0.1 %
HCT: 39.9 % (ref 35.0–45.0)
Hemoglobin: 13.8 g/dL (ref 11.7–15.5)
Lymphs Abs: 2940 cells/uL (ref 850–3900)
MCH: 30.1 pg (ref 27.0–33.0)
MCHC: 34.6 g/dL (ref 32.0–36.0)
MCV: 86.9 fL (ref 80.0–100.0)
MPV: 11.1 fL (ref 7.5–12.5)
Monocytes Relative: 4.8 %
Neutro Abs: 11310 cells/uL — ABNORMAL HIGH (ref 1500–7800)
Neutrophils Relative %: 75.4 %
Platelets: 316 10*3/uL (ref 140–400)
RBC: 4.59 10*6/uL (ref 3.80–5.10)
RDW: 13.2 % (ref 11.0–15.0)
Total Lymphocyte: 19.6 %
WBC: 15 10*3/uL — ABNORMAL HIGH (ref 3.8–10.8)

## 2018-04-11 LAB — RPR: RPR Ser Ql: NONREACTIVE

## 2018-04-11 LAB — HIV ANTIBODY (ROUTINE TESTING W REFLEX): HIV 1&2 Ab, 4th Generation: NONREACTIVE

## 2018-04-11 LAB — VITAMIN B12: Vitamin B-12: 360 pg/mL (ref 200–1100)

## 2018-04-11 LAB — FERRITIN: Ferritin: 169 ng/mL (ref 16–232)

## 2018-04-11 LAB — VITAMIN D 25 HYDROXY (VIT D DEFICIENCY, FRACTURES): Vit D, 25-Hydroxy: 21 ng/mL — ABNORMAL LOW (ref 30–100)

## 2018-04-11 LAB — TSH: TSH: 0.23 mIU/L — ABNORMAL LOW

## 2018-04-12 ENCOUNTER — Other Ambulatory Visit: Payer: Self-pay

## 2018-04-12 ENCOUNTER — Emergency Department
Admission: EM | Admit: 2018-04-12 | Discharge: 2018-04-12 | Disposition: A | Discharge status: Home / Self Care | Payer: BLUE CROSS/BLUE SHIELD | Source: Home / Self Care | Attending: Family Medicine | Admitting: Family Medicine

## 2018-04-12 ENCOUNTER — Encounter: Payer: Self-pay | Admitting: *Deleted

## 2018-04-12 DIAGNOSIS — R531 Weakness: Secondary | ICD-10-CM

## 2018-04-12 DIAGNOSIS — R200 Anesthesia of skin: Secondary | ICD-10-CM

## 2018-04-12 LAB — POCT CBC W AUTO DIFF (K'VILLE URGENT CARE)

## 2018-04-12 LAB — POCT INFLUENZA A/B
Influenza A, POC: NEGATIVE
Influenza B, POC: NEGATIVE

## 2018-04-12 LAB — POCT FASTING CBG KUC MANUAL ENTRY: POCT Glucose (KUC): 76 mg/dL (ref 70–99)

## 2018-04-12 MED ORDER — AMOXICILLIN-POT CLAVULANATE 875-125 MG PO TABS: 1.0000 | ORAL_TABLET | Freq: Two times a day (BID) | ORAL | 0 refills | Status: DC

## 2018-04-12 NOTE — Discharge Instructions (Signed)
°  Be sure to stay well hydrated and get plenty of rest.  You may continue to take all your medications as prescribed until further notice.  Once labs come back, you will be notified.    Call 911 or have someone drive you to the hospital if symptoms significantly worsening, passing out, unable to walk, unable to keep down fluids, severe headache, one sided weakness.

## 2018-04-12 NOTE — ED Provider Notes (Signed)
[name]    CSN: [id number] Arrival date & time: 04/12/18  1606     History   Chief Complaint Chief Complaint  Patient presents with  . Numbness    HPI Julie Johnston is a 48 y.o. female.   HPI Julie Johnston is a 48 y.o. female presenting to UC with c/o numbness in her hands, feet, and tongue that started around 1300 today. She called her PCP's office who directed her to the ED or UC for evaluation.  Pt states " I just don't feel good."  She was tx with steroids and antibiotics this weekend for tonsillitis. She was dx home with Keflex and is still taking the keflex. She was seen by her PCP two days ago, still having a sore throat. Strep tests were negative.  Pt has been on Keflex before. Hx of anxiety but denies panic attacks and states this numbness feels different than her anxiety. Denies chest pain, dizziness, change in vision.   She is fatigued. No change in her medications.    Past Medical History:  Diagnosis Date  . Depression   . Obesity (BMI 35.0-39.9 without comorbidity)     Patient Active Problem List   Diagnosis Date Noted  . Fever and chills 12/16/2017  . Potential exposure to STD 12/16/2017  . Migraine 11/29/2017  . Dysuria 11/29/2017  . Drug-induced weight gain 02/23/2016  . Urine ketones 09/22/2015  . Fibromyalgia 09/22/2015  . Obesity 09/22/2015  . Obstructive sleep apnea 06/18/2015  . Pre-diabetes 05/14/2015  . Lyme disease 05/09/2015  . Insomnia 05/09/2015  . Palpitations 05/09/2015  . Chronic fatigue 05/09/2015  . No energy 05/09/2015  . Hyperlipidemia 05/09/2015  . Abnormal Pap smear of cervix 05/09/2015  . Attention deficit hyperactivity disorder (ADHD), predominantly inattentive type 05/09/2015  . MDD (major depressive disorder), recurrent episode, moderate ([place]) 05/09/2015    Past Surgical History:  Procedure Laterality Date  . CHOLECYSTECTOMY    . lumbar microdiskectomy      OB History    Gravida  0   Para  0   Term  0   Preterm  0   AB  0   Living  0     SAB  0   TAB  0   Ectopic  0   Multiple  0   Live Births               Home Medications    Prior to Admission medications   Medication Sig Start Date End Date Taking? Authorizing Provider  buPROPion (WELLBUTRIN XL) 300 MG 24 hr tablet Take 300 mg by mouth daily.    [provider]  cephALEXin (KEFLEX) 500 MG capsule Take by mouth. 04/09/18 04/16/18  [provider]  gabapentin (NEURONTIN) 600 MG tablet TAKE 1/2 TO 1 TABLET BY MOUTH EVERY 8 HOURS 02/18/17   [provider]  JUNEL FE 1/20 1-20 MG-MCG tablet TAKE A ACTIVE PILL CONTINUOUSLY. SHE WILL NEED TO PICK UP PACK SOONER THAN USUAL. 04/10/18   Emily Filbert, MD  lamoTRIgine (LAMICTAL) 200 MG tablet Take 200 mg by mouth daily. 12/18/16   [provider]  liothyronine (CYTOMEL) 50 MCG tablet Take 50 mcg by mouth daily. 02/14/17   [provider]  lithium carbonate (LITHOBID) 300 MG CR tablet Take 300 mg by mouth at bedtime. 03/12/17   [provider]  rizatriptan (MAXALT-MLT) 10 MG disintegrating tablet Take 1 tablet (10 mg total) by mouth as needed for migraine. May repeat in  2 hours if needed 11/29/17   Silverio Decamp, MD  traZODone (DESYREL) 50 MG tablet Take 50 mg by mouth at bedtime. 07/16/16   [provider]  trimethoprim-polymyxin b (POLYTRIM) ophthalmic solution Place 1 drop into the right eye every 4 (four) hours. For 7 days. 02/01/18   Breeback, Royetta Car, PA-C  valACYclovir (VALTREX) 1000 MG tablet Take 2 tablets (2,000 mg total) by mouth 2 (two) times daily. For one day. 02/01/18   Donella Stade, PA-C    Family History Family History  Problem Relation Age of Onset  . Hypertension Mother   . Alcohol abuse Father   . Heart attack Father   . Depression Father   . Parkinson's disease Father   . Aneurysm Maternal Grandfather   . Stroke Paternal Grandmother   . Heart attack Paternal Grandfather   .  Cancer Neg Hx     Social History Social History   Tobacco Use  . Smoking status: Former Research scientist (life sciences)  . Smokeless tobacco: Never Used  Substance Use Topics  . Alcohol use: Yes    Alcohol/week: 0.0 standard drinks  . Drug use: No     Allergies   Belviq [lorcaserin hcl]   Review of Systems Review of Systems  Constitutional: Positive for fatigue. Negative for chills and fever.  Eyes: Negative for photophobia and visual disturbance.  Respiratory: Negative for chest tightness and shortness of breath.   Cardiovascular: Negative for chest pain and palpitations.  Gastrointestinal: Negative for diarrhea, nausea and vomiting.  Neurological: Positive for weakness ( generalized) and numbness. Negative for dizziness, light-headedness and headaches.     Physical Exam Triage Vital Signs ED Triage Vitals  Enc Vitals Group     BP 04/12/18 1625 (!) 147/85     Pulse Rate 04/12/18 1625 93     Resp 04/12/18 1625 18     Temp 04/12/18 1625 97.8 F (36.6 C)     Temp Source 04/12/18 1625 Oral     SpO2 04/12/18 1625 97 %     Weight --      Height --      Head Circumference --      Peak Flow --      Pain Score 04/12/18 1626 0     Pain Loc --      Pain Edu? --      Excl. in [place]? --    No data found.  Updated Vital Signs BP (!) 147/85 (BP Location: Right Arm)   Pulse 93   Temp 97.8 F (36.6 C) (Oral)   Resp 18   SpO2 97%   Visual Acuity Right Eye Distance:   Left Eye Distance:   Bilateral Distance:    Right Eye Near:   Left Eye Near:    Bilateral Near:     Physical Exam Vitals signs and nursing note reviewed.  Constitutional:      Appearance: Normal appearance. She is well-developed.  HENT:     Head: Normocephalic and atraumatic.     Right Ear: Tympanic membrane normal.     Left Ear: Tympanic membrane normal.     Nose: Nose normal.     Right Sinus: No maxillary sinus tenderness or frontal sinus tenderness.     Left Sinus: No maxillary sinus tenderness or frontal sinus  tenderness.     Mouth/Throat:     Lips: Pink.     Mouth: Mucous membranes are moist.     Pharynx: Oropharynx is clear. Uvula midline. Posterior oropharyngeal erythema present.  Eyes:     Extraocular Movements: Extraocular movements intact.     Conjunctiva/sclera: Conjunctivae normal.     Pupils: Pupils are equal, round, and reactive to light.  Neck:     Musculoskeletal: Normal range of motion and neck supple. No muscular tenderness.  Cardiovascular:     Rate and Rhythm: Normal rate and regular rhythm.  Pulmonary:     Effort: Pulmonary effort is normal.     Breath sounds: Normal breath sounds.  Abdominal:     General: There is no distension.     Palpations: Abdomen is soft.     Tenderness: There is no abdominal tenderness.  Musculoskeletal: Normal range of motion.  Skin:    General: Skin is warm and dry.     Capillary Refill: Capillary refill takes less than 2 seconds.  Neurological:     General: No focal deficit present.     Mental Status: She is alert and oriented to person, place, and time.     Cranial Nerves: No cranial nerve deficit.  Psychiatric:        Behavior: Behavior normal.      UC Treatments / Results  Labs (all labs ordered are listed, but only abnormal results are displayed) Labs Reviewed  T4, FREE - Abnormal; Notable for the following components:      Result Value   Free T4 0.3 (*)    All other components within normal limits  BASIC METABOLIC PANEL  T3, FREE  VITAMIN B12  TSH  TEST AUTHORIZATION  POCT FASTING CBG [facility] MANUAL ENTRY  POCT INFLUENZA A/B  POCT CBC W AUTO DIFF ([facility])    EKG None  Radiology No results found.  Procedures Procedures (including critical care time)  Medications Ordered in UC Medications - No data to display  Initial Impression / Assessment and Plan / UC Course  I have reviewed the triage vital signs and the nursing notes.  Pertinent labs & imaging results that were available during my care of the  patient were reviewed by me and considered in my medical decision making (see chart for details).     Consulted with Dr. Madilyn Fireman, PCP . Labs ordered. Pt safe for discharge home at this point. Encouraged fluids and rest. Discussed symptoms that warrant emergent care in the ED.  Final Clinical Impressions(s) / UC Diagnoses   Final diagnoses:  Numbness  Generalized weakness     Discharge Instructions      Be sure to stay well hydrated and get plenty of rest.  You may continue to take all your medications as prescribed until further notice.  Once labs come back, you will be notified.    Call 911 or have someone drive you to the hospital if symptoms significantly worsening, passing out, unable to walk, unable to keep down fluids, severe headache, one sided weakness.     ED Prescriptions    None     Controlled Substance Prescriptions [place] Controlled Substance Registry consulted? Not Applicable   Tyrell Antonio 04/15/18 2992

## 2018-04-12 NOTE — ED Triage Notes (Signed)
Pt c/o hands, feet and tongue numbness x 1300 today. She reports "I just don't feel good". She was in the hospital over the weekend with tonsillitis and given Rocephin and IV steroids. She is taking Keflex. She has taken Keflex before without difficulty.

## 2018-04-13 NOTE — ED Notes (Signed)
TSH ordered via phone by Laurence Spates, RN per Dr Assunta Found.

## 2018-04-14 ENCOUNTER — Telehealth: Payer: Self-pay

## 2018-04-14 LAB — BASIC METABOLIC PANEL
BUN: 9 mg/dL (ref 7–25)
CO2: 26 mmol/L (ref 20–32)
Calcium: 9.5 mg/dL (ref 8.6–10.2)
Chloride: 104 mmol/L (ref 98–110)
Creat: 0.86 mg/dL (ref 0.50–1.10)
Glucose, Bld: 80 mg/dL (ref 65–99)
Potassium: 4.6 mmol/L (ref 3.5–5.3)
Sodium: 140 mmol/L (ref 135–146)

## 2018-04-14 LAB — T3, FREE: T3, Free: 3.5 pg/mL (ref 2.3–4.2)

## 2018-04-14 LAB — T4, FREE: Free T4: 0.3 ng/dL — ABNORMAL LOW (ref 0.8–1.8)

## 2018-04-14 LAB — TSH: TSH: 0.65 mIU/L

## 2018-04-14 LAB — TEST AUTHORIZATION

## 2018-04-14 LAB — VITAMIN B12: Vitamin B-12: 470 pg/mL (ref 200–1100)

## 2018-04-14 NOTE — Telephone Encounter (Signed)
Left message on pt VM with lab results and recommendation from Dr Assunta Found to follow up with PCP for further evaluation.

## 2018-05-01 ENCOUNTER — Telehealth: Payer: Self-pay

## 2018-05-01 NOTE — Telephone Encounter (Signed)
Julie Johnston called and states she is having chest pain and arm pain. She is seeing a NP and was advised to go to the ED after her EKG showed abnormal T waves. She wanted recommendations on if she should go to the ED. I advised she should go to the ED.

## 2018-05-01 NOTE — Telephone Encounter (Signed)
Yes

## 2018-05-02 ENCOUNTER — Encounter: Payer: Self-pay | Admitting: Physician Assistant

## 2018-05-02 ENCOUNTER — Ambulatory Visit: Payer: BLUE CROSS/BLUE SHIELD | Admitting: Physician Assistant

## 2018-05-02 VITALS — BP 128/70 | HR 88 | Ht 65.0 in | Wt 245.0 lb

## 2018-05-02 DIAGNOSIS — D72828 Other elevated white blood cell count: Secondary | ICD-10-CM | POA: Diagnosis not present

## 2018-05-02 DIAGNOSIS — J329 Chronic sinusitis, unspecified: Secondary | ICD-10-CM

## 2018-05-02 DIAGNOSIS — Z8619 Personal history of other infectious and parasitic diseases: Secondary | ICD-10-CM

## 2018-05-02 DIAGNOSIS — R350 Frequency of micturition: Secondary | ICD-10-CM

## 2018-05-02 DIAGNOSIS — J40 Bronchitis, not specified as acute or chronic: Secondary | ICD-10-CM

## 2018-05-02 DIAGNOSIS — R05 Cough: Secondary | ICD-10-CM

## 2018-05-02 DIAGNOSIS — R9389 Abnormal findings on diagnostic imaging of other specified body structures: Secondary | ICD-10-CM | POA: Diagnosis not present

## 2018-05-02 DIAGNOSIS — R7989 Other specified abnormal findings of blood chemistry: Secondary | ICD-10-CM | POA: Diagnosis not present

## 2018-05-02 DIAGNOSIS — R059 Cough, unspecified: Secondary | ICD-10-CM

## 2018-05-02 MED ORDER — FLUTICASONE PROPIONATE 50 MCG/ACT NA SUSP: 2.0000 | Freq: Every day | NASAL | 6 refills | Status: DC

## 2018-05-02 MED ORDER — LEVOFLOXACIN 750 MG PO TABS: 750.0000 mg | ORAL_TABLET | Freq: Every day | ORAL | 5 refills | Status: DC

## 2018-05-02 NOTE — Patient Instructions (Signed)
Atelectasis, Adult  Atelectasis is a collapse of air sacs in the lungs (alveoli). The condition causes all or part of a lung to collapse. Atelectasis is a common problem after surgery. Its severity depends on the size of lung tissue area involved and the underlying cause. When severe, it can lead to shortness of breath and heart problems. Atelectasis can develop suddenly or over a long period of time. Atelectasis that develops over a long period of time (chronic atelectasis) often leads to infection, scarring, and other problems. What are the causes? This condition may be caused by:  Shallow breathing.  Medicines that make breathing more shallow.  A blockage in an airway. Blockages can result from: ? A buildup of mucus. ? A tumor. ? An inhaled object (foreign body). ? Enlarged lymph nodes. ? Fluid in the lungs (pleural effusion). ? A blood clot in the lungs.  Outside pressure on the lung. Pressure can be due to: ? A tumor. ? Fluid in the lungs (pleural effusion). ? Air leaking between the lung and rib cage (pneumothorax). ? Enlarged lymph nodes.  Improper expansion of the lungs. This may occur in newborns because of: ? Prematurity. ? Low oxygen levels. ? Secretions at birth that block the airway. ? Amniotic fluid that goes into the lungs (aspiration). What increases the risk? This condition is more likely to develop in people who:  Have an injury or health problem that makes taking deep breaths difficult or painful.  Have certain infections or diseases, such as pneumonia or cystic fibrosis.  Have had surgery on the chest or abdomen.  Have broken ribs.  Have a tight bandage around their chest.  Have a collapsed lung due to pneumothorax.  Take medicines that decrease the rate of their breathing or how deeply they breathe, like sedatives.  Lie flat for long periods of time. What are the signs or symptoms? Often, there are no symptoms for this condition. When symptoms do  appear, they may include:  Shortness of breath.  Bluish color to the nails, lips, or mouth (cyanosis).  A cough. How is this diagnosed? This condition may be diagnosed based on:  Symptoms.  A physical exam.  A chest X-ray. Sometimes specialized imaging tests are needed to diagnose the condition. How is this treated? Treatment for this condition depends on what caused the condition. Treatment may involve:  Coughing. Coughing helps loosen mucus in the airway.  Chest physiotherapy. This is a treatment to help loosen and clear mucus from the airways. It is done by clapping the chest.  Postural drainage techniques. This treatment involves positioning your body so your head is lower than your chest. It helps mucus drain from your airways.  An incentive spirometer. This is a device that is used to help with taking deeper breaths.  Positive pressure breathing. This is a form of breathing assistance in which air is forced into the lungs when you breathe in (inhale). You may have this treatment if your condition is severe.  Treatment of the underlying condition. Follow these instructions at home:  Take over-the-counter and prescription medicines only as told by your health care provider.  Practice taking relaxed and deep breaths when you are sitting. A good time to practice is when you are watching TV. Take a few deep breaths during each commercial break.  Make sure to lie on your unaffected side when you are lying down. For example, if you have atelectasis in your left lung, lie on your right side. This will help  mucus drain from your airway.  Cough several times a day as told by your health care provider.  Perform chest physiotherapy or postural drainage techniques as told by your health care provider. If necessary, have someone help you.  If you were given a device to help with breathing, use it as told by your health care provider.  Stay as active as possible. Get help right  away if:  Your breathing problems get worse.  You have severe chest pain.  You develop severe coughing.  You cough up blood.  You have a fever.  You have persistent symptoms for more than 2-3 days.  Your symptoms suddenly get worse. This information is not intended to replace advice given to you by your health care provider. Make sure you discuss any questions you have with your health care provider. Document Released: 03/08/2005 Document Revised: 09/26/2015 Document Reviewed: 08/11/2015 Elsevier Interactive Patient Education  2019 [employer].

## 2018-05-02 NOTE — Progress Notes (Signed)
I  Subjective:    Patient ID: Julie Johnston, female    DOB: Feb 05, 1971, 48 y.o.   MRN: [id number]  HPI  Pt is a 48 yo female who presents to the clinic to follow up on recurrent infections that having been going on since September of 2019 with viral infection, October the flu, November bronchitis, christmas the flu again, January tonsilitis/UTI and now CP. Last abx was keflex in January.  She has had a string of viral and bacterial infections that she has had clinic and ER visits for. The most recent dx sent her to the ER with CP. Full cardiac work up was done and negative for any cardiac etiology. She will have a stress echo. She did have an abnormal CXR showing atelectasis or inflitrate with right greater than left. She comes in to have evaluation. She does not feel well. She is very fatigued. She continues to cough productively. She also has some urinary frequeny. Hx of UTI. She had an elevated WBC at ED as well.  .. Active Ambulatory Problems    Diagnosis Date Noted  . Lyme disease 05/09/2015  . Insomnia 05/09/2015  . Palpitations 05/09/2015  . Chronic fatigue 05/09/2015  . No energy 05/09/2015  . Hyperlipidemia 05/09/2015  . Abnormal Pap smear of cervix 05/09/2015  . Attention deficit hyperactivity disorder (ADHD), predominantly inattentive type 05/09/2015  . MDD (major depressive disorder), recurrent episode, moderate ([place]) 05/09/2015  . Pre-diabetes 05/14/2015  . Obstructive sleep apnea 06/18/2015  . Urine ketones 09/22/2015  . Fibromyalgia 09/22/2015  . Obesity 09/22/2015  . Drug-induced weight gain 02/23/2016  . Migraine 11/29/2017  . Dysuria 11/29/2017  . Fever and chills 12/16/2017  . Potential exposure to STD 12/16/2017  . Frequent infections 05/08/2018  . Decreased thyroid stimulating hormone (TSH) level 05/08/2018   Resolved Ambulatory Problems    Diagnosis Date Noted  . No Resolved Ambulatory Problems   Past Medical History:  Diagnosis Date  . Depression   .  Obesity (BMI 35.0-39.9 without comorbidity)           Review of Systems    see HPI.  Objective:   Physical Exam Vitals signs reviewed.  Constitutional:      Appearance: Normal appearance.  HENT:     Head: Normocephalic and atraumatic.     Right Ear: Tympanic membrane normal.     Left Ear: Tympanic membrane normal.     Nose: Nose normal.     Mouth/Throat:     Mouth: Mucous membranes are moist.  Eyes:     Pupils: Pupils are equal, round, and reactive to light.  Cardiovascular:     Rate and Rhythm: Normal rate.     Pulses: Normal pulses.  Pulmonary:     Effort: Pulmonary effort is normal.     Breath sounds: Normal breath sounds.  Neurological:     General: No focal deficit present.     Mental Status: She is alert and oriented to person, place, and time.  Psychiatric:        Mood and Affect: Mood normal.        Behavior: Behavior normal.           Assessment & Plan:  [name] KitchenMarland KitchenDelainee was seen today for hospitalization follow-up.  Diagnoses and all orders for this visit:  Abnormal CXR -     CBC with Differential/Platelet -     TSH -     levofloxacin (LEVAQUIN) 750 MG tablet; Take 1 tablet (750 mg total) by mouth daily. -  DG Chest 2 View  Other elevated white blood cell (WBC) count -     CBC with Differential/Platelet -     IgG, IgA, IgM  Decreased thyroid stimulating hormone (TSH) level -     TSH  Urinary frequency -     POCT Urinalysis Dipstick -     Urine Culture  Frequent infections -     levofloxacin (LEVAQUIN) 750 MG tablet; Take 1 tablet (750 mg total) by mouth daily. -     IgG, IgA, IgM -     DG Chest 2 View  Sinobronchitis -     fluticasone (FLONASE) 50 MCG/ACT nasal spray; Place 2 sprays into both nostrils daily. -     levofloxacin (LEVAQUIN) 750 MG tablet; Take 1 tablet (750 mg total) by mouth daily. -     DG Chest 2 View  Cough -     DG Chest 2 View  .[name] Kitchen Results for orders placed or performed in visit on 05/02/18  Urine Culture   Result Value Ref Range   MICRO NUMBER: [id number]    SPECIMEN QUALITY: Adequate    Sample Source URINE    STATUS: FINAL    Result: No Growth   POCT Urinalysis Dipstick  Result Value Ref Range   Color, UA yellow    Clarity, UA clear    Glucose, UA Negative Negative   Bilirubin, UA negative    Ketones, UA negative    Spec Grav, UA 1.020 1.010 - 1.025   Blood, UA negative    pH, UA 6.5 5.0 - 8.0   Protein, UA Negative Negative   Urobilinogen, UA 0.2 0.2 or 1.0 E.U./dL   Nitrite, UA negative    Leukocytes, UA Negative Negative   Appearance     Odor      Concerned with recurrent infections. Pt does have a CXR that shows atelectasis vs infiltrate. With pt coughing, fatigued will treat empirically for pneumonia/bronchitis/sinusitis with levaquin/flonase.   Labs ordered to have drawn after complete abx for IgG/M,A to look at reasons for frequent infections.   CXR to have done after complete therapy.   No growth on urine culture.

## 2018-05-03 LAB — URINE CULTURE
MICRO NUMBER:: 180302
Result:: NO GROWTH
SPECIMEN QUALITY: ADEQUATE

## 2018-05-04 ENCOUNTER — Encounter: Payer: Self-pay | Admitting: Physician Assistant

## 2018-05-04 NOTE — Progress Notes (Signed)
Call pt: urine culture is negative. No bacteria grew.

## 2018-05-05 MED ORDER — AZITHROMYCIN 250 MG PO TABS: ORAL_TABLET | ORAL | 0 refills | Status: DC

## 2018-05-05 MED ORDER — FLUCONAZOLE 150 MG PO TABS: 150.0000 mg | ORAL_TABLET | Freq: Once | ORAL | 0 refills | Status: AC

## 2018-05-08 ENCOUNTER — Encounter: Payer: Self-pay | Admitting: Physician Assistant

## 2018-05-08 DIAGNOSIS — R7989 Other specified abnormal findings of blood chemistry: Secondary | ICD-10-CM | POA: Insufficient documentation

## 2018-05-08 DIAGNOSIS — Z8619 Personal history of other infectious and parasitic diseases: Secondary | ICD-10-CM | POA: Insufficient documentation

## 2018-05-08 HISTORY — DX: Other specified abnormal findings of blood chemistry: R79.89

## 2018-05-08 LAB — POCT URINALYSIS DIPSTICK
Bilirubin, UA: NEGATIVE
Blood, UA: NEGATIVE
Glucose, UA: NEGATIVE
KETONES UA: NEGATIVE
Leukocytes, UA: NEGATIVE
Nitrite, UA: NEGATIVE
Protein, UA: NEGATIVE
Spec Grav, UA: 1.02 (ref 1.010–1.025)
Urobilinogen, UA: 0.2 E.U./dL
pH, UA: 6.5 (ref 5.0–8.0)

## 2018-05-09 LAB — IGG, IGA, IGM
IgG (Immunoglobin G), Serum: 1112 mg/dL (ref 600–1640)
IgM, Serum: 87 mg/dL (ref 50–300)
Immunoglobulin A: 110 mg/dL (ref 47–310)

## 2018-05-09 NOTE — Progress Notes (Signed)
All your immunoglobin are good. Great news but does not explain why you get so many frequent infections.

## 2018-05-17 ENCOUNTER — Telehealth: Payer: Self-pay | Admitting: Physician Assistant

## 2018-05-17 DIAGNOSIS — R931 Abnormal findings on diagnostic imaging of heart and coronary circulation: Secondary | ICD-10-CM | POA: Insufficient documentation

## 2018-05-17 NOTE — Telephone Encounter (Signed)
Echo results

## 2018-05-24 ENCOUNTER — Ambulatory Visit (INDEPENDENT_AMBULATORY_CARE_PROVIDER_SITE_OTHER): Payer: Managed Care, Other (non HMO)

## 2018-05-24 DIAGNOSIS — R05 Cough: Secondary | ICD-10-CM | POA: Diagnosis not present

## 2018-05-25 ENCOUNTER — Encounter: Payer: Self-pay | Admitting: Physician Assistant

## 2018-05-26 NOTE — Progress Notes (Signed)
Call pt: how are you feeling today. There continues to be linear density despite antibiotic treatment. I think this represents atelectasis. Work on taking good deep/full breaths multiple times a day.

## 2018-07-12 ENCOUNTER — Ambulatory Visit (INDEPENDENT_AMBULATORY_CARE_PROVIDER_SITE_OTHER): Payer: Managed Care, Other (non HMO) | Admitting: Family Medicine

## 2018-07-12 ENCOUNTER — Encounter: Payer: Self-pay | Admitting: Family Medicine

## 2018-07-12 VITALS — BP 145/77 | HR 94 | Temp 98.1°F | Wt 250.0 lb

## 2018-07-12 DIAGNOSIS — S161XXA Strain of muscle, fascia and tendon at neck level, initial encounter: Secondary | ICD-10-CM

## 2018-07-12 MED ORDER — CYCLOBENZAPRINE HCL 5 MG PO TABS: 5.0000 mg | ORAL_TABLET | Freq: Three times a day (TID) | ORAL | 0 refills | Status: DC | PRN

## 2018-07-12 NOTE — Patient Instructions (Addendum)
Thank you for coming in today. Continue tylenol or iburpfeon for pain.  Use muscle relaxer as needed.  Use tens unit and heating pad.   This should slowly improve over 1-3 weeks.  If not getting better next step would be referral to physical therapy.    Other medicines could be gabapentin (increased dose) and prednisone.    TENS UNIT: This is helpful for muscle pain and spasm.   Search and Purchase a TENS 7000 2nd edition at  www.tenspros.com or www.[place].com It should be less than $30.     TENS unit instructions: Do not shower or bathe with the unit on Turn the unit off before removing electrodes or batteries If the electrodes lose stickiness add a drop of water to the electrodes after they are disconnected from the unit and place on plastic sheet. If you continued to have difficulty, call the TENS unit company to purchase more electrodes. Do not apply lotion on the skin area prior to use. Make sure the skin is clean and dry as this will help prolong the life of the electrodes. After use, always check skin for unusual red areas, rash or other skin difficulties. If there are any skin problems, does not apply electrodes to the same area. Never remove the electrodes from the unit by pulling the wires. Do not use the TENS unit or electrodes other than as directed. Do not change electrode placement without consultating your therapist or physician. Keep 2 fingers with between each electrode. Wear time ratio is 2:1, on to off times.    For example on for 30 minutes off for 15 minutes and then on for 30 minutes off for 15 minutes

## 2018-07-12 NOTE — Progress Notes (Signed)
Julie Johnston is a 48 y.o. female who presents to [facility] today for right neck and upper back pain.  Patient developed pain in her right neck and upper back starting yesterday.  She been doing some gardening and increasing her activity recently.  She was loading a bag of dirt into her car yesterday and developed some pain in her right cervical spine and periscapular area of her right thoracic back.  She notes the pain worsened yesterday and into today.  She notes moderate to severe pain worse with arm motion and neck motion.  She denies any radiating pain weakness or numbness distally.  She feels well otherwise with no fevers or chills.  She is tried some ibuprofen and Tylenol which helps a little.  She has not tried a heating pad or a TENS unit yet.   ROS:  As above  Exam:  BP (!) 145/77   Pulse 94   Temp 98.1 F (36.7 C) (Oral)   Wt 250 lb (113.4 kg)   BMI 41.60 kg/m  Wt Readings from Last 5 Encounters:  07/12/18 250 lb (113.4 kg)  05/02/18 245 lb (111.1 kg)  04/10/18 239 lb (108.4 kg)  12/21/17 246 lb (111.6 kg)  11/29/17 242 lb (109.8 kg)   General: Well Developed, well nourished, and in no acute distress.  Neuro/Psych: Alert and oriented x3, extra-ocular muscles intact, able to move all 4 extremities, sensation grossly intact. Skin: Warm and dry, no rashes noted.  Respiratory: Not using accessory muscles, speaking in full sentences, trachea midline.  Cardiovascular: Pulses palpable, no extremity edema. Abdomen: Does not appear distended. MSK: C-spine: Nontender to spinal midline.  Tender palpation right cervical paraspinal musculature. Cervical motion intact to extension and flexion.  Pain and reduced range of motion to right rotation right flexion normal left. Upper extremity strength is equal normal throughout. Pulses cap refill and sensation are intact distally.  Right shoulder normal-appearing not particularly tender  normal shoulder motion.  Negative impingement testing normal strength.       Assessment and Plan: 48 y.o. female with right neck and upper back pain.  Very likely myofascial strain dysfunction and spasm.  Plan to treat with home exercise program relative rest NSAIDs Tylenol heating pad TENS unit and cyclobenzaprine.  If not improving neck step would be referral to physical therapy.  Recheck as needed.   PDMP not reviewed this encounter. No orders of the defined types were placed in this encounter.  Meds ordered this encounter  Medications  . cyclobenzaprine (FLEXERIL) 5 MG tablet    Sig: Take 1-2 tablets (5-10 mg total) by mouth 3 (three) times daily as needed for muscle spasms.    Dispense:  60 tablet    Refill:  0    Historical information moved to improve visibility of documentation.  Past Medical History:  Diagnosis Date  . Depression   . Obesity (BMI 35.0-39.9 without comorbidity)    Past Surgical History:  Procedure Laterality Date  . CHOLECYSTECTOMY    . lumbar microdiskectomy     Social History   Tobacco Use  . Smoking status: Former Research scientist (life sciences)  . Smokeless tobacco: Never Used  Substance Use Topics  . Alcohol use: Yes    Alcohol/week: 0.0 standard drinks   family history includes Alcohol abuse in her father; Aneurysm in her maternal grandfather; Depression in her father; Heart attack in her father and paternal grandfather; Hypertension in her mother; Parkinson's disease in her father; Stroke in her paternal  grandmother.  Medications: Current Outpatient Medications  Medication Sig Dispense Refill  . buPROPion (WELLBUTRIN XL) 300 MG 24 hr tablet Take 300 mg by mouth daily.    . fluticasone (FLONASE) 50 MCG/ACT nasal spray Place 2 sprays into both nostrils daily. 16 g 6  . gabapentin (NEURONTIN) 600 MG tablet TAKE 1/2 TO 1 TABLET BY MOUTH EVERY 8 HOURS  3  . JUNEL FE 1/20 1-20 MG-MCG tablet TAKE A ACTIVE PILL CONTINUOUSLY. SHE WILL NEED TO PICK UP PACK SOONER THAN  USUAL. 84 tablet 4  . lamoTRIgine (LAMICTAL) 200 MG tablet Take 200 mg by mouth daily.  3  . liothyronine (CYTOMEL) 50 MCG tablet Take 50 mcg by mouth daily.  3  . rizatriptan (MAXALT-MLT) 10 MG disintegrating tablet Take 1 tablet (10 mg total) by mouth as needed for migraine. May repeat in 2 hours if needed 10 tablet 3  . traZODone (DESYREL) 50 MG tablet Take 50 mg by mouth at bedtime.    . valACYclovir (VALTREX) 1000 MG tablet Take 2 tablets (2,000 mg total) by mouth 2 (two) times daily. For one day. 14 tablet 2  . cyclobenzaprine (FLEXERIL) 5 MG tablet Take 1-2 tablets (5-10 mg total) by mouth 3 (three) times daily as needed for muscle spasms. 60 tablet 0   No current facility-administered medications for this visit.    Allergies  Allergen Reactions  . Belviq [Lorcaserin Hcl]     Made eyes crossed.       Discussed warning signs or symptoms. Please see discharge instructions. Patient expresses understanding.

## 2018-07-20 ENCOUNTER — Ambulatory Visit (INDEPENDENT_AMBULATORY_CARE_PROVIDER_SITE_OTHER): Payer: Managed Care, Other (non HMO) | Admitting: Sports Medicine

## 2018-07-20 ENCOUNTER — Encounter: Payer: Self-pay | Admitting: Sports Medicine

## 2018-07-20 DIAGNOSIS — R69 Illness, unspecified: Secondary | ICD-10-CM

## 2018-07-20 DIAGNOSIS — J111 Influenza due to unidentified influenza virus with other respiratory manifestations: Secondary | ICD-10-CM | POA: Insufficient documentation

## 2018-07-20 DIAGNOSIS — F331 Major depressive disorder, recurrent, moderate: Secondary | ICD-10-CM | POA: Diagnosis not present

## 2018-07-20 MED ORDER — IBUPROFEN 800 MG PO TABS: 800.0000 mg | ORAL_TABLET | Freq: Three times a day (TID) | ORAL | 2 refills | Status: DC | PRN

## 2018-07-20 MED ORDER — OSELTAMIVIR PHOSPHATE 75 MG PO CAPS: 75.0000 mg | ORAL_CAPSULE | Freq: Two times a day (BID) | ORAL | 0 refills | Status: DC

## 2018-07-20 NOTE — Assessment & Plan Note (Signed)
Fevers up to 103 Fahrenheit, no shortness of breath, mild cough, myalgias, fatigue. No GI symptoms. Patient obviously concerned about coronavirus. Advised that most viral illnesses will present similarly. Symptoms present for less than 1 day now. She will use her pulse oximeter and get me her pulse rate oximetry readings daily. Adding ibuprofen 800 mg 3 times daily, because of new onset of symptoms and also the possibility of influenza we are going to add Tamiflu. She has been with her boyfriend through the night, they will relatively quarantined himself, because he is likely already been exposed to what ever she has they will both wear masks and avoid direct contact but I do not think they need to isolate themselves from each other, her boyfriend will isolate himself however for 14 days from his roommates at his apartment. I am going to write a letter to keep her out of work for a couple of days, she works from home but feels as though she would not be able to concentrate on her tasks today.  She also agrees to hydrate with salt-containing beverages.

## 2018-07-20 NOTE — Assessment & Plan Note (Addendum)
Historically well controlled with current medications, she has had several life stressors, including poor job with and the threat of termination, as well as having to put her dog down. She was very tearful on the phone today. For now she will increase her gabapentin to 3 times daily, and possibly 4 times daily to control the acute anxiety symptoms, and follow this up with her PCP.

## 2018-07-20 NOTE — Progress Notes (Deleted)
Woke up this morning (around 5:30 am) with sore throat, fever, and body aches.  No coughing/shortness of breath. No symptoms prior to this morning. She is concerned about Covid. Her boyfriend is there and doesn't know whether it is safe for him to leave.

## 2018-07-20 NOTE — Progress Notes (Signed)
Virtual Visit via Telephone   I connected with  Julie Johnston  on 07/20/18 by telephone/telehealth and verified that I am speaking with the correct person using two identifiers.   I discussed the limitations, risks, security and privacy concerns of performing an evaluation and management service by telephone, including the higher likelihood of inaccurate diagnosis and treatment, and the availability of in person appointments.  We also discussed the likely need of an additional face to face encounter for complete and high quality delivery of care.  I also discussed with the patient that there may be a patient responsible charge related to this service. The patient expressed understanding and wishes to proceed.  Provider location is either at home or medical facility. Patient location is at their home, different from provider location. People involved in care of the patient during this telehealth encounter were myself, my nurse/medical assistant, and my front office/scheduling team member.  Subjective:    CC: Fever  HPI: This is a 48 year old female with a history of anxiety, she is had a very stressful week, has been having some poor job performance and threats of termination, subsequently she had to put her dog down.  Then through the night she developed fevers up to 102, now 103 F, muscle aches, body aches, fatigue but no shortness of breath, chest pain, only mild cough, no GI symptoms, no skin rash.  Her boyfriend has been with her through the night and is not experiencing any symptoms yet.  No suicidal or homicidal ideation.  I reviewed the past medical history, family history, social history, surgical history, and allergies today and no changes were needed.  Please see the problem list section below in epic for further details.  Past Medical History: Past Medical History:  Diagnosis Date  . Depression   . Obesity (BMI 35.0-39.9 without comorbidity)    Past Surgical History: Past  Surgical History:  Procedure Laterality Date  . CHOLECYSTECTOMY    . lumbar microdiskectomy     Social History: Social History   Socioeconomic History  . Marital status: Divorced    Spouse name: Not on file  . Number of children: Not on file  . Years of education: Not on file  . Highest education level: Not on file  Occupational History  . Not on file  Social Needs  . Financial resource strain: Not on file  . Food insecurity:    Worry: Not on file    Inability: Not on file  . Transportation needs:    Medical: Not on file    Non-medical: Not on file  Tobacco Use  . Smoking status: Former Research scientist (life sciences)  . Smokeless tobacco: Never Used  Substance and Sexual Activity  . Alcohol use: Yes    Alcohol/week: 0.0 standard drinks  . Drug use: No  . Sexual activity: Yes  Lifestyle  . Physical activity:    Days per week: Not on file    Minutes per session: Not on file  . Stress: Not on file  Relationships  . Social connections:    Talks on phone: Not on file    Gets together: Not on file    Attends religious service: Not on file    Active member of club or organization: Not on file    Attends meetings of clubs or organizations: Not on file    Relationship status: Not on file  Other Topics Concern  . Not on file  Social History Narrative  . Not on file   Family  History: Family History  Problem Relation Age of Onset  . Hypertension Mother   . Alcohol abuse Father   . Heart attack Father   . Depression Father   . Parkinson's disease Father   . Aneurysm Maternal Grandfather   . Stroke Paternal Grandmother   . Heart attack Paternal Grandfather   . Cancer Neg Hx    Allergies: Allergies  Allergen Reactions  . Belviq [Lorcaserin Hcl]     Made eyes crossed.    Medications: See med rec.  Review of Systems: No fevers, chills, night sweats, weight loss, chest pain, or shortness of breath.   Objective:    General: Speaking full sentences, no audible heavy breathing.  Sounds  alert and appropriately interactive.  No other physical exam performed due to the non-face to face nature of this visit.  Impression and Recommendations:    Influenza-like illness Fevers up to 103 Fahrenheit, no shortness of breath, mild cough, myalgias, fatigue. No GI symptoms. Patient obviously concerned about coronavirus. Advised that most viral illnesses will present similarly. Symptoms present for less than 1 day now. She will use her pulse oximeter and get me her pulse rate oximetry readings daily. Adding ibuprofen 800 mg 3 times daily, because of new onset of symptoms and also the possibility of influenza we are going to add Tamiflu. She has been with her boyfriend through the night, they will relatively quarantined himself, because he is likely already been exposed to what ever she has they will both wear masks and avoid direct contact but I do not think they need to isolate themselves from each other, her boyfriend will isolate himself however for 14 days from his roommates at his apartment. I am going to write a letter to keep her out of work for a couple of days, she works from home but feels as though she would not be able to concentrate on her tasks today.  She also agrees to hydrate with salt-containing beverages.  MDD (major depressive disorder), recurrent episode, moderate ([place]) Historically well controlled with current medications, she has had several life stressors, including poor job with and the threat of termination, as well as having to put her dog down. She was very tearful on the phone today. For now she will increase her gabapentin to 3 times daily, and possibly 4 times daily to control the acute anxiety symptoms, and follow this up with her PCP.   I discussed the above assessment and treatment plan with the patient. The patient was provided an opportunity to ask questions and all were answered. The patient agreed with the plan and demonstrated an understanding of the  instructions.   The patient was advised to call back or seek an in-person evaluation if the symptoms worsen or if the condition fails to improve as anticipated.   I provided 25 minutes of non-face-to-face time during this encounter, 15 minutes of additional time was needed to gather information, review chart, records, communicate/coordinate with staff remotely, and complete documentation.   ___________________________________________ Gwen Her. Dianah Field, M.D., ABFM., CAQSM. Primary Care and Sports Medicine [facility]  Adjunct Professor of [place] [employer]

## 2018-07-21 ENCOUNTER — Telehealth: Payer: Managed Care, Other (non HMO) | Admitting: Family

## 2018-07-21 ENCOUNTER — Ambulatory Visit (INDEPENDENT_AMBULATORY_CARE_PROVIDER_SITE_OTHER): Payer: Managed Care, Other (non HMO) | Admitting: Sports Medicine

## 2018-07-21 DIAGNOSIS — R1310 Dysphagia, unspecified: Secondary | ICD-10-CM | POA: Diagnosis not present

## 2018-07-21 DIAGNOSIS — R509 Fever, unspecified: Secondary | ICD-10-CM | POA: Diagnosis not present

## 2018-07-21 DIAGNOSIS — J101 Influenza due to other identified influenza virus with other respiratory manifestations: Secondary | ICD-10-CM | POA: Diagnosis not present

## 2018-07-21 LAB — POCT INFLUENZA A/B
Influenza A, POC: NEGATIVE
Influenza B, POC: POSITIVE — AB

## 2018-07-21 MED ORDER — DICLOFENAC SODIUM 75 MG PO TBEC: 75.0000 mg | DELAYED_RELEASE_TABLET | Freq: Two times a day (BID) | ORAL | 3 refills | Status: DC

## 2018-07-21 MED ORDER — ACETAMINOPHEN ER 650 MG PO TBCR: 650.0000 mg | EXTENDED_RELEASE_TABLET | Freq: Three times a day (TID) | ORAL | 3 refills | Status: DC | PRN

## 2018-07-21 MED ORDER — CEPHALEXIN 500 MG PO CAPS: 500.0000 mg | ORAL_CAPSULE | Freq: Two times a day (BID) | ORAL | 0 refills | Status: DC

## 2018-07-21 NOTE — Telephone Encounter (Signed)
Dr Madilyn Fireman has been using the blue top with the nasopharyngeal swab.

## 2018-07-21 NOTE — Progress Notes (Signed)
Based on what you shared with me, I feel your condition warrants further evaluation and I recommend that you be seen for a face to face office visit.     NOTE: If you entered your credit card information for this eVisit, you will not be charged. You may see a "hold" on your card for the $35 but that hold will drop off and you will not have a charge processed.  If you are having a true medical emergency please call 911.  If you need an urgent face to face visit, [facility] has four urgent care centers for your convenience.    PLEASE NOTE: THE INSTACARE LOCATIONS AND URGENT CARE CLINICS DO NOT HAVE THE TESTING FOR CORONAVIRUS COVID19 AVAILABLE.  IF YOU FEEL YOU NEED THIS TEST YOU MUST GO TO A TRIAGE LOCATION AT [facility] [facility]   [email or web address] to reserve your spot online an avoid wait times  [facility] [address] [place], [place] [address] Modified hours of operation: Monday-Friday, 12 PM to 6 PM  Saturday & Sunday 10 AM to 4 PM *Across the street from [place] (New Address!) [address] [place], [place] [address] *Just off [address], across the road from [place] hours of operation: Monday-Friday, 12 PM to 6 PM  Closed Saturday & Sunday  [facility]'s modified hours of operation will be in effect from May 1 until May 31   The following sites will take your insurance:  . [facility] Urgent [place] a Provider at this Location  [address] [place], [place] [address] . 10 am to 8 pm Monday-Friday . 12 pm to 8 pm Saturday-Sunday   . [facility] at [place] a Provider at this Location  [address], [place], [place] [address] . 8 am to 8 pm Monday-Friday . 9 am to 6 pm Saturday . 11 am to 6 pm Sunday   . [facility]  Health Urgent Care at [place] Get Driving Directions  [address] Suite [place], [place] [address] . 8 am to 8 pm Monday-Friday . 8 am to 4 pm Saturday-Sunday   Your e-visit answers were reviewed by a board certified advanced clinical practitioner to complete your personal care plan.  Thank you for using e-Visits.  Greater than 5 minutes, yet less than 10 minutes of time have been spent researching, coordinating, and implementing care for this patient today.  Thank you for the details you included in the comment boxes. Those details are very helpful in determining the best course of treatment for you and help [place] to provide the best care.

## 2018-07-21 NOTE — Progress Notes (Signed)
Subjective:    CC: Still feeling sick  HPI: This is a 48 year old female, we recently did a virtual visit yesterday, she was having fevers, chills, muscle aches, body aches.  I empirically called in Tamiflu, she has been doing ibuprofen, Tylenol, she tells me symptoms of worsened today, worsening fevers, chills, very fatigued.  She desires additional testing.  I agreed to see her and do a drive-by test.  I reviewed the past medical history, family history, social history, surgical history, and allergies today and no changes were needed.  Please see the problem list section below in epic for further details.  Past Medical History: Past Medical History:  Diagnosis Date  . Depression   . Obesity (BMI 35.0-39.9 without comorbidity)    Past Surgical History: Past Surgical History:  Procedure Laterality Date  . CHOLECYSTECTOMY    . lumbar microdiskectomy     Social History: Social History   Socioeconomic History  . Marital status: Divorced    Spouse name: Not on file  . Number of children: Not on file  . Years of education: Not on file  . Highest education level: Not on file  Occupational History  . Not on file  Social Needs  . Financial resource strain: Not on file  . Food insecurity:    Worry: Not on file    Inability: Not on file  . Transportation needs:    Medical: Not on file    Non-medical: Not on file  Tobacco Use  . Smoking status: Former Research scientist (life sciences)  . Smokeless tobacco: Never Used  Substance and Sexual Activity  . Alcohol use: Yes    Alcohol/week: 0.0 standard drinks  . Drug use: No  . Sexual activity: Yes  Lifestyle  . Physical activity:    Days per week: Not on file    Minutes per session: Not on file  . Stress: Not on file  Relationships  . Social connections:    Talks on phone: Not on file    Gets together: Not on file    Attends religious service: Not on file    Active member of club or organization: Not on file    Attends meetings of clubs or  organizations: Not on file    Relationship status: Not on file  Other Topics Concern  . Not on file  Social History Narrative  . Not on file   Family History: Family History  Problem Relation Age of Onset  . Hypertension Mother   . Alcohol abuse Father   . Heart attack Father   . Depression Father   . Parkinson's disease Father   . Aneurysm Maternal Grandfather   . Stroke Paternal Grandmother   . Heart attack Paternal Grandfather   . Cancer Neg Hx    Allergies: Allergies  Allergen Reactions  . Belviq [Lorcaserin Hcl]     Made eyes crossed.    Medications: See med rec.  Review of Systems: No fevers, chills, night sweats, weight loss, chest pain, or shortness of breath.   Objective:    General: Appears ill. Neuro: Alert and oriented x3, extra-ocular muscles intact, sensation grossly intact.  HEENT: Normocephalic, atraumatic, pupils equal round reactive to light, neck supple, no masses, no lymphadenopathy, thyroid nonpalpable.  Skin: Warm and dry, no rashes. Cardiac: Regular rate and rhythm, no murmurs rubs or gallops, no lower extremity edema.  Respiratory: Clear to auscultation bilaterally. Not using accessory muscles, speaking in full sentences.  Drive by nasopharyngeal swab: While gowned, gloved, with the mask, and  goggles I swabbed her nasopharynx bilaterally and deeply for influenza and COVID-19.  Rapid influenza test did come back immediately positive for influenza B.  Impression and Recommendations:    Influenza B Persistent, worsening fevers, chills. We had her do a drive-by swab for flu and coronavirus swabs. She did not enter the building and I was gowned and gloved with the mask and goggles. Significant erythematous tonsils with exudates. I am going to treat her empirically for streptococcal tonsillopharyngitis with Keflex. SARS-CoV-2 19 swab will be sent off stat. Switching from ibuprofen to Voltaren.  Flu swab is positive for influenza B, she should  already have the Tamiflu. Patient did not get her flu shot this year.   ___________________________________________ Gwen Her. Dianah Field, M.D., ABFM., CAQSM. Primary Care and Sports Medicine [facility]  Adjunct Professor of [place] [employer] [facility] of Medicine

## 2018-07-21 NOTE — Telephone Encounter (Signed)
Do we have any viral covid cultures in the office?

## 2018-07-21 NOTE — Telephone Encounter (Signed)
See if in house provider ok with swabbing. Pt is requesting? Dr. Darene Lamer saw her virtually yesterday. She is wanting to know due to putting others at risk?

## 2018-07-21 NOTE — Assessment & Plan Note (Addendum)
Persistent, worsening fevers, chills. We had her do a drive-by swab for flu and coronavirus swabs. She did not enter the building and I was gowned and gloved with the mask and goggles. Significant erythematous tonsils with exudates. I am going to treat her empirically for streptococcal tonsillopharyngitis with Keflex. SARS-CoV-2 19 swab will be sent off stat. Switching from ibuprofen to Voltaren.  Flu swab is positive for influenza B, she should already have the Tamiflu. Patient did not get her flu shot this year.

## 2018-07-21 NOTE — Telephone Encounter (Signed)
This patient wants to be tested. Will you see if T or metheney will test her today? Maybe start with Dr. Darene Lamer since he saw her virtually yesterday.

## 2018-07-25 LAB — SARS-COV-2 RNA,(COVID-19) QUALITATIVE NAAT: SARS CoV2 RNA: NOT DETECTED

## 2018-08-21 DIAGNOSIS — S93601A Unspecified sprain of right foot, initial encounter: Secondary | ICD-10-CM | POA: Insufficient documentation

## 2018-09-20 DIAGNOSIS — S82822A Torus fracture of lower end of left fibula, initial encounter for closed fracture: Secondary | ICD-10-CM | POA: Insufficient documentation

## 2018-11-08 ENCOUNTER — Encounter: Payer: Self-pay | Admitting: Physician Assistant

## 2018-11-11 ENCOUNTER — Other Ambulatory Visit: Payer: Self-pay | Admitting: Sports Medicine

## 2018-11-11 DIAGNOSIS — J111 Influenza due to unidentified influenza virus with other respiratory manifestations: Secondary | ICD-10-CM

## 2018-11-29 ENCOUNTER — Other Ambulatory Visit: Payer: Self-pay | Admitting: Sports Medicine

## 2018-11-29 DIAGNOSIS — R509 Fever, unspecified: Secondary | ICD-10-CM

## 2019-01-01 ENCOUNTER — Encounter: Payer: Self-pay | Admitting: Physician Assistant

## 2019-03-20 ENCOUNTER — Encounter: Payer: Self-pay | Admitting: Physician Assistant

## 2019-03-20 NOTE — Telephone Encounter (Signed)
Please schedule for virtual visit tomorrow.

## 2019-03-21 ENCOUNTER — Other Ambulatory Visit: Payer: Self-pay | Admitting: Sports Medicine

## 2019-03-21 ENCOUNTER — Ambulatory Visit (INDEPENDENT_AMBULATORY_CARE_PROVIDER_SITE_OTHER): Payer: Managed Care, Other (non HMO) | Admitting: Physician Assistant

## 2019-03-21 ENCOUNTER — Other Ambulatory Visit: Payer: Self-pay

## 2019-03-21 ENCOUNTER — Ambulatory Visit (INDEPENDENT_AMBULATORY_CARE_PROVIDER_SITE_OTHER): Payer: Managed Care, Other (non HMO)

## 2019-03-21 ENCOUNTER — Encounter: Payer: Self-pay | Admitting: Physician Assistant

## 2019-03-21 VITALS — HR 89 | Temp 97.6°F | Ht 65.0 in | Wt 235.0 lb

## 2019-03-21 DIAGNOSIS — J111 Influenza due to unidentified influenza virus with other respiratory manifestations: Secondary | ICD-10-CM

## 2019-03-21 DIAGNOSIS — R059 Cough, unspecified: Secondary | ICD-10-CM

## 2019-03-21 DIAGNOSIS — R5383 Other fatigue: Secondary | ICD-10-CM | POA: Diagnosis not present

## 2019-03-21 DIAGNOSIS — R0789 Other chest pain: Secondary | ICD-10-CM

## 2019-03-21 DIAGNOSIS — R05 Cough: Secondary | ICD-10-CM | POA: Diagnosis not present

## 2019-03-21 DIAGNOSIS — Z20822 Contact with and (suspected) exposure to covid-19: Secondary | ICD-10-CM

## 2019-03-21 DIAGNOSIS — Z8619 Personal history of other infectious and parasitic diseases: Secondary | ICD-10-CM

## 2019-03-21 MED ORDER — ALBUTEROL SULFATE HFA 108 (90 BASE) MCG/ACT IN AERS: 2.0000 | INHALATION_SPRAY | Freq: Four times a day (QID) | RESPIRATORY_TRACT | 0 refills | Status: DC | PRN

## 2019-03-21 MED ORDER — BENZONATATE 200 MG PO CAPS: 200.0000 mg | ORAL_CAPSULE | Freq: Two times a day (BID) | ORAL | 0 refills | Status: DC | PRN

## 2019-03-21 NOTE — Progress Notes (Signed)
Patient ID: Julie Johnston, female   DOB: 1970/09/17, 48 y.o.   MRN: [id number] .[name] KitchenVirtual Visit via Video Note  I connected with Julie Johnston on 03/21/19 at  8:10 AM EST by a video enabled telemedicine application and verified that I am speaking with the correct person using two identifiers.  Location: Patient: home Provider: clinic   I discussed the limitations of evaluation and management by telemedicine and the availability of in person appointments. The patient expressed understanding and agreed to proceed.  History of Present Illness: Patient is a 48 year old female with chronic fatigue, Lyme's disease, history of frequent infections who calls into the clinic to discuss some recent symptoms.  She is concerned because over the last 2 days she has been a lot more tired than her average self.  She is also noticed a dry to mildly productive cough.  She is starting to feel just unwell overall.  She is more concerned with a sensation of heaviness in her chest and some crackling with deep breathing.  She does not have any asthma or chronic lung diseases.  She has had pneumonia multiple times and had influenza earlier this year.  She denies any fever, loss of smell or taste, GI symptoms.  Patient does work from home.  She has been pretty conservative and where she has been going lately.  She has no direct Covid contacts.  Patient is using Mucinex over-the-counter which is helping some.  She is not using her albuterol at this time.  She is more concerned because of the holiday weekend and an infection that could be occurring.  .. Active Ambulatory Problems    Diagnosis Date Noted  . Lyme disease 05/09/2015  . Insomnia 05/09/2015  . Palpitations 05/09/2015  . Chronic fatigue 05/09/2015  . No energy 05/09/2015  . Hyperlipidemia 05/09/2015  . Abnormal Pap smear of cervix 05/09/2015  . Attention deficit hyperactivity disorder (ADHD), predominantly inattentive type 05/09/2015  . MDD (major  depressive disorder), recurrent episode, moderate ([place]) 05/09/2015  . Pre-diabetes 05/14/2015  . Obstructive sleep apnea 06/18/2015  . Urine ketones 09/22/2015  . Fibromyalgia 09/22/2015  . Obesity 09/22/2015  . Drug-induced weight gain 02/23/2016  . Migraine 11/29/2017  . Dysuria 11/29/2017  . Potential exposure to STD 12/16/2017  . Frequent infections 05/08/2018  . Decreased thyroid stimulating hormone (TSH) level 05/08/2018  . Equivocal stress echocardiogram 05/17/2018  . Influenza-like illness 07/20/2018   Resolved Ambulatory Problems    Diagnosis Date Noted  . Influenza B 12/16/2017   Past Medical History:  Diagnosis Date  . Depression   . Obesity (BMI 35.0-39.9 without comorbidity)    Reviewed med, allergy, problem list.     Observations/Objective: No acute distress. Dry to mildly productive cough on exam.  No labored breathing or wheezing.  Normal appearance.   .. Today's Vitals   03/21/19 0739  Pulse: 89  Temp: 97.6 F (36.4 C)  TempSrc: Oral  SpO2: 98%  Weight: 235 lb (106.6 kg)  Height: 5\' 5"  (1.651 m)   Body mass index is 39.11 kg/m.     Assessment and Plan: [name] KitchenMarland KitchenKwynn was seen today for follow-up.  Diagnoses and all orders for this visit:  Suspected COVID-19 virus infection -     CXR 2 view -     benzonatate (TESSALON) 200 MG capsule; Take 1 capsule (200 mg total) by mouth 2 (two) times daily as needed for cough. -     albuterol (VENTOLIN HFA) 108 (90 Base) MCG/ACT inhaler; Inhale 2 puffs into the  lungs every 6 (six) hours as needed. -     Novel Coronavirus, NAA ([facility])  Frequent infections -     Novel Coronavirus, NAA ([facility])  Cough -     CXR 2 view -     benzonatate (TESSALON) 200 MG capsule; Take 1 capsule (200 mg total) by mouth 2 (two) times daily as needed for cough. -     albuterol (VENTOLIN HFA) 108 (90 Base) MCG/ACT inhaler; Inhale 2 puffs into the lungs every 6 (six) hours as needed. -     Novel Coronavirus, NAA  ([facility])   Patient does have Covid symptoms.  I would like for her to self isolate and come to the drive-through to be Covid tested here at the office.  Since she does have a history of pneumonia and other infections and she is having the wheezing and crackling we will get a chest x-ray today.  Symptomatic care discussed in office with continuing Mucinex, vitamin C, zinc, Tessalon Perles for cough, albuterol as needed.  She has any sudden shortness of breath or worsening breathing go to the emergency room.  She does have a pulse ox at home and anything under 90 would be concerning.  Stat chest x-ray was within normal limits and no acute findings.   Follow Up Instructions:    I discussed the assessment and treatment plan with the patient. The patient was provided an opportunity to ask questions and all were answered. The patient agreed with the plan and demonstrated an understanding of the instructions.   The patient was advised to call back or seek an in-person evaluation if the symptoms worsen or if the condition fails to improve as anticipated.   Iran Planas, PA-C

## 2019-03-21 NOTE — Progress Notes (Signed)
Yesterday felt okay when she woke up, but gets worse as the day goes on Sensation of "heaviness and crackling" in upper chest Minor cough Was sick 6 times last week and wanted to catch it before it turns into something worse

## 2019-03-21 NOTE — Progress Notes (Signed)
Anda Kraft,   CXR looks great. No abnormality.   Luvenia Starch

## 2019-03-22 ENCOUNTER — Encounter: Payer: Self-pay | Admitting: Physician Assistant

## 2019-03-23 LAB — NOVEL CORONAVIRUS, NAA: SARS-CoV-2, NAA: NOT DETECTED

## 2019-04-08 ENCOUNTER — Other Ambulatory Visit: Payer: Self-pay | Admitting: Physician Assistant

## 2019-04-08 DIAGNOSIS — Z20822 Contact with and (suspected) exposure to covid-19: Secondary | ICD-10-CM

## 2019-04-08 DIAGNOSIS — R059 Cough, unspecified: Secondary | ICD-10-CM

## 2019-04-08 DIAGNOSIS — R05 Cough: Secondary | ICD-10-CM

## 2019-05-29 ENCOUNTER — Encounter: Payer: Self-pay | Admitting: Physician Assistant

## 2019-05-30 MED ORDER — LOSARTAN POTASSIUM 25 MG PO TABS: 25.0000 mg | ORAL_TABLET | Freq: Every day | ORAL | 0 refills | Status: DC

## 2019-06-01 ENCOUNTER — Other Ambulatory Visit: Payer: Self-pay

## 2019-06-01 ENCOUNTER — Ambulatory Visit (INDEPENDENT_AMBULATORY_CARE_PROVIDER_SITE_OTHER): Payer: Managed Care, Other (non HMO) | Admitting: Physician Assistant

## 2019-06-01 VITALS — BP 128/74 | HR 117 | Ht 65.0 in | Wt 261.0 lb

## 2019-06-01 DIAGNOSIS — I10 Essential (primary) hypertension: Secondary | ICD-10-CM

## 2019-06-01 DIAGNOSIS — Z1231 Encounter for screening mammogram for malignant neoplasm of breast: Secondary | ICD-10-CM

## 2019-06-04 ENCOUNTER — Encounter: Payer: Self-pay | Admitting: Physician Assistant

## 2019-06-04 DIAGNOSIS — I10 Essential (primary) hypertension: Secondary | ICD-10-CM | POA: Insufficient documentation

## 2019-06-04 HISTORY — DX: Essential (primary) hypertension: I10

## 2019-06-04 NOTE — Progress Notes (Signed)
   Subjective:    Patient ID: Julie Johnston, female    DOB: 07-Feb-1971, 49 y.o.   MRN: [id number]  HPI  Pt is a 49 yo female who called into the clinic concerned with bP readings. She is trying to take a new BH infusion for her depression but it can cause her BP to increase and was too high at last attempt for infusion. She was started on chlorthalidone but readings still in 150s over 90s. Denies any CP, palpitations, headaaches or vision changes. losaartan called in. She started it and bP has began to drop but she has also had really low readings of 80s over 60s. She does feel weak during this episodes.   .. Active Ambulatory Problems    Diagnosis Date Noted  . Lyme disease 05/09/2015  . Insomnia 05/09/2015  . Palpitations 05/09/2015  . Chronic fatigue 05/09/2015  . No energy 05/09/2015  . Hyperlipidemia 05/09/2015  . Abnormal Pap smear of cervix 05/09/2015  . Attention deficit hyperactivity disorder (ADHD), predominantly inattentive type 05/09/2015  . MDD (major depressive disorder), recurrent episode, moderate ([place]) 05/09/2015  . Pre-diabetes 05/14/2015  . Obstructive sleep apnea 06/18/2015  . Urine ketones 09/22/2015  . Fibromyalgia 09/22/2015  . Obesity 09/22/2015  . Drug-induced weight gain 02/23/2016  . Migraine 11/29/2017  . Dysuria 11/29/2017  . Potential exposure to STD 12/16/2017  . Frequent infections 05/08/2018  . Decreased thyroid stimulating hormone (TSH) level 05/08/2018  . Equivocal stress echocardiogram 05/17/2018  . Influenza-like illness 07/20/2018  . Essential hypertension 06/04/2019   Resolved Ambulatory Problems    Diagnosis Date Noted  . Influenza B 12/16/2017   Past Medical History:  Diagnosis Date  . Depression   . Obesity (BMI 35.0-39.9 without comorbidity)        Review of Systems See HPI.     Objective:   Physical Exam Vitals reviewed.  Constitutional:      Appearance: Normal appearance. She is obese.  Cardiovascular:     Rate and  Rhythm: Normal rate and regular rhythm.     Pulses: Normal pulses.  Pulmonary:     Effort: Pulmonary effort is normal.  Neurological:     General: No focal deficit present.     Mental Status: She is alert and oriented to person, place, and time.  Psychiatric:        Mood and Affect: Mood normal.           Assessment & Plan:  [name] KitchenMarland KitchenKaydense was seen today for hypertension.  Diagnoses and all orders for this visit:  Essential hypertension  Visit for screening mammogram -     MM 3D SCREEN BREAST BILATERAL   BP is better but also having some hypotension episodes. Discussed what to do during episode. Cut losartan in half and take with chlorthalidone. Recheck in 1 month. Keep log.

## 2019-06-13 ENCOUNTER — Other Ambulatory Visit: Payer: Self-pay

## 2019-06-13 ENCOUNTER — Ambulatory Visit (INDEPENDENT_AMBULATORY_CARE_PROVIDER_SITE_OTHER): Payer: BC Managed Care – PPO

## 2019-06-13 DIAGNOSIS — Z1231 Encounter for screening mammogram for malignant neoplasm of breast: Secondary | ICD-10-CM | POA: Diagnosis not present

## 2019-06-14 NOTE — Progress Notes (Signed)
Normal mammogram. Follow up in 1 year.

## 2019-06-18 ENCOUNTER — Encounter: Payer: Self-pay | Admitting: Neurology

## 2019-06-21 ENCOUNTER — Other Ambulatory Visit: Payer: Self-pay | Admitting: Physician Assistant

## 2019-06-25 ENCOUNTER — Other Ambulatory Visit: Payer: Self-pay | Admitting: Sports Medicine

## 2019-06-25 DIAGNOSIS — J111 Influenza due to unidentified influenza virus with other respiratory manifestations: Secondary | ICD-10-CM

## 2019-07-03 ENCOUNTER — Encounter: Payer: Self-pay | Admitting: Physician Assistant

## 2019-07-03 DIAGNOSIS — I10 Essential (primary) hypertension: Secondary | ICD-10-CM

## 2019-07-03 MED ORDER — ROSUVASTATIN CALCIUM 10 MG PO TABS: 10.0000 mg | ORAL_TABLET | Freq: Every day | ORAL | 1 refills | Status: DC

## 2019-07-03 MED ORDER — CHLORTHALIDONE 25 MG PO TABS: 25.0000 mg | ORAL_TABLET | Freq: Every day | ORAL | 1 refills | Status: DC

## 2019-07-03 NOTE — Telephone Encounter (Signed)
Routing to provider. Written by historical provider. Rxs pended.

## 2019-07-19 ENCOUNTER — Other Ambulatory Visit: Payer: Self-pay | Admitting: Physician Assistant

## 2019-08-12 ENCOUNTER — Encounter: Payer: Self-pay | Admitting: Physician Assistant

## 2019-08-15 ENCOUNTER — Encounter: Payer: Self-pay | Admitting: Physician Assistant

## 2019-08-15 MED ORDER — NORETHIN ACE-ETH ESTRAD-FE 1-20 MG-MCG PO TABS: ORAL_TABLET | ORAL | 0 refills | Status: DC

## 2019-08-16 ENCOUNTER — Telehealth: Payer: Self-pay | Admitting: Obstetrics and Gynecology

## 2019-08-16 ENCOUNTER — Encounter: Payer: Self-pay | Admitting: *Deleted

## 2019-08-16 NOTE — Telephone Encounter (Signed)
Reached out to patient regarding correspondence yesterday regarding refill of OCPs (which I declined due to hypertension). I apologized for the way the situation was handled and that I always want to ensure our patients are heard and feel their needs are addressed. She reiterated that she was concerned the the CMA did not relay appropriate information regarding her mental health, I told her we would take that into consideration. She accepted apology, stated she appreciated me reaching out. She has an appointment with a new OB/GYN.   Feliz Beam, M.D. Attending Center for [employer])

## 2019-08-23 ENCOUNTER — Other Ambulatory Visit: Payer: Self-pay | Admitting: Physician Assistant

## 2019-08-27 ENCOUNTER — Encounter: Payer: Self-pay | Admitting: Physician Assistant

## 2019-09-03 ENCOUNTER — Other Ambulatory Visit: Payer: Self-pay | Admitting: Physician Assistant

## 2019-09-12 DIAGNOSIS — S069X9A Unspecified intracranial injury with loss of consciousness of unspecified duration, initial encounter: Secondary | ICD-10-CM

## 2019-09-12 DIAGNOSIS — S069XAA Unspecified intracranial injury with loss of consciousness status unknown, initial encounter: Secondary | ICD-10-CM | POA: Insufficient documentation

## 2019-09-12 HISTORY — DX: Unspecified intracranial injury with loss of consciousness of unspecified duration, initial encounter: S06.9X9A

## 2019-09-12 HISTORY — DX: Unspecified intracranial injury with loss of consciousness status unknown, initial encounter: S06.9XAA

## 2019-09-17 ENCOUNTER — Emergency Department
Admission: EM | Admit: 2019-09-17 | Discharge: 2019-09-17 | Disposition: A | Discharge status: Home / Self Care | Payer: BC Managed Care – PPO | Source: Home / Self Care

## 2019-09-17 ENCOUNTER — Encounter: Payer: Self-pay | Admitting: Emergency Medicine

## 2019-09-17 ENCOUNTER — Encounter: Payer: BC Managed Care – PPO | Admitting: Obstetrics and Gynecology

## 2019-09-17 ENCOUNTER — Emergency Department (INDEPENDENT_AMBULATORY_CARE_PROVIDER_SITE_OTHER): Payer: BC Managed Care – PPO

## 2019-09-17 ENCOUNTER — Other Ambulatory Visit: Payer: Self-pay

## 2019-09-17 ENCOUNTER — Encounter: Payer: Self-pay | Admitting: Physician Assistant

## 2019-09-17 DIAGNOSIS — W230XXA Caught, crushed, jammed, or pinched between moving objects, initial encounter: Secondary | ICD-10-CM

## 2019-09-17 DIAGNOSIS — M546 Pain in thoracic spine: Secondary | ICD-10-CM | POA: Diagnosis not present

## 2019-09-17 DIAGNOSIS — R0781 Pleurodynia: Secondary | ICD-10-CM

## 2019-09-17 MED ORDER — CYCLOBENZAPRINE HCL 5 MG PO TABS: 5.0000 mg | ORAL_TABLET | Freq: Two times a day (BID) | ORAL | 0 refills | Status: DC | PRN

## 2019-09-17 MED ORDER — METHYLPREDNISOLONE ACETATE 80 MG/ML IJ SUSP
80.0000 mg | Freq: Once | INTRAMUSCULAR | Status: AC
  Administered 2019-09-17: 80 mg via INTRAMUSCULAR

## 2019-09-17 NOTE — ED Triage Notes (Signed)
RT rib injury 6 days ago, working with horses

## 2019-09-17 NOTE — Discharge Instructions (Signed)
  You may take 500mg acetaminophen every 4-6 hours or in combination with ibuprofen 400-600mg every 6-8 hours as needed for pain and inflammation.   Call to schedule a follow up appointment with Sports Medicine in 1-2 weeks if not improving.  

## 2019-09-17 NOTE — ED Provider Notes (Signed)
[facility]    CSN: [id number] Arrival date & time: 09/17/19  1137      History   Chief Complaint Chief Complaint  Patient presents with  . Rib Injury    HPI Julie Johnston is a 49 y.o. female.   HPI Julie Johnston is a 49 y.o. female presenting to UC with c/o Right upper to mid back pain and Right side chest wall pain that started about 6 days ago after a horse "crushed" her into a wall. Pt reports hearing "cracking and popping" but denies much pain at that time. Pain has gradually worsened. She called her chiropractor who wanted her to be evaluated by her doctor first. Pt called her PCP who recommended she be evaluated in UC. Pt denies radiation of pain or numbness in arms or legs. No change in bowel or bladder habits. She takes 800mg  daily for chronic Left ankle pain due to a fracture several years ago but has not tried anything else for the 6/10 pain.   Past Medical History:  Diagnosis Date  . Depression   . Obesity (BMI 35.0-39.9 without comorbidity)     Patient Active Problem List   Diagnosis Date Noted  . Essential hypertension 06/04/2019  . Influenza-like illness 07/20/2018  . Equivocal stress echocardiogram 05/17/2018  . Frequent infections 05/08/2018  . Decreased thyroid stimulating hormone (TSH) level 05/08/2018  . Potential exposure to STD 12/16/2017  . Migraine 11/29/2017  . Dysuria 11/29/2017  . Drug-induced weight gain 02/23/2016  . Urine ketones 09/22/2015  . Fibromyalgia 09/22/2015  . Obesity 09/22/2015  . Obstructive sleep apnea 06/18/2015  . Pre-diabetes 05/14/2015  . Lyme disease 05/09/2015  . Insomnia 05/09/2015  . Palpitations 05/09/2015  . Chronic fatigue 05/09/2015  . No energy 05/09/2015  . Hyperlipidemia 05/09/2015  . Abnormal Pap smear of cervix 05/09/2015  . Attention deficit hyperactivity disorder (ADHD), predominantly inattentive type 05/09/2015  . MDD (major depressive disorder), recurrent episode, moderate ([place])  05/09/2015    Past Surgical History:  Procedure Laterality Date  . BACK SURGERY    . CHOLECYSTECTOMY    . lumbar microdiskectomy      OB History    Gravida  0   Para  0   Term  0   Preterm  0   AB  0   Living  0     SAB  0   TAB  0   Ectopic  0   Multiple  0   Live Births               Home Medications    Prior to Admission medications   Medication Sig Start Date End Date Taking? Authorizing Provider  albuterol (VENTOLIN HFA) 108 (90 Base) MCG/ACT inhaler Inhale 2 puffs into the lungs every 6 (six) hours as needed for wheezing or shortness of breath. APPT FOR FURTHER REFILLS 04/09/19   Donella Stade, PA-C  buPROPion (WELLBUTRIN XL) 300 MG 24 hr tablet Take 300 mg by mouth daily.    [provider]  chlorthalidone (HYGROTON) 25 MG tablet Take 1 tablet (25 mg total) by mouth daily. 07/03/19   Breeback, Royetta Car, PA-C  cyclobenzaprine (FLEXERIL) 5 MG tablet Take 1-2 tablets (5-10 mg total) by mouth 2 (two) times daily as needed for muscle spasms. 09/17/19   Noe Gens, PA-C  gabapentin (NEURONTIN) 600 MG tablet TAKE 1/2 TO 1 TABLET BY MOUTH EVERY 8 HOURS 02/18/17   [provider]  ibuprofen (ADVIL) 800 MG tablet TAKE  1 TABLET BY MOUTH EVERY 8 HOURS AS NEEDED 06/25/19   Silverio Decamp, MD  lamoTRIgine (LAMICTAL) 200 MG tablet Take 200 mg by mouth daily. 12/18/16   [provider]  losartan (COZAAR) 25 MG tablet TAKE 1 TABLET (25 MG TOTAL) BY MOUTH DAILY. NEEDS LABS 09/03/19   Donella Stade, PA-C  norethindrone-ethinyl estradiol (JUNEL FE 1/20) 1-20 MG-MCG tablet TAKE A ACTIVE PILL CONTINUOUSLY. SHE WILL NEED TO PICK UP PACK SOONER THAN USUAL. 08/15/19   Breeback, Jade L, PA-C  rizatriptan (MAXALT-MLT) 10 MG disintegrating tablet Take 1 tablet (10 mg total) by mouth as needed for migraine. May repeat in 2 hours if needed 11/29/17   Silverio Decamp, MD  rosuvastatin (CRESTOR) 10 MG tablet Take 1 tablet (10 mg total) by mouth at  bedtime. 07/03/19   Breeback, Jade L, PA-C  traZODone (DESYREL) 50 MG tablet Take 50 mg by mouth at bedtime. 07/16/16   [provider]  valACYclovir (VALTREX) 1000 MG tablet Take 2 tablets (2,000 mg total) by mouth 2 (two) times daily. For one day. 02/01/18   Breeback, Jade L, PA-C  VYVANSE 60 MG capsule Take 60 mg by mouth every morning. 03/16/19   [provider]    Family History Family History  Problem Relation Age of Onset  . Hypertension Mother   . Alcohol abuse Father   . Heart attack Father   . Depression Father   . Parkinson's disease Father   . Aneurysm Maternal Grandfather   . Stroke Paternal Grandmother   . Heart attack Paternal Grandfather   . Cancer Neg Hx     Social History Social History   Tobacco Use  . Smoking status: Former Research scientist (life sciences)  . Smokeless tobacco: Never Used  Vaping Use  . Vaping Use: Never used  Substance Use Topics  . Alcohol use: Yes    Alcohol/week: 0.0 standard drinks  . Drug use: No     Allergies   Belviq [lorcaserin hcl]   Review of Systems Review of Systems  Respiratory: Negative for chest tightness and shortness of breath.   Cardiovascular: Positive for chest pain (Right side/ribs).  Musculoskeletal: Positive for back pain and myalgias. Negative for neck pain and neck stiffness.  Skin: Negative for color change and wound.  Neurological: Negative for weakness and numbness.     Physical Exam Triage Vital Signs ED Triage Vitals  Enc Vitals Group     BP 09/17/19 1143 (!) 157/95     Pulse Rate 09/17/19 1143 (!) 111     Resp --      Temp 09/17/19 1143 98.2 F (36.8 C)     Temp Source 09/17/19 1143 Oral     SpO2 09/17/19 1143 96 %     Weight 09/17/19 1144 275 lb (124.7 kg)     Height 09/17/19 1144 5\' 5"  (1.651 m)     Head Circumference --      Peak Flow --      Pain Score 09/17/19 1144 6     Pain Loc --      Pain Edu? --      Excl. in [place]? --    No data found.  Updated Vital Signs BP (!) 142/82 (BP  Location: Right Arm)   Pulse (!) 109   Temp 98.2 F (36.8 C) (Oral)   Ht 5\' 5"  (1.651 m)   Wt 275 lb (124.7 kg)   SpO2 97%   BMI 45.76 kg/m   Visual Acuity Right Eye Distance:  Left Eye Distance:   Bilateral Distance:    Right Eye Near:   Left Eye Near:    Bilateral Near:     Physical Exam Vitals and nursing note reviewed.  Constitutional:      Appearance: Normal appearance. She is well-developed.  HENT:     Head: Normocephalic and atraumatic.  Neck:     Comments: No midline bone tenderness, no crepitus or step-offs.  Cardiovascular:     Rate and Rhythm: Normal rate and regular rhythm.     Pulses:          Radial pulses are 2+ on the right side and 2+ on the left side.  Pulmonary:     Effort: Pulmonary effort is normal. No respiratory distress.     Breath sounds: Normal breath sounds.    Chest:     Chest wall: Tenderness present.    Musculoskeletal:        General: Tenderness present. Normal range of motion.     Cervical back: Normal range of motion and neck supple.     Comments: Tenderness to upper and mid thoracic spine and Right side thoracic muscles. Full ROM upper and lower extremities.  Skin:    General: Skin is warm and dry.  Neurological:     Mental Status: She is alert and oriented to person, place, and time.  Psychiatric:        Behavior: Behavior normal.      UC Treatments / Results  Labs (all labs ordered are listed, but only abnormal results are displayed) Labs Reviewed - No data to display  EKG   Radiology DG Ribs Unilateral W/Chest Right  Result Date: 09/17/2019 CLINICAL DATA:  Chest pain after recent compression type injury EXAM: RIGHT RIBS AND CHEST - 3+ VIEW COMPARISON:  March 21, 2019 chest radiograph. FINDINGS: Frontal chest as well as oblique and cone-down rib images were obtained. Lungs are clear. Heart size and pulmonary vascularity are normal. No adenopathy. No evident pneumothorax or pleural effusion. No rib fracture  appreciable. IMPRESSION: No appreciable rib fracture.  Lungs clear. Electronically Signed   By: Lowella Grip III M.D.   On: 09/17/2019 12:30   DG Thoracic Spine W/Swimmers  Result Date: 09/17/2019 CLINICAL DATA:  Pain after compression type injury EXAM: THORACIC SPINE - 3 VIEWS COMPARISON:  None. FINDINGS: Frontal, lateral, and swimmer's views were obtained. No evident fracture or spondylolisthesis in the thoracic region. There is apparent degree of anterior wedging at L2 and L4, however. There is disc space narrowing at several levels in the thoracic spine as well as in the lower cervical region. No erosive change or paraspinous lesion. Visualized lungs clear. IMPRESSION: No thoracic region fracture is appreciable. Note, however, there there is a degree of anterior wedging at L2 and L4 of uncertain age. Correlation with lumbar radiographs in this regard may be advisable. Osteoarthritic change at several levels in the cervical and thoracic region. Electronically Signed   By: Lowella Grip III M.D.   On: 09/17/2019 12:32    Procedures Procedures (including critical care time)  Medications Ordered in UC Medications  methylPREDNISolone acetate (DEPO-MEDROL) injection 80 mg (80 mg Intramuscular Given 09/17/19 1256)    Initial Impression / Assessment and Plan / UC Course  I have reviewed the triage vital signs and the nursing notes.  Pertinent labs & imaging results that were available during my care of the patient were reviewed by me and considered in my medical decision making (see chart for details).  Discussed imaging with pt Pt reports remote hx of lumbar compression fractures, denies lower back pain.  Encouraged f/u with PCP or Sports Medicine if pain persists over 1-2 weeks Depo-medrol 80mg  IM given Rx: flexeril, pt aware this medication causes her fatigue. Asked for only a few tabs "just in case" AVS given  Final Clinical Impressions(s) / UC Diagnoses   Final diagnoses:   Right-sided thoracic back pain  Midline thoracic back pain  Rib pain on right side     Discharge Instructions      You may take 500mg  acetaminophen every 4-6 hours or in combination with ibuprofen 400-600mg  every 6-8 hours as needed for pain and inflammation.  Call to schedule a follow up appointment with Sports Medicine in 1-2 weeks if not improving.      ED Prescriptions    Medication Sig Dispense Auth. Provider   cyclobenzaprine (FLEXERIL) 5 MG tablet Take 1-2 tablets (5-10 mg total) by mouth 2 (two) times daily as needed for muscle spasms. 8 tablet Noe Gens, [place]     PDMP not reviewed this encounter.   Noe Gens, [place] 09/17/19 1331

## 2019-09-18 LAB — HM PAP SMEAR: HM Pap smear: NEGATIVE

## 2019-09-25 ENCOUNTER — Encounter: Payer: Self-pay | Admitting: Physician Assistant

## 2019-09-27 ENCOUNTER — Other Ambulatory Visit: Payer: Self-pay | Admitting: Sports Medicine

## 2019-09-27 DIAGNOSIS — J111 Influenza due to unidentified influenza virus with other respiratory manifestations: Secondary | ICD-10-CM

## 2019-09-28 ENCOUNTER — Ambulatory Visit (INDEPENDENT_AMBULATORY_CARE_PROVIDER_SITE_OTHER): Payer: BC Managed Care – PPO | Admitting: Physician Assistant

## 2019-09-28 VITALS — BP 156/74 | HR 98 | Ht 65.0 in | Wt 279.0 lb

## 2019-09-28 DIAGNOSIS — M546 Pain in thoracic spine: Secondary | ICD-10-CM | POA: Diagnosis not present

## 2019-09-28 DIAGNOSIS — R5382 Chronic fatigue, unspecified: Secondary | ICD-10-CM

## 2019-09-28 DIAGNOSIS — F900 Attention-deficit hyperactivity disorder, predominantly inattentive type: Secondary | ICD-10-CM

## 2019-09-28 DIAGNOSIS — F331 Major depressive disorder, recurrent, moderate: Secondary | ICD-10-CM

## 2019-09-28 DIAGNOSIS — F4310 Post-traumatic stress disorder, unspecified: Secondary | ICD-10-CM | POA: Diagnosis not present

## 2019-09-28 DIAGNOSIS — F0781 Postconcussional syndrome: Secondary | ICD-10-CM

## 2019-09-28 DIAGNOSIS — I10 Essential (primary) hypertension: Secondary | ICD-10-CM | POA: Diagnosis not present

## 2019-09-28 MED ORDER — KETOROLAC TROMETHAMINE 60 MG/2ML IM SOLN
60.0000 mg | Freq: Once | INTRAMUSCULAR | Status: AC
Start: 2019-09-28 — End: 2019-09-28
  Administered 2019-09-28: 60 mg via INTRAMUSCULAR

## 2019-09-28 MED ORDER — TRAMADOL HCL 50 MG PO TABS: 50.0000 mg | ORAL_TABLET | Freq: Three times a day (TID) | ORAL | 0 refills | Status: AC | PRN

## 2019-09-28 NOTE — Progress Notes (Signed)
Injured by horse 2 weeks ago Back pain Confusion Emotional

## 2019-09-28 NOTE — Progress Notes (Signed)
Subjective:    Patient ID: Julie Johnston, female    DOB: July 14, 1970, 50 y.o.   MRN: [id number]  HPI  Patient is a 49 year old female who presents to the clinic to follow-up after accident with a horse.  On 09/17/2019 she is volunteering at a horse farm.  She was trying to get one horse back in the [place] while another horse was also trying to come in.  This spooked the horse and the horse threw her into the stall wall.  She heard a lot of popping and fell to the ground.  This was very traumatic.  She is having a lot of right-sided thoracic back pain, mental fog, fatigue, inability to concentrate, headache, nausea.  She has tried to get in with her psychiatrist but she is on vacation.  X-rays and CT at the ED are negative for fracture and bleeding.  This whole week she has not been able to do anything at work.  She has since bear minimal emails.  Patient does have this ongoing history of her mental health affecting her work ability.  Patient has lost many jobs because of her inability to perform her days missed.   .. Active Ambulatory Problems    Diagnosis Date Noted  . Lyme disease 05/09/2015  . Insomnia 05/09/2015  . Palpitations 05/09/2015  . Chronic fatigue 05/09/2015  . No energy 05/09/2015  . Hyperlipidemia 05/09/2015  . Abnormal Pap smear of cervix 05/09/2015  . Attention deficit hyperactivity disorder (ADHD), predominantly inattentive type 05/09/2015  . MDD (major depressive disorder), recurrent episode, moderate ([place]) 05/09/2015  . Pre-diabetes 05/14/2015  . Obstructive sleep apnea 06/18/2015  . Urine ketones 09/22/2015  . Fibromyalgia 09/22/2015  . Obesity 09/22/2015  . Drug-induced weight gain 02/23/2016  . Migraine 11/29/2017  . Dysuria 11/29/2017  . Potential exposure to STD 12/16/2017  . Frequent infections 05/08/2018  . Decreased thyroid stimulating hormone (TSH) level 05/08/2018  . Equivocal stress echocardiogram 05/17/2018  . Influenza-like illness 07/20/2018  .  Essential hypertension 06/04/2019  . Post concussion syndrome 10/01/2019  . Acute thoracic back pain 10/01/2019  . PTSD (post-traumatic stress disorder) 10/01/2019   Resolved Ambulatory Problems    Diagnosis Date Noted  . Influenza B 12/16/2017   Past Medical History:  Diagnosis Date  . Depression   . Obesity (BMI 35.0-39.9 without comorbidity)       Review of Systems s    Objective:   Physical Exam Vitals reviewed.  Constitutional:      Appearance: Normal appearance. She is obese.  HENT:     Head: Normocephalic.  Eyes:     Extraocular Movements: Extraocular movements intact.     Pupils: Pupils are equal, round, and reactive to light.  Cardiovascular:     Rate and Rhythm: Normal rate and regular rhythm.  Pulmonary:     Effort: Pulmonary effort is normal.     Breath sounds: Normal breath sounds.  Musculoskeletal:     Comments: Pain to palpation over right thoracic area just under shoulder blade.   Neurological:     General: No focal deficit present.     Mental Status: She is alert.     Cranial Nerves: No cranial nerve deficit.     Motor: No weakness.     Coordination: Coordination normal.     Gait: Gait normal.  Psychiatric:     Comments: Flat affect           Assessment & Plan:  [name] KitchenMarland KitchenBrynlynn was seen today for injury.  Diagnoses  and all orders for this visit:  Post concussion syndrome -     ketorolac (TORADOL) injection 60 mg  Acute thoracic back pain, unspecified back pain laterality -     traMADol (ULTRAM) 50 MG tablet; Take 1 tablet (50 mg total) by mouth every 8 (eight) hours as needed for up to 5 days. -     ketorolac (TORADOL) injection 60 mg  PTSD (post-traumatic stress disorder)  Essential hypertension  Attention deficit hyperactivity disorder (ADHD), predominantly inattentive type  Chronic fatigue  MDD (major depressive disorder), recurrent episode, moderate ([facility])   Pt reports inability to concentrate with her current postconcussion  post-rest syndrome symptoms.  wrote out of work for the next week.  Follow-up in 1 week to readdress symptoms.  Strongly encouraged her to follow-up with psychiatry.  Strongly encourage 1 week of brain rest.  Avoid TV, phones, computers, reading.  If she overexerts and starts feeling postconcussion symptoms please stop.  Reassured patient that x-ray showed no fractures.  Certainly at times small hairline fractures are not easily seen on x-ray.  Ice and time should help symptoms.  A few tramadol for acute pain was given today.  I am concerned that this is more her mental health and is part of her ongoing history of not being able to keep down jobs.  Certainly think this is a barrier.  Certainly think there could be some PTSD and this trauma has taken her deeper into her depression.  She is working closely with psychiatry.  I encouraged her to mention this to her psychiatrist.  Since I have been taking care of her she is having multiple job losses over her inability to perform or come to work.

## 2019-09-28 NOTE — Patient Instructions (Signed)
Post-Concussion Syndrome  Post-concussion syndrome is when symptoms last longer than normal after a head injury. What are the signs or symptoms? After a head injury, you may:  Have headaches.  Feel tired.  Feel dizzy.  Feel weak.  Have trouble seeing.  Have trouble in bright lights.  Have trouble hearing.  Not be able to remember things.  Not be able to focus.  Have trouble sleeping.  Have mood swings.  Have trouble learning new things. These can last from weeks to months. Follow these instructions at home: Medicines  Take all medicines only as told by your doctor.  Do not take prescription pain medicines. Activity  Limit activities as told by your doctor. This includes: ? Homework. ? Job-related work. ? Thinking. ? Watching TV. ? Using a computer or phone. ? Puzzles. ? Exercise. ? Sports.  Slowly return to your normal activity as told by your doctor.  Stop an activity if you have symptoms.  Do not do anything that may cause you to get injured again. General instructions  Rest. Try to: ? Sleep 7-9 hours each night. ? Take naps or breaks when you feel tired during the day.  Do not drink alcohol until your doctor says that you can.  Keep track of your symptoms.  Keep all follow-up visits as told by your doctor. This is important. Contact a doctor if:  You do not improve.  You get worse.  You have another injury. Get help right away if:  You have a very bad headache.  You feel confused.  You feel very sleepy.  You pass out (faint).  You throw up (vomit).  You feel weak in any part of your body.  You feel numb in any part of your body.  You start shaking (have a seizure).  You have trouble talking. Summary  Post-concussion syndrome is when symptoms last longer than normal after a head injury.  Limit all activity after your injury. Gradually return to normal activity as told by your doctor.  Rest, do not drink alcohol, and  avoid prescription pain medicines after a concussion.  Call your doctor if your symptoms get worse. This information is not intended to replace advice given to you by your health care provider. Make sure you discuss any questions you have with your health care provider. Document Revised: 12/29/2017 Document Reviewed: 04/12/2017 Elsevier Patient Education  2020 Elsevier Inc.  

## 2019-10-01 ENCOUNTER — Encounter: Payer: Self-pay | Admitting: Physician Assistant

## 2019-10-01 DIAGNOSIS — F4310 Post-traumatic stress disorder, unspecified: Secondary | ICD-10-CM | POA: Insufficient documentation

## 2019-10-01 DIAGNOSIS — M546 Pain in thoracic spine: Secondary | ICD-10-CM | POA: Insufficient documentation

## 2019-10-01 DIAGNOSIS — F0781 Postconcussional syndrome: Secondary | ICD-10-CM | POA: Insufficient documentation

## 2019-10-02 ENCOUNTER — Ambulatory Visit (INDEPENDENT_AMBULATORY_CARE_PROVIDER_SITE_OTHER): Payer: BC Managed Care – PPO

## 2019-10-02 ENCOUNTER — Ambulatory Visit (INDEPENDENT_AMBULATORY_CARE_PROVIDER_SITE_OTHER): Payer: BC Managed Care – PPO | Admitting: Sports Medicine

## 2019-10-02 ENCOUNTER — Other Ambulatory Visit: Payer: Self-pay

## 2019-10-02 DIAGNOSIS — G44319 Acute post-traumatic headache, not intractable: Secondary | ICD-10-CM

## 2019-10-02 DIAGNOSIS — T1490XA Injury, unspecified, initial encounter: Secondary | ICD-10-CM

## 2019-10-02 DIAGNOSIS — S2231XA Fracture of one rib, right side, initial encounter for closed fracture: Secondary | ICD-10-CM | POA: Insufficient documentation

## 2019-10-02 DIAGNOSIS — F331 Major depressive disorder, recurrent, moderate: Secondary | ICD-10-CM | POA: Diagnosis not present

## 2019-10-02 MED ORDER — PREDNISONE 50 MG PO TABS: ORAL_TABLET | ORAL | 0 refills | Status: DC

## 2019-10-02 MED ORDER — VORTIOXETINE HBR 10 MG PO TABS: 10.0000 mg | ORAL_TABLET | Freq: Every day | ORAL | 3 refills | Status: DC

## 2019-10-02 NOTE — Assessment & Plan Note (Signed)
Julie Johnston also has severe depression, she has been doing TMS treatments. She has had problems with SSRIs in the past, mostly anorgasmia and poor libido. She had not heard of Viibryd or Trintellix so we will start Trintellix at 10 mg daily. She has failed multiple other SSRIs, Zoloft, Lexapro, Celexa. She can continue her Wellbutrin, she was placed on Lamictal which has not improved her symptoms at all, she also does not endorse any symptoms of bipolar 1 or 2 disorder so we will discontinue Lamictal. She will keep close follow-up with myself, her PCP, and her psychiatrist.

## 2019-10-02 NOTE — Progress Notes (Addendum)
    Procedures performed today:    None.  Independent interpretation of notes and tests performed by another provider:   We personally reviewed her chest CT, she does have a posterior located minimally displaced right sixth rib fracture.  Brief History, Exam, Impression, and Recommendations:    Right posterior sixth rib fracture Several weeks ago, Julie Johnston was hit by a 1400 pound horse, she has had headaches, dizziness, chest wall pain, back pain, neck pain with radiation to the periscapular region. She did have some x-rays that were unrevealing, I really think she needs a head, cervical spine, chest CT to evaluate for any fractures. This is particular important due to the force of trauma. She does have a chiropractor that she would be happy to see, the chiropractor does not want to see her until we rule out fractures. I am also want to add 5 days of prednisone. Adding some physical therapy as well for her neck. Return to see me in 3 weeks.  Julie Johnston does have a minimally displaced fracture of her right sixth rib, this is the likely cause of the severe pain, I think she is okay to do cervical manipulation but her chest should be avoided, she can also return in a nurse visit and just get a rib belt.  MDD (major depressive disorder), recurrent episode, moderate ([place]) Julie Johnston also has severe depression, she has been doing [place] treatments. She has had problems with SSRIs in the past, mostly anorgasmia and poor libido. She had not heard of Viibryd or Trintellix so we will start Trintellix at 10 mg daily. She has failed multiple other SSRIs, Zoloft, Lexapro, Celexa. She can continue her Wellbutrin, she was placed on Lamictal which has not improved her symptoms at all, she also does not endorse any symptoms of bipolar 1 or 2 disorder so we will discontinue Lamictal. She will keep close follow-up with myself, her PCP, and her psychiatrist.    ___________________________________________ Gwen Her.  Dianah Field, M.D., ABFM., CAQSM. Primary Care and [place] Instructor of [place] [employer] [facility]

## 2019-10-02 NOTE — Assessment & Plan Note (Addendum)
Several weeks ago, Julie Johnston was hit by a 1400 pound horse, she has had headaches, dizziness, chest wall pain, back pain, neck pain with radiation to the periscapular region. She did have some x-rays that were unrevealing, I really think she needs a head, cervical spine, chest CT to evaluate for any fractures. This is particular important due to the force of trauma. She does have a chiropractor that she would be happy to see, the chiropractor does not want to see her until we rule out fractures. I am also want to add 5 days of prednisone. Adding some physical therapy as well for her neck. Return to see me in 3 weeks.  Julie Johnston does have a minimally displaced fracture of her right sixth rib, this is the likely cause of the severe pain, I think she is okay to do cervical manipulation but her chest should be avoided, she can also return in a nurse visit and just get a rib belt.

## 2019-10-03 ENCOUNTER — Telehealth: Payer: Self-pay | Admitting: Physician Assistant

## 2019-10-03 NOTE — Telephone Encounter (Signed)
Received fax for PA on Trintellix sent through cover my meds waiting on determination. - CF

## 2019-10-05 ENCOUNTER — Other Ambulatory Visit: Payer: Self-pay | Admitting: Sports Medicine

## 2019-10-05 ENCOUNTER — Other Ambulatory Visit: Payer: Self-pay | Admitting: Physician Assistant

## 2019-10-05 DIAGNOSIS — T1490XA Injury, unspecified, initial encounter: Secondary | ICD-10-CM

## 2019-10-08 ENCOUNTER — Encounter: Payer: Self-pay | Admitting: Physician Assistant

## 2019-10-09 MED ORDER — LOSARTAN POTASSIUM 25 MG PO TABS: 25.0000 mg | ORAL_TABLET | Freq: Every day | ORAL | 3 refills | Status: DC

## 2019-10-09 NOTE — Telephone Encounter (Signed)
Denied because patient needed labs/appt for HTN. She was just seen for other issues. Looks like she had labs with psychiatry not long ago. Okay to fill?

## 2019-10-10 ENCOUNTER — Other Ambulatory Visit: Payer: Self-pay | Admitting: Sports Medicine

## 2019-10-10 DIAGNOSIS — G43801 Other migraine, not intractable, with status migrainosus: Secondary | ICD-10-CM

## 2019-10-10 NOTE — Telephone Encounter (Signed)
To PCP

## 2019-10-11 ENCOUNTER — Other Ambulatory Visit: Payer: Self-pay | Admitting: Physician Assistant

## 2019-10-11 ENCOUNTER — Ambulatory Visit (INDEPENDENT_AMBULATORY_CARE_PROVIDER_SITE_OTHER): Payer: BC Managed Care – PPO | Admitting: Rehabilitative and Restorative Service Providers"

## 2019-10-11 ENCOUNTER — Other Ambulatory Visit: Payer: Self-pay

## 2019-10-11 DIAGNOSIS — R293 Abnormal posture: Secondary | ICD-10-CM | POA: Diagnosis not present

## 2019-10-11 DIAGNOSIS — R29898 Other symptoms and signs involving the musculoskeletal system: Secondary | ICD-10-CM | POA: Diagnosis not present

## 2019-10-11 DIAGNOSIS — M542 Cervicalgia: Secondary | ICD-10-CM

## 2019-10-11 NOTE — Patient Instructions (Signed)
Access Code: [email or web address] URL: [email or web address] Date: 10/11/2019 Prepared by: Rudell Cobb  Program Notes Walking:  Comfortable pace, safe environment. Begin 5 minutes x 2-3 times per day.   Exercises Doorway Pec Stretch at 60 Elevation - 2 x daily - 7 x weekly - 1 sets - 2 reps - 30 seconds hold Standing Isometric Cervical Sidebending with Manual Resistance - 2 x daily - 7 x weekly - 1 sets - 5 reps - 5 seconds hold Isometric Cervical Extension at Wall with Ball - 2 x daily - 7 x weekly - 1 sets - 5 reps - 5 seconds hold

## 2019-10-11 NOTE — Therapy (Addendum)
[facility] [place], [place], [address] Phone: [phone]   Fax:  [phone]  Physical Therapy Evaluation  Patient Details  Name: Julie Johnston MRN: [id number] Date of Birth: 1970-07-05 Referring Provider (PT): Aundria Mems, MD   Encounter Date: 10/11/2019   PT End of Session - 10/11/19 1648    Visit Number 1    Number of Visits 12    Date for PT Re-Evaluation 11/22/19    Authorization Type BCBS- max 30 visits/year    Authorization - Number of Visits 30    PT Start Time 7619    PT Stop Time 1635    PT Time Calculation (min) 47 min           Past Medical History:  Diagnosis Date  . Depression   . Obesity (BMI 35.0-39.9 without comorbidity)     Past Surgical History:  Procedure Laterality Date  . BACK SURGERY    . CHOLECYSTECTOMY    . lumbar microdiskectomy      There were no vitals filed for this visit.    Subjective Assessment - 10/11/19 1552    Subjective The patient was injured in a horse accident around 09/09/19.  She reports neck pain, worsening psychiatric issues that she feels may be related to consussion.  She reports in addition to muscular issues, she was having migraine symptoms, vision changes, mood swings.    Pertinent History depression, anxiety, ADHD, PTSD, h/o neck injuries with chronic neck pain (uses chiropractic care)    Currently in Pain? Yes    Pain Score 3     Pain Location Neck   suboccipitals into thoracic spine   Pain Descriptors / Indicators Aching;Sore;Tightness    Pain Onset More than a month ago    Pain Frequency Intermittent    Aggravating Factors  worse after recent horse accident; movement    Pain Relieving Factors chiropractor typically reduces pain              [facility] PT Assessment - 10/11/19 1600      Assessment   Medical Diagnosis blunt trauma, focus on cervical DDD per referral    Referring Provider (PT) Aundria Mems, MD    Onset  Date/Surgical Date 09/12/19    Hand Dominance Right    Prior Therapy none      Precautions   Precautions None      Restrictions   Weight Bearing Restrictions No      Balance Screen   Has the patient fallen in the past 6 months No    Has the patient had a decrease in activity level because of a fear of falling?  Yes   due to current condition   Is the patient reluctant to leave their home because of a fear of falling?  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[place] [employer] patient notes she was at a reduced work load prior to this incident, then this has made work more challenging      Observation/Other Assessments   Focus on Therapeutic Outcomes (FOTO)  n/a      Posture/Postural Control   Posture/Postural Control Postural limitations    Postural Limitations Rounded Shoulders;Forward head      ROM / Strength  AROM / PROM / Strength AROM;Strength      AROM   Overall AROM Comments shoulders are WFLs    AROM Assessment Site Cervical    Cervical Flexion 30    Cervical Extension 45   hurts behind her eyes   Cervical - Right Side Bend 30    Cervical - Left Side Bend 30    Cervical - Right Rotation 60    Cervical - Left Rotation 50   neck feels stuck     Strength   Overall Strength Comments 4+/5 for bilat shoulder flexion and abduction; fatigues in c-spine with neck flexor endurance test      Palpation   Spinal mobility shortening through c-spine; hypomobility for lateral gliding mid c-spine and tightness CTJ    Palpation comment tenderness to suboccipital palpation                  Vestibular Assessment - 10/11/19 1623      Vestibular Assessment   General Observation spinning with return to sitting after neck assessment      Positional Testing   Sidelying Test  Sidelying Right;Sidelying Left      Sidelying Right   Sidelying Right Duration none    Sidelying Right Symptoms No nystagmus      Sidelying Left   Sidelying Left Duration none    Sidelying Left Symptoms No nystagmus          CONCUSSION SYMPTOM INVENTORY: *Rates 0 absent to 6 severe on visual analog scale  Headache: 2 Nausea:  0 Balance: 3 Fatigue: 5 Drowsiness: 4 "Fog": 6 Concentrating: 5 Remembering:  5 Sensitivity to light: 3 Sensitivity to noise: 0 Blurred vision:  1 Feeling slowed down: 3       Objective measurements completed on examination: See above findings.       [place] Adult PT Treatment/Exercise - 10/11/19 1600      Exercises   Exercises Neck      Neck Exercises: Standing   Neck Retraction 5 reps   isometric contraction x 5 second hold   Other Standing Exercises door frame stretch      Neck Exercises: Seated   Other Seated Exercise shoulder rolls posteriorly                  PT Education - 10/11/19 1643    Education Details HEP initiated    Person(s) Educated Patient    Methods Explanation;Demonstration;Handout    Comprehension Verbalized understanding;Returned demonstration               PT Long Term Goals - 10/11/19 1648      PT LONG TERM GOAL #1   Title The patient will be independent with HEP.    Time 6    Period Weeks    Target Date 11/22/19      PT LONG TERM GOAL #2   Title The patient will report no pain in c-spine at rest.    Time 6    Period Weeks    Target Date 11/22/19      PT LONG TERM GOAL #3   Title The patient will improve neck rotation to 75 degrees bilaterally.    Time 6    Period Weeks    Target Date 11/22/19      PT LONG TERM GOAL #4   Title The patient will report reduced frequency of migraines by 20% since evaluation.    Time 6    Period Weeks    Target Date 11/22/19  PT LONG TERM GOAL #5   Title The patient will be further assessed (if indicated) for vestibular symptoms, post  concussive symptoms, balance, and general mobility as needed.    Time 6    Period Weeks    Target Date 11/22/19                  Plan - 10/11/19 1650    Clinical Impression Statement The patient is a 49 yo female presenting to OP physical therapy s/p trauma on 09/12/19 when a horse ran into her.  She has a complex medical history of chronic neck pain, migraines, anxiety, depression.  She presents to OP PT today with dec'd neck AROM for rotation, postural changes, spinal hyomobility, tension headaches, tenderness to palpation suboccpital musculature.  She is presenting with functional limitations of dec'd ability to perform community mobility, dec'd tolerance to driving, difficulty with return to work and pain limiting function.    Personal Factors and Comorbidities Comorbidity 3+    Comorbidities depression, anxiety, ADHD, PTSD, h/o neck injuries with chronic neck pain (uses chiropractic care)    Examination-Activity Limitations Lift    Examination-Participation Restrictions Driving;Other;Community Activity   work tasks   [employer] Evolving/Moderate complexity    Clinical Decision Making Moderate    Rehab Potential Good    PT Frequency 2x / week    PT Duration 6 weeks    PT Treatment/Interventions Taping;Dry needling;Patient/family education;ADLs/Self Care Home Management;Traction;Moist Heat;Iontophoresis 4mg /ml Dexamethasone;Gait training;Stair training;Functional mobility training;Therapeutic activities;Therapeutic exercise;Balance training;Neuromuscular re-education;Manual techniques;Passive range of motion;Vestibular    PT Next Visit Plan Check HEP, progress home walking program, postural strengthening, deep neck stabilization, general conditioning.  *caution:  6th rib fx; may benefit from further assessment of concussion symptoms (will monitor progress wit therapy)    PT Home Exercise Plan Access Code: [email or web address]    Consulted and Agree with Plan of Care Patient            Patient will benefit from skilled therapeutic intervention in order to improve the following deficits and impairments:  Decreased range of motion, Decreased strength, Increased fascial restricitons, Decreased activity tolerance, Hypomobility, Impaired flexibility, Postural dysfunction  Visit Diagnosis: Cervicalgia - Plan: PT plan of care cert/re-cert  Abnormal posture - Plan: PT plan of care cert/re-cert  Other symptoms and signs involving the musculoskeletal system - Plan: PT plan of care cert/re-cert     Problem List Patient Active Problem List   Diagnosis Date Noted  . Right posterior sixth rib fracture 10/02/2019  . Post concussion syndrome 10/01/2019  . Acute thoracic back pain 10/01/2019  . PTSD (post-traumatic stress disorder) 10/01/2019  . Essential hypertension 06/04/2019  . Influenza-like illness 07/20/2018  . Equivocal stress echocardiogram 05/17/2018  . Frequent infections 05/08/2018  . Decreased thyroid stimulating hormone (TSH) level 05/08/2018  . Potential exposure to STD 12/16/2017  . Migraine 11/29/2017  . Dysuria 11/29/2017  . Drug-induced weight gain 02/23/2016  . Urine ketones 09/22/2015  . Fibromyalgia 09/22/2015  . Obesity 09/22/2015  . Obstructive sleep apnea 06/18/2015  . Pre-diabetes 05/14/2015  . Lyme disease 05/09/2015  . Insomnia 05/09/2015  . Palpitations 05/09/2015  . Chronic fatigue 05/09/2015  . No energy 05/09/2015  . Hyperlipidemia 05/09/2015  . Abnormal Pap smear of cervix 05/09/2015  . Attention deficit hyperactivity disorder (ADHD), predominantly inattentive type 05/09/2015  . MDD (major depressive disorder), recurrent episode, moderate ([employer]) 05/09/2015    Letta Cargile, PT 10/11/2019, 5:10 PM  [facility]  [place], [place], [address] Phone: [phone]   Fax:  [phone]  Name: Julie Johnston MRN: [id number] Date of Birth: 1970-07-31

## 2019-10-16 ENCOUNTER — Encounter: Payer: BC Managed Care – PPO | Admitting: Physical Therapy

## 2019-10-18 ENCOUNTER — Encounter: Payer: BC Managed Care – PPO | Admitting: Physical Therapy

## 2019-10-23 ENCOUNTER — Encounter: Payer: Self-pay | Admitting: Physician Assistant

## 2019-10-23 ENCOUNTER — Encounter: Payer: Self-pay | Admitting: Sports Medicine

## 2019-10-23 ENCOUNTER — Ambulatory Visit (INDEPENDENT_AMBULATORY_CARE_PROVIDER_SITE_OTHER): Payer: BC Managed Care – PPO | Admitting: Sports Medicine

## 2019-10-23 ENCOUNTER — Telehealth: Payer: Self-pay | Admitting: Rehabilitative and Restorative Service Providers"

## 2019-10-23 ENCOUNTER — Other Ambulatory Visit: Payer: Self-pay

## 2019-10-23 ENCOUNTER — Encounter: Payer: BC Managed Care – PPO | Admitting: Rehabilitative and Restorative Service Providers"

## 2019-10-23 DIAGNOSIS — S2231XD Fracture of one rib, right side, subsequent encounter for fracture with routine healing: Secondary | ICD-10-CM | POA: Diagnosis not present

## 2019-10-23 DIAGNOSIS — F331 Major depressive disorder, recurrent, moderate: Secondary | ICD-10-CM | POA: Diagnosis not present

## 2019-10-23 NOTE — Assessment & Plan Note (Signed)
Julie Johnston was hit by a 1400 pound horse a month or so ago, she had some chest wall pain, back pain, initially x-rays were unrevealing, we added CTs of her head, cervical spine, chest, she did have a relatively nondisplaced right posterior sixth rib fracture, this is improving considerably.

## 2019-10-23 NOTE — Assessment & Plan Note (Addendum)
Julie Johnston has severe depression, has been undergoing TMS treatments, she has not tolerated SSRIs in the past due to anorgasmia and poor libido. We started Trintellix at 10 mg daily, unfortunately she never started it as she was told by her psychiatrist that the combination of Trintellix plus Wellbutrin would cause serotonin syndrome. She has continued with her Lamictal as well. I plan to discuss this with her psychiatrist as I do think she is profoundly depressed at this point, she does tell me that she has alienated several friendships, she has also missed only appointments with psychiatrist that she has trouble getting in with her. She is convinced that her symptoms are related to the concussion though I think she really does not have any further concussive symptoms and her problems are more related to the underlying mood disorder. We had a long discussion about this, I am going to add some behavioral therapy.  Of note she wanted me to include that she had new symptoms since the horse incident of rage/impulsivity, violent mood swings, tinnitus, unusual disruptions of memory and focus, as well as sleep disturbances.  I think she would like to associate this with a concussion however my suspicion is these are all worsening symptoms of her underlying mood disorder.

## 2019-10-23 NOTE — Telephone Encounter (Signed)
The patient left voicemail today regarding oversleeping.  PT called patient to check on status.  She notes she is having continued issues and plans to discuss symptoms with her physician today (describes as "head problems"). PT reviewed next appt this Friday @ 11am. Rudell Cobb, PT

## 2019-10-23 NOTE — Progress Notes (Addendum)
    Procedures performed today:    None.  Independent interpretation of notes and tests performed by another provider:   None.  Brief History, Exam, Impression, and Recommendations:    MDD (major depressive disorder), recurrent episode, moderate (HCC) Julie Johnston has severe depression, has been undergoing TMS treatments, she has not tolerated SSRIs in the past due to anorgasmia and poor libido. We started Trintellix at 10 mg daily, unfortunately she never started it as she was told by her psychiatrist that the combination of Trintellix plus Wellbutrin would cause serotonin syndrome. She has continued with her Lamictal as well. I plan to discuss this with her psychiatrist as I do think she is profoundly depressed at this point, she does tell me that she has alienated several friendships, she has also missed only appointments with psychiatrist that she has trouble getting in with her. She is convinced that her symptoms are related to the concussion though I think she really does not have any further concussive symptoms and her problems are more related to the underlying mood disorder. We had a long discussion about this, I am going to add some behavioral therapy.  Of note she wanted me to include that she had new symptoms since the horse incident of rage/impulsivity, violent mood swings, tinnitus, unusual disruptions of memory and focus, as well as sleep disturbances.  I think she would like to associate this with a concussion however my suspicion is these are all worsening symptoms of her underlying mood disorder.  Right posterior sixth rib fracture Julie Johnston was hit by a 1400 pound horse a month or so ago, she had some chest wall pain, back pain, initially x-rays were unrevealing, we added CTs of her head, cervical spine, chest, she did have a relatively nondisplaced right posterior sixth rib fracture, this is improving considerably.  I spent 30 minutes of total time managing this patient today, this  includes chart review, face to face, and non-face to face time.   ___________________________________________ Gwen Her. Dianah Field, M.D., ABFM., CAQSM. Primary Care and [place] Instructor of [employer] [facility]

## 2019-10-24 ENCOUNTER — Other Ambulatory Visit: Payer: Self-pay | Admitting: Physician Assistant

## 2019-10-24 DIAGNOSIS — I10 Essential (primary) hypertension: Secondary | ICD-10-CM

## 2019-10-26 ENCOUNTER — Other Ambulatory Visit: Payer: Self-pay

## 2019-10-26 ENCOUNTER — Ambulatory Visit (INDEPENDENT_AMBULATORY_CARE_PROVIDER_SITE_OTHER): Payer: BC Managed Care – PPO | Admitting: Rehabilitative and Restorative Service Providers"

## 2019-10-26 ENCOUNTER — Other Ambulatory Visit: Payer: Self-pay | Admitting: Physician Assistant

## 2019-10-26 DIAGNOSIS — S060X0A Concussion without loss of consciousness, initial encounter: Secondary | ICD-10-CM | POA: Insufficient documentation

## 2019-10-26 DIAGNOSIS — R413 Other amnesia: Secondary | ICD-10-CM

## 2019-10-26 DIAGNOSIS — R4189 Other symptoms and signs involving cognitive functions and awareness: Secondary | ICD-10-CM | POA: Insufficient documentation

## 2019-10-26 DIAGNOSIS — F331 Major depressive disorder, recurrent, moderate: Secondary | ICD-10-CM

## 2019-10-26 DIAGNOSIS — M542 Cervicalgia: Secondary | ICD-10-CM

## 2019-10-26 DIAGNOSIS — R29898 Other symptoms and signs involving the musculoskeletal system: Secondary | ICD-10-CM | POA: Diagnosis not present

## 2019-10-26 DIAGNOSIS — R293 Abnormal posture: Secondary | ICD-10-CM

## 2019-10-26 DIAGNOSIS — F4323 Adjustment disorder with mixed anxiety and depressed mood: Secondary | ICD-10-CM | POA: Insufficient documentation

## 2019-10-26 DIAGNOSIS — F900 Attention-deficit hyperactivity disorder, predominantly inattentive type: Secondary | ICD-10-CM

## 2019-10-26 DIAGNOSIS — F4310 Post-traumatic stress disorder, unspecified: Secondary | ICD-10-CM

## 2019-10-26 NOTE — Patient Instructions (Signed)
Access Code: [email or web address] URL: [email or web address] Date: 10/26/2019 Prepared by: Rudell Cobb  Program Notes Walking:  Comfortable pace, safe environment. Begin 5 minutes x 2-3 times per day.   Exercises Doorway Pec Stretch at 60 Elevation - 2 x daily - 7 x weekly - 1 sets - 2 reps - 30 seconds hold Standing Isometric Cervical Sidebending with Manual Resistance - 2 x daily - 7 x weekly - 1 sets - 5 reps - 5 seconds hold Isometric Cervical Extension at Wall with Ball - 2 x daily - 7 x weekly - 1 sets - 5 reps - 5 seconds hold Supine Cervical Rotation AROM on Pillow - 2 x daily - 7 x weekly - 1 sets - 10 reps Supine Thoracic Mobilization Towel Roll Vertical with Arm Stretch - 2 x daily - 7 x weekly - 1 sets - 10 reps

## 2019-10-26 NOTE — Therapy (Signed)
[facility] [place], [place], [address] Phone: [phone]   Fax:  [phone]  Physical Therapy Treatment  Patient Details  Name: Julie Johnston MRN: [id number] Date of Birth: 05-26-1970 Referring Provider (PT): Aundria Mems, MD   Encounter Date: 10/26/2019   PT End of Session - 10/26/19 1255    Visit Number 2    Number of Visits 12    Date for PT Re-Evaluation 11/22/19    Authorization Type BCBS- max 30 visits/year    Authorization - Number of Visits 30    PT Start Time 1115    PT Stop Time 1145    PT Time Calculation (min) 30 min           Past Medical History:  Diagnosis Date  . Depression   . Obesity (BMI 35.0-39.9 without comorbidity)     Past Surgical History:  Procedure Laterality Date  . BACK SURGERY    . CHOLECYSTECTOMY    . lumbar microdiskectomy      There were no vitals filed for this visit.   Subjective Assessment - 10/26/19 1117    Subjective The patient reports pain in upper trap into suboccipital region.  She feels like her HA have progressed from migraine to normal headache (she is no longer having to lie down with something over her eyes).  She is still getting daily HA.  She has not been able to perform HEP regularly.    Pertinent History depression, anxiety, ADHD, PTSD, h/o neck injuries with chronic neck pain (uses chiropractic care)    Patient Stated Goals reduce pain    Currently in Pain? Yes    Pain Score 3     Pain Location Neck    Pain Orientation Right    Pain Descriptors / Indicators Aching    Pain Type Chronic pain    Pain Onset More than a month ago    Pain Frequency Intermittent    Aggravating Factors  movement    Pain Relieving Factors chiropractor typically reduces pain                             [facility] Adult PT Treatment/Exercise - 10/26/19 1120      Self-Care   Self-Care Other Self-Care Comments    Other Self-Care Comments   discussed home walking program to add to HEP      Exercises   Exercises Neck      Neck Exercises: Machines for Strengthening   UBE (Upper Arm Bike) Level 1:  2.5 minutes forward/1 minute backwards      Neck Exercises: Standing   Neck Retraction 5 reps    Other Standing Exercises door frame stretch      Neck Exercises: Seated   Cervical Isometrics Right lateral flexion;Left lateral flexion;5 secs;5 reps      Neck Exercises: Supine   Cervical Rotation Right;Left;10 reps    X to V 10 reps    Other Supine Exercise supine L position x 12 reps, horizontal abduction supine x 12 reps    Other Supine Exercise supine T stretch adding wrist flexion/extension for nerve glide x 2 minutes      Neck Exercises: Sidelying   Other Sidelying Exercise scapular mobilization passively with shoulder depression for stretch      Manual Therapy   Manual Therapy Soft tissue mobilization    Manual therapy comments to reduce pain and tightness    Soft tissue mobilization  ischemic compression TrPs in upper trapezius, scalenes, and suboccipitals                       PT Long Term Goals - 10/11/19 1648      PT LONG TERM GOAL #1   Title The patient will be independent with HEP.    Time 6    Period Weeks    Target Date 11/22/19      PT LONG TERM GOAL #2   Title The patient will report no pain in c-spine at rest.    Time 6    Period Weeks    Target Date 11/22/19      PT LONG TERM GOAL #3   Title The patient will improve neck rotation to 75 degrees bilaterally.    Time 6    Period Weeks    Target Date 11/22/19      PT LONG TERM GOAL #4   Title The patient will report reduced frequency of migraines by 20% since evaluation.    Time 6    Period Weeks    Target Date 11/22/19      PT LONG TERM GOAL #5   Title The patient will be further assessed (if indicated) for vestibular symptoms, post concussive symptoms, balance, and general mobility as needed.    Time 6    Period Weeks     Target Date 11/22/19                 Plan - 10/26/19 1149    Clinical Impression Statement The patient has had increased mood symptoms she feels are exacerbated post concussion.  This has limited her participation in therapy at home and attendance.  She tolerated exercises well today with some progression in HEP.  Plan to continue to patient tolerance.    PT Treatment/Interventions Taping;Dry needling;Patient/family education;ADLs/Self Care Home Management;Traction;Moist Heat;Iontophoresis 4mg /ml Dexamethasone;Gait training;Stair training;Functional mobility training;Therapeutic activities;Therapeutic exercise;Balance training;Neuromuscular re-education;Manual techniques;Passive range of motion;Vestibular    PT Next Visit Plan Check HEP, progress home walking program, postural strengthening, deep neck stabilization, general conditioning.  *caution:  6th rib fx; may benefit from further assessment of concussion symptoms (will monitor progress wit therapy)    PT Home Exercise Plan Access Code: [email or web address]    Consulted and Agree with Plan of Care Patient           Patient will benefit from skilled therapeutic intervention in order to improve the following deficits and impairments:     Visit Diagnosis: Cervicalgia  Abnormal posture  Other symptoms and signs involving the musculoskeletal system     Problem List Patient Active Problem List   Diagnosis Date Noted  . Right posterior sixth rib fracture 10/02/2019  . Acute thoracic back pain 10/01/2019  . PTSD (post-traumatic stress disorder) 10/01/2019  . Essential hypertension 06/04/2019  . Influenza-like illness 07/20/2018  . Equivocal stress echocardiogram 05/17/2018  . Frequent infections 05/08/2018  . Decreased thyroid stimulating hormone (TSH) level 05/08/2018  . Potential exposure to STD 12/16/2017  . Migraine 11/29/2017  . Dysuria 11/29/2017  . Drug-induced weight gain 02/23/2016  . Urine ketones 09/22/2015  .  Fibromyalgia 09/22/2015  . Obesity 09/22/2015  . Obstructive sleep apnea 06/18/2015  . Pre-diabetes 05/14/2015  . Lyme disease 05/09/2015  . Insomnia 05/09/2015  . Palpitations 05/09/2015  . Chronic fatigue 05/09/2015  . No energy 05/09/2015  . Hyperlipidemia 05/09/2015  . Abnormal Pap smear of cervix 05/09/2015  . Attention deficit hyperactivity disorder (ADHD), predominantly inattentive type  05/09/2015  . MDD (major depressive disorder), recurrent episode, moderate ([place]) 05/09/2015    Suzie Vandam, PT 10/26/2019, 2:37 PM  [facility] [place], [place], [address] Phone: [phone]   Fax:  [phone]  Name: Julie Johnston MRN: [id number] Date of Birth: [birth year]

## 2019-10-30 ENCOUNTER — Encounter: Payer: BC Managed Care – PPO | Admitting: Rehabilitative and Restorative Service Providers"

## 2019-11-01 ENCOUNTER — Ambulatory Visit: Payer: BC Managed Care – PPO | Admitting: Psychology

## 2019-11-01 ENCOUNTER — Ambulatory Visit (INDEPENDENT_AMBULATORY_CARE_PROVIDER_SITE_OTHER): Payer: BC Managed Care – PPO | Admitting: Rehabilitative and Restorative Service Providers"

## 2019-11-01 ENCOUNTER — Other Ambulatory Visit: Payer: Self-pay

## 2019-11-01 DIAGNOSIS — M542 Cervicalgia: Secondary | ICD-10-CM | POA: Diagnosis not present

## 2019-11-01 DIAGNOSIS — R293 Abnormal posture: Secondary | ICD-10-CM | POA: Diagnosis not present

## 2019-11-01 DIAGNOSIS — R29898 Other symptoms and signs involving the musculoskeletal system: Secondary | ICD-10-CM | POA: Diagnosis not present

## 2019-11-01 NOTE — Therapy (Signed)
[facility], [place], [address] Phone: [phone]   Fax:  [phone]  Physical Therapy Treatment and Discharge Summary  Patient Details  Name: Julie Johnston MRN: [id number] Date of Birth: Jul 04, 1970 Referring Provider (PT): Aundria Mems, MD   Encounter Date: 11/01/2019   PT End of Session - 11/01/19 1125    Visit Number 3    Number of Visits 12    Date for PT Re-Evaluation 11/22/19    Authorization Type BCBS- max 30 visits/year    Authorization - Visit Number 3    Authorization - Number of Visits 30    PT Start Time 1115    PT Stop Time 1145    PT Time Calculation (min) 30 min    Activity Tolerance Patient tolerated treatment well    Behavior During Therapy [facility] for tasks assessed/performed           Past Medical History:  Diagnosis Date  . Depression   . Obesity (BMI 35.0-39.9 without comorbidity)     Past Surgical History:  Procedure Laterality Date  . BACK SURGERY    . CHOLECYSTECTOMY    . lumbar microdiskectomy      There were no vitals filed for this visit.   Subjective Assessment - 11/01/19 1115    Subjective The patient reports she saw concussion specialist at [facility] who is going to refer her a vestibular specialist because of tinnitus.  She discussed that she is not able to be compliant with the HEP right now and is unsure why.  She discussed difficulties with sleep, cognition, tinnitus, and mood since the concussion.    Pertinent History depression, anxiety, ADHD, PTSD, h/o neck injuries with chronic neck pain (uses chiropractic care)    Patient Stated Goals reduce pain    Currently in Pain? Yes    Pain Score 4     Pain Location Neck    Pain Orientation Right    Pain Descriptors / Indicators Aching;Tightness    Pain Type Chronic pain    Pain Onset More than a month ago    Pain Frequency Intermittent    Aggravating Factors  movement    Pain Relieving  Factors chiropractor              [facility] PT Assessment - 11/01/19 1129      Assessment   Medical Diagnosis blunt trauma, focus on cervical DDD per referral    Referring Provider (PT) Aundria Mems, MD    Onset Date/Surgical Date 09/12/19      AROM   Cervical - Right Rotation 65    Cervical - Left Rotation 55                         [facility] Adult PT Treatment/Exercise - 11/01/19 1232      Self-Care   Self-Care Other Self-Care Comments    Other Self-Care Comments  PT and patient talked about current plan of care.  She reports she is unable to follow through with home activities and it is hard getting to visits.  She notes many medical visits/week between counselors, physicians, chriopractor, and PT.  PT and patient discussed that if she feels most limited in mood/cognitive changes and is having a hard time initiating compliance with program and attending PT, we should hold off on further visits. PT encouraged her to participate in home program after potentially getting some ideas from counselor on creating a sleep schedule, daily  schedule and using reminders for regular tasks.    We also talked about gentle walking as a way to increase movement during the day.  Patient inquired about aquatic therapy and we discussed that she is having a hard time completing home tasks + walking and the barrier to the pool at this point would be the effort to get there and another appt to attend each week.  Instead, I recommended focusing on HEP + walking and discussing strategies with her counselor to help with follow through.                    PT Education - 11/01/19 1230    Education Details Recommended current HEP as she is able to work with counselor.    Person(s) Educated Patient    Methods Explanation    Comprehension Verbalized understanding               PT Long Term Goals - 11/01/19 1125      PT LONG TERM GOAL #1   Title The patient will be independent with  HEP.    Baseline The patient has HEP, but is not able to initiate to complete program at this time.    Time 6    Period Weeks    Status Not Met      PT LONG TERM GOAL #2   Title The patient will report no pain in c-spine at rest.    Time 6    Period Weeks    Status Not Met      PT LONG TERM GOAL #3   Title The patient will improve neck rotation to 75 degrees bilaterally.    Time 6    Period Weeks    Status Partially Met      PT LONG TERM GOAL #4   Title The patient will report reduced frequency of migraines by 20% since evaluation.    Baseline She is not having to take migraine medications, but is taking motrin.  She is no longer having to wear sunglasses, and her nausea associated with HA is resolved. She denies dizziness.    Time 6    Period Weeks    Status Partially Met      PT LONG TERM GOAL #5   Title The patient will be further assessed (if indicated) for vestibular symptoms, post concussive symptoms, balance, and general mobility as needed.    Baseline Dr. Everardo Beals is referring patient to vestibular specialist due to tinnitus.    Time 6    Period Weeks    Status Deferred                 Plan - 11/01/19 1241    Clinical Impression Statement The patient has attended 3/6 scheduled PT visits.  She reports challenges of many appointments and dec'd ability to carryover exercises to home at this time.  She feels hindered by cognitive and mood changes limiting her participation in PT.  Therefore, PT recommended we hold care at this time and she can return when she feels she would be able to participate.    PT Treatment/Interventions Taping;Dry needling;Patient/family education;ADLs/Self Care Home Management;Traction;Moist Heat;Iontophoresis 40m/ml Dexamethasone;Gait training;Stair training;Functional mobility training;Therapeutic activities;Therapeutic exercise;Balance training;Neuromuscular re-education;Manual techniques;Passive range of motion;Vestibular    PT Next Visit  Plan discharge at this time    PT Home Exercise Plan Access Code: [email or web address]   Consulted and Agree with Plan of Care Patient  Patient will benefit from skilled therapeutic intervention in order to improve the following deficits and impairments:  Decreased range of motion, Decreased strength, Increased fascial restricitons, Decreased activity tolerance, Hypomobility, Impaired flexibility, Postural dysfunction  Visit Diagnosis: Cervicalgia  Abnormal posture  Other symptoms and signs involving the musculoskeletal system     Problem List Patient Active Problem List   Diagnosis Date Noted  . Right posterior sixth rib fracture 10/02/2019  . Acute thoracic back pain 10/01/2019  . PTSD (post-traumatic stress disorder) 10/01/2019  . Essential hypertension 06/04/2019  . Influenza-like illness 07/20/2018  . Equivocal stress echocardiogram 05/17/2018  . Frequent infections 05/08/2018  . Decreased thyroid stimulating hormone (TSH) level 05/08/2018  . Potential exposure to STD 12/16/2017  . Migraine 11/29/2017  . Dysuria 11/29/2017  . Drug-induced weight gain 02/23/2016  . Urine ketones 09/22/2015  . Fibromyalgia 09/22/2015  . Obesity 09/22/2015  . Obstructive sleep apnea 06/18/2015  . Pre-diabetes 05/14/2015  . Lyme disease 05/09/2015  . Insomnia 05/09/2015  . Palpitations 05/09/2015  . Chronic fatigue 05/09/2015  . No energy 05/09/2015  . Hyperlipidemia 05/09/2015  . Abnormal Pap smear of cervix 05/09/2015  . Attention deficit hyperactivity disorder (ADHD), predominantly inattentive type 05/09/2015  . MDD (major depressive disorder), recurrent episode, moderate ([place]) 05/09/2015    Stillman Buenger, PT 11/01/2019, 12:44 PM  [facility] [place], [place], [address] Phone: [phone]   Fax:  [phone]  Name: Nykiah Ma MRN: [id number] Date of Birth: 1970-09-26

## 2019-11-06 ENCOUNTER — Telehealth (INDEPENDENT_AMBULATORY_CARE_PROVIDER_SITE_OTHER): Payer: BC Managed Care – PPO | Admitting: Physician Assistant

## 2019-11-06 ENCOUNTER — Telehealth: Payer: Self-pay | Admitting: Physician Assistant

## 2019-11-06 VITALS — Ht 65.0 in | Wt 285.0 lb

## 2019-11-06 DIAGNOSIS — F331 Major depressive disorder, recurrent, moderate: Secondary | ICD-10-CM | POA: Diagnosis not present

## 2019-11-06 DIAGNOSIS — Z87820 Personal history of traumatic brain injury: Secondary | ICD-10-CM | POA: Diagnosis not present

## 2019-11-06 DIAGNOSIS — G4700 Insomnia, unspecified: Secondary | ICD-10-CM

## 2019-11-06 DIAGNOSIS — R41840 Attention and concentration deficit: Secondary | ICD-10-CM

## 2019-11-06 DIAGNOSIS — R413 Other amnesia: Secondary | ICD-10-CM

## 2019-11-06 DIAGNOSIS — F4310 Post-traumatic stress disorder, unspecified: Secondary | ICD-10-CM

## 2019-11-06 DIAGNOSIS — R4586 Emotional lability: Secondary | ICD-10-CM

## 2019-11-06 DIAGNOSIS — F419 Anxiety disorder, unspecified: Secondary | ICD-10-CM

## 2019-11-06 NOTE — Telephone Encounter (Signed)
Patient would like to be seen today for a 5 min visit, states its important to only see PCP. Would like to know if she can be worked in.

## 2019-11-06 NOTE — Telephone Encounter (Signed)
Appointment has been made. No further questions at this time.  

## 2019-11-06 NOTE — Progress Notes (Signed)
Wants to discuss issues with work PHQ9-GAD7 completed.

## 2019-11-06 NOTE — Progress Notes (Signed)
Patient ID: Julie Johnston, female   DOB: [birth year], 49 y.o.   MRN: [id number] .[name] KitchenVirtual Visit via Telephone Note  I connected with Julie Johnston on 11/06/2019 at  4:00 PM EDT by telephone and verified that I am speaking with the correct person using two identifiers.  Location: Patient: home Provider: clinic   I discussed the limitations, risks, security and privacy concerns of performing an evaluation and management service by telephone and the availability of in person appointments. I also discussed with the patient that there may be a patient responsible charge related to this service. The patient expressed understanding and agreed to proceed.   History of Present Illness: Pt is a 49 yo female with history of concussion and traumatic horse encounter with ongoing symptoms who presents to the clinic to follow up. She saw sports medicine at [facility] and they wrote her out of work until [facility] appt. She just "doesn't know what to do". She wanted to talk with me today to help her with a plan. She wants to keep her job but does not think she can do the job. She crys all the time, she is very irritable, she has no motavation, she can't keep track of times, memory decreased, can not concentrate. No med changes have been made. Denies SI/HC.   [name] Kitchen. Active Ambulatory Problems    Diagnosis Date Noted  . Lyme disease 05/09/2015  . Insomnia 05/09/2015  . Palpitations 05/09/2015  . Chronic fatigue 05/09/2015  . No energy 05/09/2015  . Hyperlipidemia 05/09/2015  . Abnormal Pap smear of cervix 05/09/2015  . Attention deficit hyperactivity disorder (ADHD), predominantly inattentive type 05/09/2015  . MDD (major depressive disorder), recurrent episode, moderate ([place]) 05/09/2015  . Pre-diabetes 05/14/2015  . Obstructive sleep apnea 06/18/2015  . Urine ketones 09/22/2015  . Fibromyalgia 09/22/2015  . Obesity 09/22/2015  . Drug-induced weight gain 02/23/2016  . Migraine 11/29/2017  . Dysuria 11/29/2017  .  Potential exposure to STD 12/16/2017  . Frequent infections 05/08/2018  . Decreased thyroid stimulating hormone (TSH) level 05/08/2018  . Equivocal stress echocardiogram 05/17/2018  . Influenza-like illness 07/20/2018  . Essential hypertension 06/04/2019  . Acute thoracic back pain 10/01/2019  . PTSD (post-traumatic stress disorder) 10/01/2019  . Right posterior sixth rib fracture 10/02/2019  . Anxiety 11/07/2019  . Mood changes 11/07/2019  . Memory changes 11/07/2019  . Concentration deficit 11/07/2019  . History of concussion 11/07/2019   Resolved Ambulatory Problems    Diagnosis Date Noted  . Influenza B 12/16/2017  . Post concussion syndrome 10/01/2019   Past Medical History:  Diagnosis Date  . Depression   . Obesity (BMI 35.0-39.9 without comorbidity)    Reviewed med, allergy, problem list.     Observations/Objective: Very tearful. Eyes appear swollen from crying.  She keeps eyes closed through most of encounter.   .. Today's Vitals   11/06/19 1511  Weight: 285 lb (129.3 kg)  Height: 5\' 5"  (1.651 m)   Body mass index is 47.43 kg/m.   .. Depression screen [facility] 2/9 11/06/2019 09/28/2019 06/01/2019 03/21/2019  Decreased Interest 3 3 3 2   Down, Depressed, Hopeless 3 3 3 2   PHQ - 2 Score 6 6 6 4   Altered sleeping 3 2 3  0  Tired, decreased energy 3 3 3 3   Change in appetite 3 3 3  0  Feeling bad or failure about yourself  3 3 2 1   Trouble concentrating 3 3 3 3   Moving slowly or fidgety/restless 0 0 0 0  Suicidal thoughts  0 1 1 0  PHQ-9 Score 21 21 21 11   Difficult doing work/chores Extremely dIfficult Extremely dIfficult Extremely dIfficult Extremely dIfficult   .[name] Kitchen GAD 7 : Generalized Anxiety Score 11/06/2019 09/28/2019 06/01/2019 03/21/2019  Nervous, Anxious, on Edge 3 3 3 2   Control/stop worrying 3 3 3 2   Worry too much - different things 3 3 3 1   Trouble relaxing 3 3 3 1   Restless 0 1 1 0  Easily annoyed or irritable 3 2 0 1  Afraid - awful might happen 3 2 3  2   Total GAD 7 Score 18 17 16 9   Anxiety Difficulty Extremely difficult Extremely difficult Extremely difficult Very difficult     Assessment and Plan: [name] KitchenMarland KitchenHadasah was seen today for stress.  Diagnoses and all orders for this visit:  History of concussion  Concentration deficit  Memory changes  Mood changes  MDD (major depressive disorder), recurrent episode, moderate (HCC)  PTSD (post-traumatic stress disorder)  Insomnia, unspecified type  Anxiety   Pt saw [facility] sports medicine who evaluated her for concussion and post concussion symptoms. He wrote her out until 11/16/2019. She will then see [facility] on 8/27 and neuropysch on 9/7.   After traumatic injury with horse her mental health as plummeted. I agree she cannot work at this time. Keeping her job is a Astron[employer] for her but I am wondering if keeping her so mentally obsessed that she cannot get better. I suggested giving up the job for right now and focus on her getting better. She has had a long history of not being able to keep a job due to flares of depression, anxiety, stress. She is working with [facility] on this. She is trying many different modalities and treatment plans.  She does report to NOT be suicidal. Discussed [facility] facility if that changes.    Follow Up Instructions:    I discussed the assessment and treatment plan with the patient. The patient was provided an opportunity to ask questions and all were answered. The patient agreed with the plan and demonstrated an understanding of the instructions.   The patient was advised to call back or seek an in-person evaluation if the symptoms worsen or if the condition fails to improve as anticipated.  I provided 25 minutes of non-face-to-face time during this encounter.   Iran Planas, PA-C

## 2019-11-06 NOTE — Telephone Encounter (Signed)
Ok to unblock the 4:00

## 2019-11-07 ENCOUNTER — Encounter: Payer: Self-pay | Admitting: Physician Assistant

## 2019-11-07 DIAGNOSIS — F411 Generalized anxiety disorder: Secondary | ICD-10-CM | POA: Insufficient documentation

## 2019-11-07 DIAGNOSIS — F419 Anxiety disorder, unspecified: Secondary | ICD-10-CM | POA: Insufficient documentation

## 2019-11-07 DIAGNOSIS — Z87820 Personal history of traumatic brain injury: Secondary | ICD-10-CM | POA: Insufficient documentation

## 2019-11-07 DIAGNOSIS — R413 Other amnesia: Secondary | ICD-10-CM | POA: Insufficient documentation

## 2019-11-07 DIAGNOSIS — R41840 Attention and concentration deficit: Secondary | ICD-10-CM | POA: Insufficient documentation

## 2019-11-07 DIAGNOSIS — R4586 Emotional lability: Secondary | ICD-10-CM | POA: Insufficient documentation

## 2019-11-11 ENCOUNTER — Other Ambulatory Visit: Payer: Self-pay | Admitting: Sports Medicine

## 2019-11-11 DIAGNOSIS — J111 Influenza due to unidentified influenza virus with other respiratory manifestations: Secondary | ICD-10-CM

## 2019-11-14 ENCOUNTER — Encounter: Payer: Self-pay | Admitting: Physician Assistant

## 2019-11-23 ENCOUNTER — Other Ambulatory Visit: Payer: Self-pay | Admitting: Physician Assistant

## 2019-11-23 DIAGNOSIS — I10 Essential (primary) hypertension: Secondary | ICD-10-CM

## 2019-11-27 ENCOUNTER — Encounter: Payer: Self-pay | Admitting: Psychology

## 2019-11-27 ENCOUNTER — Ambulatory Visit (INDEPENDENT_AMBULATORY_CARE_PROVIDER_SITE_OTHER): Payer: BC Managed Care – PPO | Admitting: Psychology

## 2019-11-27 ENCOUNTER — Other Ambulatory Visit: Payer: Self-pay

## 2019-11-27 ENCOUNTER — Ambulatory Visit: Payer: BC Managed Care – PPO | Admitting: Psychology

## 2019-11-27 DIAGNOSIS — F4310 Post-traumatic stress disorder, unspecified: Secondary | ICD-10-CM

## 2019-11-27 DIAGNOSIS — F332 Major depressive disorder, recurrent severe without psychotic features: Secondary | ICD-10-CM

## 2019-11-27 DIAGNOSIS — F411 Generalized anxiety disorder: Secondary | ICD-10-CM | POA: Diagnosis not present

## 2019-11-27 DIAGNOSIS — S069X0S Unspecified intracranial injury without loss of consciousness, sequela: Secondary | ICD-10-CM

## 2019-11-27 DIAGNOSIS — R4189 Other symptoms and signs involving cognitive functions and awareness: Secondary | ICD-10-CM

## 2019-11-27 NOTE — Progress Notes (Addendum)
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 upon informal observation and no abnormal movements (e.g., tremors) were noted. Her affect was generally relaxed and positive, but did range appropriately given the subject being discussed during the clinical interview or the task at hand during testing procedures. Spontaneous speech was fluent and word finding difficulties were not observed during the clinical interview. Thought processes were coherent, organized, and normal in content. Insight into her cognitive difficulties appeared adequate. During testing, sustained attention was appropriate. Task engagement was adequate and she persisted when challenged. Overall, Ms. Wiens was cooperative with the clinical interview and subsequent testing procedures.   Adequacy of Effort: The validity of neuropsychological testing is limited by the extent to which the individual being tested may be assumed to have exerted adequate effort during testing. Ms. Durfee expressed her intention to perform to the best of her abilities and exhibited adequate task engagement and persistence. Scores across stand-alone and embedded performance validity measures were within expectation. As such, the results of the current  evaluation are believed to be a valid representation of Ms. Inda' current cognitive functioning.  Test Results: Ms. Mcavoy was fully oriented at the time of the current evaluation.  Intellectual abilities based upon educational and vocational attainment were estimated to be in the average range. Premorbid abilities were estimated to be within the well above average range based upon a single-word reading test.   Processing speed was average. Basic attention was below average. More complex attention (e.g., working memory) was average. Executive functioning was variable, ranging from the well below average to above average normative ranges.  Assessed receptive language abilities were average. Likewise, Ms. Walstad did not exhibit any difficulties comprehending task instructions and answered all questions asked of her appropriately. Assessed expressive language (e.g., verbal fluency and confrontation naming) was average.     Assessed visuospatial/visuoconstructional abilities were variable, ranging from the below average to above average normative ranges.    Learning (i.e., encoding) of novel verbal and visual information was variable, ranging from the well below average to average normative ranges. Spontaneous delayed recall (i.e., retrieval) of previously learned information was also variable, ranging from the exceptionally low to average normative ranges. Retention rates were 87% across a story learning task, 100% across a list learning task, and 71% across a shape learning task. Performance across recognition tasks was average, suggesting evidence for information consolidation.   Results of emotional screening instruments suggested that recent symptoms of generalized anxiety were in the severe range, while symptoms of depression were also within the severe range. Responses across a trauma symptom questionnaire was elevated, suggesting current symptoms consistent with individuals who have an active  diagnosis of PTSD. A screening instrument assessing recent sleep quality suggested the presence of moderate sleep dysfunction.  Tables of Scores:   Note: This summary of test  scores accompanies the interpretive report and should not be considered in isolation without reference to the appropriate sections in the text. Descriptors are based on appropriate normative data and may be adjusted based on clinical judgment. The terms "impaired" and "within normal limits (WNL)" are used when a more specific level of functioning cannot be determined.       Effort Testing:   DESCRIPTOR       ACS Word Choice: --- --- Within Expectation  NAB EVI: --- --- Within Expectation  D-KEFS Color Word Effort Index: --- --- Within Expectation       Orientation:      Raw Score Percentile   NAB Orientation, Form 1 29/29 --- ---       Cognitive Screening:           Raw Score Percentile   SLUMS: 26/30 --- ---       Intellectual Functioning:           Standard Score Percentile   Test of Premorbid Functioning: 124 95 Well Above Average       Memory:          NAB Memory Module, Form 2: Standard Score/ T Score Percentile   List Learning       Total Trials 1-3 27/36 (47) 38 Average    List B 4/12 (39) 14 Below Average    Short Delay Free Recall 10/12 (47) 38 Average    Long Delay Free Recall 10/12 (46) 34 Average    Retention Percentage 100 (51) 54 Average    Recognition Discriminability 11 (56) 73 Average  Shape Learning       Total Trials 1-3 17/27 (43) 25 Average    Delayed Recall 5/9 (36) 8 Well Below Average    Retention Percentage 71 (40) 16 Below Average    Recognition Discriminability 8 (53) 62 Average  Story Learning       Immediate Recall 56/80 (48) 42 Average    Delayed Recall 26/40 (43) 25 Average    Retention Percentage 87 (48) 42 Average  Daily Living Memory       Immediate Recall 41/51 (36) 8 Well Below Average    Delayed Recall 14/17 (35) 7 Well Below Average    Retention Percentage 88  (49) 46 Average    Recognition Hits 9/10 (49) 46 Average       Attention/Executive Function:          Trail Making Test (TMT): Raw Score (T Score) Percentile     Part A 28 secs.,  0 errors (47) 38 Average    Part B 63 secs.,  0 errors (47) 38 Average         Scaled Score Percentile   WAIS-IV Coding: 10 50 Average       NAB Attention Module, Form 1: T Score Percentile     Digits Forward 38 12 Below Average    Digits Backwards 43 25 Average       D-KEFS Color-Word Interference Test: Raw Score (Scaled Score) Percentile     Color Naming 34 secs. (8) 25 Average    Word Reading 24 secs. (9) 37 Average    Inhibition 55 secs. (10) 50 Average      Total Errors 0 errors (12) 75 Above Average    Inhibition/Switching 55 secs. (11) 63 Average      Total Errors 0 errors (12) 75 Above Average       D-KEFS Verbal Fluency Test: Raw Score (Scaled Score) Percentile  Letter Total Correct 40 (10) 50 Average    Category Total Correct 41 (10) 50 Average    Category Switching Total Correct 10 (5) 5 Well Below Average    Category Switching Accuracy 8 (6) 9 Below Average      Total Set Loss Errors 1 (11) 63 Average      Total Repetition Errors 4 (8) 25 Average       D-KEFS 20 Questions Test: Scaled Score Percentile     Total Weighted Achievement Score 14 91 Above Average    Initial Abstraction Score 18 >99 Exceptionally High       [place] Card Sorting Test: Raw Score Percentile     Categories (trials) 5 (64) >16 Within Normal Limits    Total Errors 10 62 Average    Perseverative Errors 4 46 Average    Non-Perseverative Errors 6 34 Average    Failure to Maintain Set 0 --- ---       Language:          NAB Language Module, Form 1: T Score Percentile     Auditory Comprehension 53 62 Average    Naming 31/31 (53) 62 Average       Visuospatial/Visuoconstruction:      Raw Score Percentile   Clock Drawing: 10/10 --- Within Normal Limits       NAB Spatial Module, Form 1: T Score Percentile      Figure Drawing Copy 49 46 Average    Figure Drawing Immediate Recall 26 1 Exceptionally Low        Scaled Score Percentile   WAIS-IV Block Design: 7 16 Below Average  WAIS-IV Matrix Reasoning: 14 91 Above Average       Mood and Personality:      Raw Score Percentile   Beck Depression Inventory - II: 39 --- Severe  PROMIS Anxiety Questionnaire: 28 --- Severe  PTSD Checklist for DSM-5: 53 --- Above Threshold       Additional Questionnaires:      Raw Score Percentile   PROMIS Sleep Disturbance Questionnaire: 30 --- Moderate       [place] Postconcussion Symptom Inventory: Raw Score Percentile     Headaches 4 --- Moderate or Greater    Dizziness/Light-headed 1 --- Mild    Nausea/Feeling Sick 0 --- None    Fatigue 4 --- Moderate or Greater    Extra Sensitive to Noises 2 --- Mild    Irritable 3 --- Moderate or Greater    Feeling Sad 4 --- Moderate or Greater    Nervous or Tense 3 --- Moderate or Greater    Temper Problems 1 --- Mild    Poor Concentration 4 --- Moderate or Greater    Memory Problems 4 --- Moderate or Greater    Difficulty Reading 3 --- Moderate or Greater    Poor Sleep 3 --- Moderate or Greater   Informed Consent and Coding/Compliance:   Ms. Cammack was provided with a verbal description of the nature and purpose of the present neuropsychological evaluation. Also reviewed were the foreseeable risks and/or discomforts and benefits of the procedure, limits of confidentiality, and mandatory reporting requirements of this provider. The patient was given the opportunity to ask questions and receive answers about the evaluation. Oral consent to participate was provided by the patient.   This evaluation was conducted by Christia Reading, Ph.D., licensed clinical neuropsychologist. Ms. Blazina completed a comprehensive clinical interview with Dr. Melvyn Novas, billed as one unit [phone], and 160 minutes of cognitive testing  and scoring, billed as one unit [phone] and four additional  units [id number]. Psychometrist Milana Kidney, B.S., assisted Dr. Melvyn Novas with test administration and scoring procedures. As a separate and discrete service, Dr. Melvyn Novas spent a total of 180 minutes in interpretation and report writing billed as one unit [phone] and two units 96133.

## 2019-11-27 NOTE — Progress Notes (Signed)
   Psychometrician Note   Cognitive testing was administered to Julie Johnston by Julie Johnston, B.S. (psychometrist) under the supervision of Dr. Christia Reading, Ph.D., licensed psychologist on 11/27/19. Julie Johnston did not appear overtly distressed by the testing session per behavioral observation or responses across self-report questionnaires. Dr. Christia Reading, Ph.D. checked in with Julie Johnston as needed to manage any distress related to testing procedures (if applicable). Rest breaks were offered.    The battery of tests administered was selected by Dr. Christia Reading, Ph.D. with consideration to Julie Johnston's current level of functioning, the nature of her symptoms, emotional and behavioral responses during interview, level of literacy, observed level of motivation/effort, and the nature of the referral question. This battery was communicated to the psychometrist. Communication between Dr. Christia Reading, Ph.D. and the psychometrist was ongoing throughout the evaluation and Dr. Christia Reading, Ph.D. was immediately accessible at all times. Dr. Christia Reading, Ph.D. provided supervision to the psychometrist on the date of this service to the extent necessary to assure the quality of all services provided.    Julie Johnston will return within approximately 1-2 weeks for an interactive feedback session with Julie Johnston at which time her test performances, clinical impressions, and treatment recommendations will be reviewed in detail. Julie Johnston understands she can contact our office should she require our assistance before this time.  A total of 160 minutes of billable time were spent face-to-face with Julie Johnston by the psychometrist. This includes both test administration and scoring time. Billing for these services is reflected in the clinical report generated by Dr. Christia Reading, Ph.D..  This note reflects time spent with the psychometrician and does not include test scores or any clinical  interpretations made by Julie Johnston. The full report will follow in a separate note.

## 2019-12-04 ENCOUNTER — Other Ambulatory Visit: Payer: Self-pay

## 2019-12-04 ENCOUNTER — Ambulatory Visit (INDEPENDENT_AMBULATORY_CARE_PROVIDER_SITE_OTHER): Payer: BC Managed Care – PPO | Admitting: Psychology

## 2019-12-04 DIAGNOSIS — F411 Generalized anxiety disorder: Secondary | ICD-10-CM | POA: Diagnosis not present

## 2019-12-04 DIAGNOSIS — F4310 Post-traumatic stress disorder, unspecified: Secondary | ICD-10-CM | POA: Diagnosis not present

## 2019-12-04 DIAGNOSIS — F332 Major depressive disorder, recurrent severe without psychotic features: Secondary | ICD-10-CM | POA: Diagnosis not present

## 2019-12-04 DIAGNOSIS — R41840 Attention and concentration deficit: Secondary | ICD-10-CM

## 2019-12-04 DIAGNOSIS — S069X0S Unspecified intracranial injury without loss of consciousness, sequela: Secondary | ICD-10-CM

## 2019-12-04 NOTE — Progress Notes (Signed)
   Neuropsychology Feedback Session Julie Johnston. [facility]  Reason for Referral:   Julie Johnston a 49 y.o. right-handed Caucasian female referred by Iran Planas, PA-C,to characterize hercurrent cognitive functioning and assist with diagnostic clarity and treatment planning in the context of a potential concussive event and several significant psychiatric comorbidities.   Feedback:   Julie Johnston completed a comprehensive neuropsychological evaluation on 11/27/2019. Please refer to that encounter for the full report and recommendations. Briefly, results suggested performance variability (i.e., some scores were below expectation while others were appropriate) across numerous cognitive domains including attention/concentration, executive functioning, visuospatial abilities, and learning and memory. There were no cognitive domains which yielded consistent weaknesses across testing. It is possible that scores in the average range represent a decline from a previously higher level of functioning. However, there is no testing available for comparison purposes to verify this. Across mood-related questionnaires, Julie Johnston reported acutely severe symptoms of both depression and anxiety. She also elevated a trauma questionnaire suggesting current symptoms consistent with an active diagnosis of PTSD. She reported non-restful sleep and poor adherence to her CPAP machine, as well as symptoms of chronic pain and frequent headaches. All of these variables have been shown to influence cognitive dysfunction, especially across domains of processing speed, attention/concentration, executive functioning, and learning and memory. They would additionally exacerbate an already dysregulated attention network due to her history of ADHD. Overall, there is no reason to suspect permanent brain damage or dysfunction stemming from her recent accident. Cognitive difficulties should improve  along with improving mood, sleep, and pain-related dysfunction.  Julie Johnston was unaccompanied during the current telephone call. She was within her residence while I was within my office. I discussed the limitations of evaluation and management by telemedicine and the availability of in person appointments. Julie Johnston expressed her understanding and agreed to proceed. Content of the current session focused on the results of her neuropsychological evaluation. Julie Johnston was given the opportunity to ask questions and her questions were answered. She was encouraged to reach out should additional questions arise. A copy of her report was is available on MyChart.     31 minutes were spent conducting the current feedback session with Julie Johnston, billed as one unit [phone].

## 2019-12-04 NOTE — Patient Instructions (Signed)
Recommendations: Factors important to concussion rehabilitation are outlined below:   In my opinion, mood-related dysfunction appears to be Julie Johnston' most prominent symptom impacting her cognition and overall functioning. Julie Johnston reported acutely severe symptoms of both anxiety and depression. She also reported passive suicidal ideation as recent as three weeks prior to the current evaluation. She reported having her medications adjusted approximately two weeks prior. As it is common for mood-related medications to not reach their peak level of effectiveness until 4-6 weeks, Julie Johnston is encouraged to continue taking these medications as prescribed to assess their true effectiveness.   A combination of medication and psychotherapy has been shown to be most effective at treating symptoms of anxiety and depression. Julie Johnston reported working with an individual therapist weekly. However, she was unable to identify any goals which they were currently working towards and described their sessions as talking about the "crisis of the week." I would strongly encourage Julie Johnston and her therapist to sit down and engage in purposeful goal setting behaviors so that they can be working towards a tangible outcome which will positively impact Julie Johnston' life. Purely supportive therapy is likely not sufficient in this case. While this type of therapy certainly has benefits in the midst of unexpected events or distress, there is the danger of getting stuck in this routine and not working towards a tangible goal for a prolonged period of time. Julie Johnston would benefit from an active and collaborative therapeutic environment, rather than one purely supportive in nature. Recommended treatment modalities include Cognitive Behavioral Therapy (CBT) or Acceptance and Commitment Therapy (ACT). Should she desire to address her trauma history, Cognitive Processing Therapy (CPT) or trauma-focused CBT would be recommended.   [name] reported a history of obstructive sleep apnea. However, she described poor adherence to her CPAP machine and also noted that when she does wear it, she will commonly take it off while asleep in the middle of the night, stating that she is lucky if she has it on for one hour. Untreated or poorly managed sleep apnea will directly increase fatigue throughout the day and worsen cognitive dysfunction. It has also been linked to increased risk for heart attack, stroke, and future cognitive decline (i.e., dementia). Maximizing her CPAP usage will be a vital part of her rehabilitation efforts.   Julie Johnston reported being involved in outpatient vestibular therapy but had skipped her last few sessions due to her not engaging in exercises as a part of her day-to-day routine. Proper adherence to these exercises will increase the likelihood of her continued recovery.    Ongoing and consistent treatment for ADHD is also recommended as it is unclear how effective her as needed 10mg  Vyvanse medication is at managing symptoms of this condition.    She reported experiencing constant headaches, potentially stemming from neck pain/damage from her horse-related accident. She should discuss with her PCP options for directly treating ongoing neck discomfort and headache symptoms. She could also consider additional treatments such as working with a Restaurant manager, fast food or acupuncture.    Consider engaging in regular exercise. Research has suggest that regular aerobic exercise has aided individuals recovering from persisting post-concussion symptoms. Exercise has also been show to greatly improve mood and sleep.   I would encourage Julie Johnston and her PCP to sit down and discuss what she feels to be her most impairing symptom. As it can become overwhelming to try and focus on numerous rehabilitation efforts at one point in time, I would prioritize  treating whichever she feel is most impactful. She will likely find that as one  symptom improves, others will follow naturally. For example, improving her mood would likely improve her sleep without direct intervention. Once one condition is treated appropriately, then she should move on to the next most impactful symptom.   Should all of these symptoms become managed well and Julie Johnston continue to report cognitive dysfunction, a repeat evaluation could be considered at that time. The current evaluation would serve as a baseline to compare any future evaluations against.  Regarding work, based upon the extent of psychiatric symptoms alone, it would be quite likely that Julie Johnston would function at a diminished capacity relative to her degree of functioning prior to this exacerbation. For example, she might process new information more slowly than before or have to exert far more mental effort to reach levels that she previous was able to obtain far easier. These symptoms would be expected to improve over time along with post-concussion symptoms, but would certainly impact current work efforts. I would recommend that Julie Johnston directly discuss ongoing difficulties with her boss or at-work supervisor to see if certain accommodations can be made. Should the decision be made to return to work, she should slowly resume responsibilities, gradually building up to her previous level of responsibility over time. If placed back at 100% right away, she will likely have difficulties performing work responsibilities to a satisfactory degree.   If interested, there are some activities which have therapeutic value and can be useful in keeping her cognitively stimulated. For suggestions, Julie Johnston is encouraged to go to the following website: https://www.[facility].org/get-to-know-barrow/centers-programs/neurorehabilitation-center/neuro-rehab-apps-and-games/ which has options, categorized by level of difficulty. It should be noted that these activities should not be viewed as a substitute for  therapy.  When learning new information, she would benefit from information being broken up into small, manageable pieces. She may also find it helpful to articulate the material in her own words and in a context to promote encoding at the onset of a new task. This material may need to be repeated multiple times to promote encoding.  Memory can be improved using internal strategies such as rehearsal, repetition, chunking, mnemonics, association, and imagery. External strategies such as written notes in a consistently used memory journal, visual and nonverbal auditory cues such as a calendar on the refrigerator or appointments with alarm, such as on a cell phone, can also help maximize recall.    To address problems with fluctuating attention, she may wish to consider:   -Avoiding external distractions when needing to concentrate   -Limiting exposure to fast paced environments with multiple sensory demands   -Writing down complicated information and using checklists   -Attempting and completing one task at a time (i.e., no multi-tasking)   -Verbalizing aloud each step of a task to maintain focus   -Reducing the amount of information considered at one time

## 2019-12-14 NOTE — Telephone Encounter (Signed)
Received fax from [facility] and [employer] was approved.  Reference [id number] Valid: 10/03/2019 - 03/21/2038. - CF

## 2019-12-20 ENCOUNTER — Other Ambulatory Visit: Payer: Self-pay | Admitting: Physician Assistant

## 2019-12-20 DIAGNOSIS — I10 Essential (primary) hypertension: Secondary | ICD-10-CM

## 2019-12-22 ENCOUNTER — Other Ambulatory Visit: Payer: Self-pay | Admitting: Physician Assistant

## 2020-02-06 ENCOUNTER — Other Ambulatory Visit: Payer: Self-pay | Admitting: Physician Assistant

## 2020-02-06 DIAGNOSIS — G43801 Other migraine, not intractable, with status migrainosus: Secondary | ICD-10-CM

## 2020-02-12 ENCOUNTER — Other Ambulatory Visit: Payer: Self-pay | Admitting: Sports Medicine

## 2020-02-12 DIAGNOSIS — J111 Influenza due to unidentified influenza virus with other respiratory manifestations: Secondary | ICD-10-CM

## 2020-02-19 LAB — HM COLONOSCOPY

## 2020-02-22 LAB — HEMOGLOBIN A1C: Hemoglobin A1C: 6.5

## 2020-02-25 ENCOUNTER — Encounter: Payer: Self-pay | Admitting: Physician Assistant

## 2020-02-25 DIAGNOSIS — K635 Polyp of colon: Secondary | ICD-10-CM | POA: Insufficient documentation

## 2020-02-25 DIAGNOSIS — K5730 Diverticulosis of large intestine without perforation or abscess without bleeding: Secondary | ICD-10-CM | POA: Insufficient documentation

## 2020-02-25 DIAGNOSIS — K648 Other hemorrhoids: Secondary | ICD-10-CM | POA: Insufficient documentation

## 2020-02-27 ENCOUNTER — Other Ambulatory Visit: Payer: Self-pay

## 2020-02-27 ENCOUNTER — Ambulatory Visit (INDEPENDENT_AMBULATORY_CARE_PROVIDER_SITE_OTHER): Payer: BC Managed Care – PPO | Admitting: Physician Assistant

## 2020-02-27 ENCOUNTER — Encounter: Payer: Self-pay | Admitting: Physician Assistant

## 2020-02-27 DIAGNOSIS — E119 Type 2 diabetes mellitus without complications: Secondary | ICD-10-CM | POA: Diagnosis not present

## 2020-02-27 DIAGNOSIS — E785 Hyperlipidemia, unspecified: Secondary | ICD-10-CM

## 2020-02-27 MED ORDER — SEMAGLUTIDE-WEIGHT MANAGEMENT 0.5 MG/0.5ML ~~LOC~~ SOAJ: 0.5000 mg | SUBCUTANEOUS | 0 refills | Status: DC

## 2020-02-27 MED ORDER — ROSUVASTATIN CALCIUM 10 MG PO TABS: 10.0000 mg | ORAL_TABLET | Freq: Every day | ORAL | 3 refills | Status: DC

## 2020-02-27 NOTE — Progress Notes (Signed)
Subjective:    Patient ID: Julie Johnston, female    DOB: 04-10-1970, 49 y.o.   MRN: [id number]  HPI  Pt is a 49 yo morbidly obese female with recent Dx of T2DM with A1C of 6.5.   She comes into the clinic to follow up.   She was seen at medical nutrition for weight. Her A1c was 6.5. They wanted to start her on Saxenda. She wonders if she could try we will go vegan or Ozempic. She has never been on anything for her sugars. She is desperately trying to lose weight. She denies any open sores or wounds. She denies any hypoglycemic events. She is not exercising or on any diet plan. She is supposed to meet with nutritionist in the next month.  .. Active Ambulatory Problems    Diagnosis Date Noted  . Lyme disease 05/09/2015  . Insomnia 05/09/2015  . Palpitations 05/09/2015  . Chronic fatigue 05/09/2015  . No energy 05/09/2015  . Hyperlipidemia 05/09/2015  . Pap smear abnormality of cervix with LGSIL 02/07/2014  . Attention deficit hyperactivity disorder (ADHD), predominantly inattentive type 05/09/2015  . Major depressive disorder   . Pre-diabetes 05/14/2015  . Obstructive sleep apnea 06/18/2015  . Urine ketones 09/22/2015  . Fibromyalgia 09/22/2015  . Obesity 09/22/2015  . Drug-induced weight gain 02/23/2016  . Migraine 11/29/2017  . Dysuria 11/29/2017  . Potential exposure to STD 12/16/2017  . Frequent infections 05/08/2018  . Decreased thyroid stimulating hormone (TSH) level 05/08/2018  . Equivocal stress echocardiogram 05/17/2018  . Influenza-like illness 07/20/2018  . Essential hypertension 06/04/2019  . Acute thoracic back pain 10/01/2019  . PTSD (post-traumatic stress disorder) 10/01/2019  . Right posterior sixth rib fracture 10/02/2019  . Generalized anxiety disorder 11/07/2019  . Mild traumatic brain injury 09/12/2019  . Pulmonary collapse 06/10/2014  . Polyp of transverse colon 02/25/2020  . Diverticulosis of colon 02/25/2020  . Internal hemorrhoids  02/25/2020   Resolved Ambulatory Problems    Diagnosis Date Noted  . Influenza B 12/16/2017  . Post concussion syndrome 10/01/2019   Past Medical History:  Diagnosis Date  . Obesity (BMI 35.0-39.9 without comorbidity)      Review of Systems  All other systems reviewed and are negative.      Objective:   Physical Exam Vitals reviewed.  Constitutional:      Appearance: Normal appearance. She is obese.  HENT:     Head: Normocephalic.  Cardiovascular:     Rate and Rhythm: Normal rate and regular rhythm.     Pulses: Normal pulses.  Pulmonary:     Effort: Pulmonary effort is normal.     Breath sounds: Normal breath sounds.  Neurological:     General: No focal deficit present.     Mental Status: She is alert and oriented to person, place, and time.  Psychiatric:        Mood and Affect: Mood normal.           Assessment & Plan:  [name] KitchenMarland KitchenSheryll was seen today for diabetes.  Diagnoses and all orders for this visit:  Morbid obesity ([place]) -     Semaglutide-Weight Management 0.5 MG/0.5ML SOAJ; Inject 0.5 mg into the skin once a week.  Type 2 diabetes mellitus without complication, without long-term current use of insulin ([facility]) -     Semaglutide-Weight Management 0.5 MG/0.5ML SOAJ; Inject 0.5 mg into the skin once a week. -     rosuvastatin (CRESTOR) 10 MG tablet; Take 1 tablet (10 mg total) by mouth  daily.  Dyslipidemia, goal LDL below 70 -     rosuvastatin (CRESTOR) 10 MG tablet; Take 1 tablet (10 mg total) by mouth daily.   Per EMR.  A1C 6.5. Discussed diet and nutrition. Discussed options wants to start [facility] instead of saxenda.  Coupon card given. Pt aware of side effects and titration up.  Started crestor for statin.  BP to goal.  Needs foot exam and eye exam.  Needs pneumonia vaccine.  Flu and covid UTD.   Follow up in 3 months.    [name] Kitchen.Discussed low carb diet with 1500 calories and 80g of protein.  Exercising at least 150 minutes a week.  My Fitness Pal  could be a Microbiologist.  Continue following with weight loss management.   Spent 30 minutes with patient discussing diet, medication, how to use, weight loss, diabetes and side effects as well as management.

## 2020-02-27 NOTE — Patient Instructions (Signed)

## 2020-02-28 ENCOUNTER — Encounter: Payer: Self-pay | Admitting: Physician Assistant

## 2020-02-28 NOTE — Telephone Encounter (Signed)
Do you want her to try [facility] for the 6 months or let her do Saxenda long term with the weight loss clinic since it's already approved?

## 2020-03-08 ENCOUNTER — Other Ambulatory Visit: Payer: Self-pay | Admitting: Physician Assistant

## 2020-03-08 DIAGNOSIS — I10 Essential (primary) hypertension: Secondary | ICD-10-CM

## 2020-03-17 ENCOUNTER — Encounter: Payer: Self-pay | Admitting: Physician Assistant

## 2020-03-18 MED ORDER — WEGOVY 1 MG/0.5ML ~~LOC~~ SOAJ: 1.0000 mg | SUBCUTANEOUS | 0 refills | Status: DC

## 2020-03-28 ENCOUNTER — Other Ambulatory Visit: Payer: Self-pay | Admitting: Physician Assistant

## 2020-03-28 DIAGNOSIS — E119 Type 2 diabetes mellitus without complications: Secondary | ICD-10-CM

## 2020-03-28 DIAGNOSIS — E785 Hyperlipidemia, unspecified: Secondary | ICD-10-CM

## 2020-04-03 ENCOUNTER — Encounter: Payer: Self-pay | Admitting: Physician Assistant

## 2020-04-03 DIAGNOSIS — E785 Hyperlipidemia, unspecified: Secondary | ICD-10-CM

## 2020-04-03 DIAGNOSIS — E119 Type 2 diabetes mellitus without complications: Secondary | ICD-10-CM

## 2020-04-03 MED ORDER — ROSUVASTATIN CALCIUM 10 MG PO TABS: 10.0000 mg | ORAL_TABLET | Freq: Every day | ORAL | 3 refills | Status: DC

## 2020-04-03 NOTE — Addendum Note (Signed)
Addended byAnnamaria Helling on: 04/03/2020 04:23 PM   Modules accepted: Orders

## 2020-04-07 ENCOUNTER — Encounter: Payer: Self-pay | Admitting: Physician Assistant

## 2020-04-14 ENCOUNTER — Encounter: Payer: Self-pay | Admitting: Physician Assistant

## 2020-04-14 DIAGNOSIS — E119 Type 2 diabetes mellitus without complications: Secondary | ICD-10-CM

## 2020-04-14 DIAGNOSIS — R2689 Other abnormalities of gait and mobility: Secondary | ICD-10-CM

## 2020-04-15 NOTE — Telephone Encounter (Signed)
Call pt: we do PT here at [facility]. Ok to place referral for both here and [facility] and then opthamologist.

## 2020-04-15 NOTE — Telephone Encounter (Signed)
Referrals placed. Cindy - see note of where to send.

## 2020-04-17 ENCOUNTER — Ambulatory Visit (INDEPENDENT_AMBULATORY_CARE_PROVIDER_SITE_OTHER): Payer: 59 | Admitting: Rehabilitative and Restorative Service Providers"

## 2020-04-17 ENCOUNTER — Other Ambulatory Visit: Payer: Self-pay

## 2020-04-17 ENCOUNTER — Encounter: Payer: Self-pay | Admitting: Rehabilitative and Restorative Service Providers"

## 2020-04-17 DIAGNOSIS — R2689 Other abnormalities of gait and mobility: Secondary | ICD-10-CM

## 2020-04-17 DIAGNOSIS — R29818 Other symptoms and signs involving the nervous system: Secondary | ICD-10-CM | POA: Diagnosis not present

## 2020-04-17 DIAGNOSIS — R2681 Unsteadiness on feet: Secondary | ICD-10-CM
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
## 2020-04-17 NOTE — Patient Instructions (Signed)
Access Code: [email or web address] URL: [email or web address] Date: 04/17/2020 Prepared by: Rudell Cobb  Exercises Romberg Stance with Eyes Closed - 2 x daily - 7 x weekly - 1 sets - 3 reps - 30 seconds hold Seated Gaze Stabilization with Head Rotation - 2 x daily - 7 x weekly - 1 sets - 1 reps - 30 seconds hold

## 2020-04-18 NOTE — Telephone Encounter (Signed)
Referrals sent as requested. - CF

## 2020-04-19 ENCOUNTER — Other Ambulatory Visit: Payer: Self-pay | Admitting: Physician Assistant

## 2020-04-20 ENCOUNTER — Telehealth: Payer: 59 | Admitting: Nurse Practitioner

## 2020-04-20 DIAGNOSIS — R531 Weakness: Secondary | ICD-10-CM | POA: Diagnosis not present

## 2020-04-20 DIAGNOSIS — R42 Dizziness and giddiness: Secondary | ICD-10-CM
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
## 2020-04-21 ENCOUNTER — Encounter: Payer: Self-pay | Admitting: Physician Assistant

## 2020-04-21 MED ORDER — WEGOVY 2.4 MG/0.75ML ~~LOC~~ SOAJ: 2.4000 mg | SUBCUTANEOUS | 1 refills | Status: DC

## 2020-04-21 MED ORDER — WEGOVY 1.7 MG/0.75ML ~~LOC~~ SOAJ: 1.7000 mg | SUBCUTANEOUS | 0 refills | Status: DC

## 2020-04-21 NOTE — Addendum Note (Signed)
Addended byAnnamaria Helling on: 04/21/2020 04:21 PM   Modules accepted: Orders

## 2020-04-22 ENCOUNTER — Other Ambulatory Visit: Payer: Self-pay

## 2020-04-22 ENCOUNTER — Ambulatory Visit (INDEPENDENT_AMBULATORY_CARE_PROVIDER_SITE_OTHER): Payer: 59 | Admitting: Rehabilitative and Restorative Service Providers"

## 2020-04-22 DIAGNOSIS — R2689 Other abnormalities of gait and mobility: Secondary | ICD-10-CM

## 2020-04-22 DIAGNOSIS — R29818 Other symptoms and signs involving the nervous system: Secondary | ICD-10-CM

## 2020-04-22 DIAGNOSIS — R2681 Unsteadiness on feet: Secondary | ICD-10-CM | POA: Diagnosis not present

## 2020-04-22 NOTE — Patient Instructions (Signed)
Access Code: [email or web address] URL: [email or web address] Date: 04/22/2020 Prepared by: Rudell Cobb  Exercises Romberg Stance with Eyes Closed - 2 x daily - 7 x weekly - 1 sets - 3 reps - 30 seconds hold Seated Gaze Stabilization with Head Rotation - 2 x daily - 7 x weekly - 1 sets - 1 reps - 30 seconds hold Seated Right Head Turns Vestibular Habituation - 2 x daily - 7 x weekly - 1 sets - 5 reps Seated Head Nods Vestibular Habituation - 2 x daily - 7 x weekly - 1 sets - 3 reps
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
## 2020-04-28 ENCOUNTER — Other Ambulatory Visit: Payer: Self-pay | Admitting: Physician Assistant

## 2020-04-28 DIAGNOSIS — G43801 Other migraine, not intractable, with status migrainosus: Secondary | ICD-10-CM

## 2020-04-29 ENCOUNTER — Ambulatory Visit (INDEPENDENT_AMBULATORY_CARE_PROVIDER_SITE_OTHER): Payer: 59 | Admitting: Rehabilitative and Restorative Service Providers"

## 2020-04-29 ENCOUNTER — Other Ambulatory Visit: Payer: Self-pay

## 2020-04-29 DIAGNOSIS — R2689 Other abnormalities of gait and mobility: Secondary | ICD-10-CM | POA: Diagnosis not present

## 2020-04-29 DIAGNOSIS — R29818 Other symptoms and signs involving the nervous system: Secondary | ICD-10-CM | POA: Diagnosis not present

## 2020-04-29 DIAGNOSIS — R2681 Unsteadiness on feet: Secondary | ICD-10-CM
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
## 2020-04-29 NOTE — Patient Instructions (Signed)
Access Code: [email or web address] URL: [email or web address] Date: 04/29/2020 Prepared by: Rudell Cobb  Program Notes *Couple walking with your outdoor animal chores.  *Add ball toss R and L hands for eye/hand coordination.   Exercises Romberg Stance with Eyes Closed - 2 x daily - 7 x weekly - 1 sets - 3 reps - 30 seconds hold Standing Gaze Stabilization with Head Rotation - 2 x daily - 7 x weekly - 1 sets - 10 reps Standing Horizontal Head Rotation Vestibular Habituation - 2 x daily - 7 x weekly - 1 sets - 5 reps Standing Head Nod Vestibular Habituation - 2 x daily - 7 x weekly - 1 sets - 5 reps

## 2020-05-06 ENCOUNTER — Ambulatory Visit (INDEPENDENT_AMBULATORY_CARE_PROVIDER_SITE_OTHER): Payer: 59 | Admitting: Rehabilitative and Restorative Service Providers"

## 2020-05-06 ENCOUNTER — Other Ambulatory Visit: Payer: Self-pay

## 2020-05-06 ENCOUNTER — Encounter: Payer: Self-pay | Admitting: Rehabilitative and Restorative Service Providers"

## 2020-05-06 DIAGNOSIS — R2689 Other abnormalities of gait and mobility: Secondary | ICD-10-CM | POA: Diagnosis not present

## 2020-05-06 DIAGNOSIS — R29818 Other symptoms and signs involving the nervous system: Secondary | ICD-10-CM | POA: Diagnosis not present

## 2020-05-06 DIAGNOSIS — R2681 Unsteadiness on feet: Secondary | ICD-10-CM
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
## 2020-05-07 ENCOUNTER — Encounter: Payer: Self-pay | Admitting: Physician Assistant

## 2020-05-07 DIAGNOSIS — R1010 Upper abdominal pain, unspecified: Secondary | ICD-10-CM

## 2020-05-07 DIAGNOSIS — R110 Nausea: Secondary | ICD-10-CM

## 2020-05-12 ENCOUNTER — Encounter: Payer: Self-pay | Admitting: Physician Assistant

## 2020-05-13 NOTE — Telephone Encounter (Signed)
Notes requested

## 2020-05-14 ENCOUNTER — Telehealth (INDEPENDENT_AMBULATORY_CARE_PROVIDER_SITE_OTHER): Payer: 59 | Admitting: Physician Assistant

## 2020-05-14 VITALS — Ht 65.0 in | Wt 289.0 lb

## 2020-05-14 DIAGNOSIS — M797 Fibromyalgia: Secondary | ICD-10-CM | POA: Diagnosis not present

## 2020-05-14 DIAGNOSIS — F332 Major depressive disorder, recurrent severe without psychotic features: Secondary | ICD-10-CM

## 2020-05-14 DIAGNOSIS — H532 Diplopia: Secondary | ICD-10-CM

## 2020-05-14 DIAGNOSIS — R42 Dizziness and giddiness: Secondary | ICD-10-CM

## 2020-05-14 DIAGNOSIS — F4310 Post-traumatic stress disorder, unspecified: Secondary | ICD-10-CM

## 2020-05-14 DIAGNOSIS — F900 Attention-deficit hyperactivity disorder, predominantly inattentive type: Secondary | ICD-10-CM | POA: Diagnosis not present

## 2020-05-14 DIAGNOSIS — S069X0S Unspecified intracranial injury without loss of consciousness, sequela: Secondary | ICD-10-CM

## 2020-05-14 DIAGNOSIS — F411 Generalized anxiety disorder: Secondary | ICD-10-CM

## 2020-05-14 MED ORDER — WEGOVY 2.4 MG/0.75ML ~~LOC~~ SOAJ: 2.4000 mg | SUBCUTANEOUS | 5 refills | Status: DC

## 2020-05-14 MED ORDER — WEGOVY 2.4 MG/0.75ML ~~LOC~~ SOAJ
2.4000 mg | SUBCUTANEOUS | 5 refills | Status: DC
Start: 2020-05-14 — End: 2020-09-19
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
## 2020-05-14 NOTE — Progress Notes (Signed)
Disability application pending, wants to discuss documentation they are going to request  Will check blood pressure and give reading to [facility]

## 2020-05-18 ENCOUNTER — Other Ambulatory Visit: Payer: Self-pay | Admitting: Physician Assistant

## 2020-05-19 ENCOUNTER — Encounter: Payer: Self-pay | Admitting: Physician Assistant

## 2020-05-19 DIAGNOSIS — H532 Diplopia: Secondary | ICD-10-CM | POA: Insufficient documentation

## 2020-05-19 DIAGNOSIS — R42 Dizziness and giddiness: Secondary | ICD-10-CM | POA: Insufficient documentation

## 2020-05-22 ENCOUNTER — Encounter: Payer: 59 | Admitting: Rehabilitative and Restorative Service Providers"

## 2020-05-27 ENCOUNTER — Encounter: Payer: Self-pay | Admitting: Physician Assistant

## 2020-05-27 ENCOUNTER — Other Ambulatory Visit: Payer: Self-pay

## 2020-05-27 ENCOUNTER — Ambulatory Visit (INDEPENDENT_AMBULATORY_CARE_PROVIDER_SITE_OTHER): Payer: 59 | Admitting: Physician Assistant

## 2020-05-27 DIAGNOSIS — R2689 Other abnormalities of gait and mobility: Secondary | ICD-10-CM

## 2020-05-27 DIAGNOSIS — R2681 Unsteadiness on feet: Secondary | ICD-10-CM

## 2020-05-27 DIAGNOSIS — E118 Type 2 diabetes mellitus with unspecified complications: Secondary | ICD-10-CM | POA: Diagnosis not present

## 2020-05-27 NOTE — Progress Notes (Signed)
Subjective:    Patient ID: Julie Johnston, female    DOB: 11/10/1970, 50 y.o.   MRN: [id number]  HPI  Patient is a 50 year old obese female with hypertension, migraines, type 2 diabetes, OSA who presents to the clinic to fill out paperwork for bariatric surgery.  Patient has had an extensive history with her weight struggle.  She has almost always remember trying to lose weight.  For the last 5 years she is actively spent trying to lose weight.  She has tried keto, weight watchers, calorie counting with some improvement and plateau or weight gain.  She has tried walking, dancing, rockclimbing, high intensity training, running, weightlifting as activities none with any substantial benefit.  As far as medication she is currently on wegovy. She has failed phentermine, Topamax, Wellbutrin, naltrexone, Saxenda.  She continues to have dizziness/instability and goes to vestibular rehab.  Her next appointment is Friday.  She does feel like vestibular rehab does help.  She has not been called by neurology.  She did see neuropsych but has not actually been seen by neurologist.   .. Active Ambulatory Problems    Diagnosis Date Noted  . Lyme disease 05/09/2015  . Insomnia 05/09/2015  . Palpitations 05/09/2015  . Chronic fatigue 05/09/2015  . No energy 05/09/2015  . Hyperlipidemia 05/09/2015  . Pap smear abnormality of cervix with LGSIL 02/07/2014  . Attention deficit hyperactivity disorder (ADHD), predominantly inattentive type 05/09/2015  . Major depressive disorder   . Pre-diabetes 05/14/2015  . Obstructive sleep apnea 06/18/2015  . Urine ketones 09/22/2015  . Fibromyalgia 09/22/2015  . Obesity 09/22/2015  . Drug-induced weight gain 02/23/2016  . Migraine 11/29/2017  . Dysuria 11/29/2017  . Potential exposure to STD 12/16/2017  . Frequent infections 05/08/2018  . Decreased thyroid stimulating hormone (TSH) level 05/08/2018  . Equivocal stress echocardiogram 05/17/2018  .  Influenza-like illness 07/20/2018  . Essential hypertension 06/04/2019  . Acute thoracic back pain 10/01/2019  . PTSD (post-traumatic stress disorder) 10/01/2019  . Right posterior sixth rib fracture 10/02/2019  . Generalized anxiety disorder 11/07/2019  . Mild traumatic brain injury 09/12/2019  . Pulmonary collapse 06/10/2014  . Polyp of transverse colon 02/25/2020  . Diverticulosis of colon 02/25/2020  . Internal hemorrhoids 02/25/2020  . Dizziness 05/19/2020  . Double vision 05/19/2020  . Vertigo 05/19/2020  . Controlled type 2 diabetes mellitus with complication, without long-term current use of insulin ([place]) 05/27/2020  . Severe obesity (BMI >= 40) ([place]) 05/27/2020   Resolved Ambulatory Problems    Diagnosis Date Noted  . Influenza B 12/16/2017  . Post concussion syndrome 10/01/2019   Past Medical History:  Diagnosis Date  . Obesity (BMI 35.0-39.9 without comorbidity)      Review of Systems See HPI.     Objective:   Physical Exam Vitals reviewed.  Constitutional:      Appearance: Normal appearance. She is obese.  Cardiovascular:     Rate and Rhythm: Normal rate and regular rhythm.     Pulses: Normal pulses.     Heart sounds: Normal heart sounds.  Pulmonary:     Effort: Pulmonary effort is normal.     Breath sounds: Normal breath sounds.  Neurological:     General: No focal deficit present.     Mental Status: She is alert and oriented to person, place, and time.  Psychiatric:        Mood and Affect: Mood normal.           Assessment & Plan:  [name] KitchenMarland KitchenDani Johnston  was seen today for follow-up.  Diagnoses and all orders for this visit:  Severe obesity (BMI >= 40) ([place])  Controlled type 2 diabetes mellitus with complication, without long-term current use of insulin ([place])   Ok for bariatric surgery.   Spent 30 minutes reviewing chart and filling out paperwork for bariatric surgery with patients weight history.   Will discuss with cindy about neurology  referral.

## 2020-05-28 ENCOUNTER — Ambulatory Visit: Payer: 59 | Admitting: Physician Assistant

## 2020-05-28 ENCOUNTER — Other Ambulatory Visit: Payer: Self-pay | Admitting: Sports Medicine

## 2020-05-28 DIAGNOSIS — J111 Influenza due to unidentified influenza virus with other respiratory manifestations: Secondary | ICD-10-CM

## 2020-05-30 ENCOUNTER — Telehealth: Payer: Self-pay | Admitting: Physician Assistant

## 2020-05-30 ENCOUNTER — Encounter: Payer: Self-pay | Admitting: Rehabilitative and Restorative Service Providers"

## 2020-05-30 ENCOUNTER — Other Ambulatory Visit: Payer: Self-pay

## 2020-05-30 ENCOUNTER — Ambulatory Visit (INDEPENDENT_AMBULATORY_CARE_PROVIDER_SITE_OTHER): Payer: 59 | Admitting: Rehabilitative and Restorative Service Providers"

## 2020-05-30 DIAGNOSIS — R2681 Unsteadiness on feet: Secondary | ICD-10-CM | POA: Diagnosis not present

## 2020-05-30 DIAGNOSIS — R29818 Other symptoms and signs involving the nervous system: Secondary | ICD-10-CM | POA: Diagnosis not present

## 2020-05-30 DIAGNOSIS — R2689 Other abnormalities of gait and mobility: Secondary | ICD-10-CM
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
## 2020-05-30 NOTE — Telephone Encounter (Signed)
Form completed and faxed to [phone] with confirmation received.

## 2020-05-30 NOTE — Telephone Encounter (Signed)
Filled out my part if you could finish the rest.

## 2020-05-30 NOTE — Telephone Encounter (Signed)
Pt dropped off Bariatric Support Letter and I placed in [place] for her to fill out and call patient when ready for pick up. Thanks

## 2020-05-30 NOTE — Patient Instructions (Signed)
Access Code: [email or web address] URL: [email or web address] Date: 05/30/2020 Prepared by: Rudell Cobb  Program Notes *Couple walking with your outdoor animal chores.  *Add ball toss R and L hands for eye/hand coordination.   Exercises Romberg Stance with Eyes Closed - 2 x daily - 7 x weekly - 1 sets - 3 reps - 30 seconds hold Standing Gaze Stabilization with Head Rotation - 2 x daily - 7 x weekly - 1 sets - 10 reps Standing Horizontal Head Rotation Vestibular Habituation - 2 x daily - 7 x weekly - 1 sets - 5 reps Standing Head Nod Vestibular Habituation - 2 x daily - 7 x weekly - 1 sets - 5 reps Wide Stance with Eyes Closed on Foam Pad - 2 x daily - 7 x weekly - 1 sets - 10 reps

## 2020-06-02 ENCOUNTER — Encounter: Payer: Self-pay | Admitting: Physician Assistant

## 2020-06-02 ENCOUNTER — Encounter: Payer: Self-pay | Admitting: Neurology

## 2020-06-02 DIAGNOSIS — R2689 Other abnormalities of gait and mobility: Secondary | ICD-10-CM | POA: Insufficient documentation

## 2020-06-02 DIAGNOSIS — R2681 Unsteadiness on feet: Secondary | ICD-10-CM | POA: Insufficient documentation

## 2020-06-02 NOTE — Progress Notes (Signed)
Sent to [facility] Neurology

## 2020-06-03 ENCOUNTER — Encounter: Payer: Self-pay | Admitting: Physician Assistant

## 2020-06-03 NOTE — Progress Notes (Signed)
Negative for intraepithelial lesion or malignancy.  

## 2020-06-16 ENCOUNTER — Telehealth: Payer: Self-pay

## 2020-06-16 NOTE — Telephone Encounter (Signed)
PA sent for [facility], awaiting response.

## 2020-06-19 NOTE — Telephone Encounter (Signed)
PA denied. "Julie Johnston is an anti-obesity medication and is excluded from your plans formulary" Insurance also sent letter to the patient.

## 2020-06-28 ENCOUNTER — Other Ambulatory Visit: Payer: Self-pay | Admitting: Physician Assistant

## 2020-06-28 DIAGNOSIS — Z20822 Contact with and (suspected) exposure to covid-19: Secondary | ICD-10-CM

## 2020-06-28 DIAGNOSIS — R059 Cough, unspecified: Secondary | ICD-10-CM

## 2020-06-30 ENCOUNTER — Encounter: Payer: Self-pay | Admitting: Physician Assistant

## 2020-06-30 MED ORDER — FLUTICASONE PROPIONATE 50 MCG/ACT NA SUSP: 2.0000 | Freq: Every day | NASAL | 0 refills | Status: DC

## 2020-07-21 ENCOUNTER — Telehealth (INDEPENDENT_AMBULATORY_CARE_PROVIDER_SITE_OTHER): Payer: 59 | Admitting: Physician Assistant

## 2020-07-21 ENCOUNTER — Encounter: Payer: Self-pay | Admitting: Physician Assistant

## 2020-07-21 VITALS — BP 106/61 | Temp 98.8°F | Wt 282.0 lb

## 2020-07-21 DIAGNOSIS — F332 Major depressive disorder, recurrent severe without psychotic features: Secondary | ICD-10-CM

## 2020-07-21 DIAGNOSIS — E118 Type 2 diabetes mellitus with unspecified complications: Secondary | ICD-10-CM | POA: Diagnosis not present

## 2020-07-21 DIAGNOSIS — M797 Fibromyalgia: Secondary | ICD-10-CM

## 2020-07-21 DIAGNOSIS — R5382 Chronic fatigue, unspecified: Secondary | ICD-10-CM

## 2020-07-21 NOTE — Progress Notes (Signed)
..Virtual Visit via Video Note  I connected with Julie Johnston on 07/22/20 at  3:20 PM EDT by a video enabled telemedicine application and verified that I am speaking with the correct person using two identifiers.  Location: Patient: home Provider: clinic  .[name] KitchenParticipating in visit:  Patient: Julie Johnston Provider: Iran Planas PA-C   I discussed the limitations of evaluation and management by telemedicine and the availability of in person appointments. The patient expressed understanding and agreed to proceed.  History of Present Illness: Patient is a 50 year old obese female with hypertension, migraines, obstructive sleep apnea, controlled type 2 diabetes, ADHD, fibromyalgia, MDD, PTSD who presents to the clinic to discuss worsening fatigue.   Patient has always struggled with some form of fatigue but over the past 3 weeks it is much more severe.  She has started volunteering 1 or 2 times a week about 1 hour away.  Now she has to stop and nap in route.  She noticed this occur and worsen right after she had 2 vaccines on the same date on April 13 her second Shingrix and her COVID booster.  At times she naps 4 to 5 hours during the day.  She does admit she is not sleeping great at night. She is waking up in the middle of the night. Taking trazodone 100mg  nightly.  She is using the CPAP nightly.  She has not had her glucose checked in a while wonders about checking that.  She continues to work with psychiatry on her mood.  Overall she feels relatively good.  She does have the motivation to get out of the house a little into something.  No new medications, no fever, chills, headaches, cough, sinus pressure.   .. Active Ambulatory Problems    Diagnosis Date Noted  . Lyme disease 05/09/2015  . Insomnia 05/09/2015  . Palpitations 05/09/2015  . Chronic fatigue 05/09/2015  . No energy 05/09/2015  . Hyperlipidemia 05/09/2015  . Pap smear abnormality of cervix with LGSIL 02/07/2014  . Attention  deficit hyperactivity disorder (ADHD), predominantly inattentive type 05/09/2015  . Major depressive disorder   . Pre-diabetes 05/14/2015  . Obstructive sleep apnea 06/18/2015  . Urine ketones 09/22/2015  . Fibromyalgia 09/22/2015  . Obesity 09/22/2015  . Drug-induced weight gain 02/23/2016  . Migraine 11/29/2017  . Dysuria 11/29/2017  . Potential exposure to STD 12/16/2017  . Frequent infections 05/08/2018  . Decreased thyroid stimulating hormone (TSH) level 05/08/2018  . Equivocal stress echocardiogram 05/17/2018  . Influenza-like illness 07/20/2018  . Essential hypertension 06/04/2019  . Acute thoracic back pain 10/01/2019  . PTSD (post-traumatic stress disorder) 10/01/2019  . Right posterior sixth rib fracture 10/02/2019  . Generalized anxiety disorder 11/07/2019  . Mild traumatic brain injury 09/12/2019  . Pulmonary collapse 06/10/2014  . Polyp of transverse colon 02/25/2020  . Diverticulosis of colon 02/25/2020  . Internal hemorrhoids 02/25/2020  . Dizziness 05/19/2020  . Double vision 05/19/2020  . Vertigo 05/19/2020  . Controlled type 2 diabetes mellitus with complication, without long-term current use of insulin ([place]) 05/27/2020  . Severe obesity (BMI >= 40) ([place]) 05/27/2020  . Unsteadiness on feet 06/02/2020  . Other abnormalities of gait and mobility 06/02/2020   Resolved Ambulatory Problems    Diagnosis Date Noted  . Influenza B 12/16/2017  . Post concussion syndrome 10/01/2019   Past Medical History:  Diagnosis Date  . Obesity (BMI 35.0-39.9 without comorbidity)        Observations/Objective: No acute distress Normal breathing Normal mood and appearance.  Assessment and Plan: [name] KitchenMarland KitchenDiagnoses and all orders for this visit:  Chronic fatigue -     Hemoglobin A1c -     CBC with Differential/Platelet -     B12 and Folate Panel -     VITAMIN D 25 Hydroxy (Vit-D Deficiency, Fractures)  Severe obesity (BMI >= 40) ([facility])  Controlled type 2 diabetes  mellitus with complication, without long-term current use of insulin ([facility])  Fibromyalgia  Severe episode of recurrent major depressive disorder, without psychotic features ([place])   Ultimately this sounds like a worsening of her chronic fatigue. We will recheck some labs to make sure everything is in normal range. Patient is seeing psychiatry tomorrow and suggest them to look at her mood.  I do think her depression could be causing some of this. Certainly cannot rule out some postvaccine fatigue with getting to vaccines on the same day.  Hopefully this would run its course in the next few weeks. It is not good for her nighttime sleep tonight 4 to 5 hours during the day.  Continue to work on getting good nighttime rest.  Wear CPAP every night.  Can increase trazodone to 150 mg. Continue to try to walk for at least 30 minutes daily. Patient cannot take stimulants such as Provigil for chronic fatigue due to her EMSAM medication.  As you work on weight loss and fatigue usually improves.  Hopefully this will be the case with you.  Continue [id number].  Certainly follow-up as needed or if symptoms change.    Follow Up Instructions:    I discussed the assessment and treatment plan with the patient. The patient was provided an opportunity to ask questions and all were answered. The patient agreed with the plan and demonstrated an understanding of the instructions.   The patient was advised to call back or seek an in-person evaluation if the symptoms worsen or if the condition fails to improve as anticipated.  Spent 30 minutes on the video call discussing fatigue and nonmedication treatment options.   Iran Planas, PA-C

## 2020-07-22 ENCOUNTER — Telehealth: Payer: 59 | Admitting: Physician Assistant

## 2020-07-22 ENCOUNTER — Encounter: Payer: Self-pay | Admitting: Physician Assistant

## 2020-07-22 MED ORDER — BLOOD GLUCOSE METER KIT: PACK | 0 refills | Status: DC

## 2020-07-24 ENCOUNTER — Other Ambulatory Visit: Payer: Self-pay | Admitting: Physician Assistant

## 2020-07-24 ENCOUNTER — Encounter: Payer: Self-pay | Admitting: Physician Assistant

## 2020-07-24 LAB — CBC WITH DIFFERENTIAL/PLATELET
Absolute Monocytes: 391 cells/uL (ref 200–950)
Basophils Absolute: 46 cells/uL (ref 0–200)
Basophils Relative: 0.5 %
Eosinophils Absolute: 173 cells/uL (ref 15–500)
Eosinophils Relative: 1.9 %
HCT: 41.6 % (ref 35.0–45.0)
Hemoglobin: 13.8 g/dL (ref 11.7–15.5)
Lymphs Abs: 2812 cells/uL (ref 850–3900)
MCH: 29.2 pg (ref 27.0–33.0)
MCHC: 33.2 g/dL (ref 32.0–36.0)
MCV: 88.1 fL (ref 80.0–100.0)
MPV: 10.1 fL (ref 7.5–12.5)
Monocytes Relative: 4.3 %
Neutro Abs: 5678 cells/uL (ref 1500–7800)
Neutrophils Relative %: 62.4 %
Platelets: 337 10*3/uL (ref 140–400)
RBC: 4.72 10*6/uL (ref 3.80–5.10)
RDW: 13.6 % (ref 11.0–15.0)
Total Lymphocyte: 30.9 %
WBC: 9.1 10*3/uL (ref 3.8–10.8)

## 2020-07-24 LAB — HEMOGLOBIN A1C
Hgb A1c MFr Bld: 6 % of total Hgb — ABNORMAL HIGH (ref <5.7)
Mean Plasma Glucose: 126 mg/dL
eAG (mmol/L): 7 mmol/L

## 2020-07-24 LAB — B12 AND FOLATE PANEL
Folate: 18.5 ng/mL
Vitamin B-12: 633 pg/mL (ref 200–1100)

## 2020-07-24 LAB — VITAMIN D 25 HYDROXY (VIT D DEFICIENCY, FRACTURES): Vit D, 25-Hydroxy: 59 ng/mL (ref 30–100)

## 2020-07-24 NOTE — Progress Notes (Signed)
Anda Kraft,  B12 looks GREAT.  Vitamin D GREAT.  A1C improved at 6.0.   Labs look really good.

## 2020-07-25 MED ORDER — OZEMPIC (1 MG/DOSE) 4 MG/3ML ~~LOC~~ SOPN: 1.0000 mg | PEN_INJECTOR | SUBCUTANEOUS | 2 refills | Status: DC

## 2020-07-28 ENCOUNTER — Encounter: Payer: Self-pay | Admitting: Physician Assistant

## 2020-07-28 ENCOUNTER — Telehealth: Payer: 59 | Admitting: Physician Assistant

## 2020-07-28 DIAGNOSIS — S060X0A Concussion without loss of consciousness, initial encounter: Secondary | ICD-10-CM
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
## 2020-07-28 NOTE — Patient Instructions (Signed)
Concussion, Adult  A concussion is a brain injury from a hard, direct hit (trauma) to your head or body. This direct hit causes your brain to quickly shake back and forth inside your skull. A concussion may also be called a mild traumatic brain injury (TBI). Healing from this injury can take time. What are the causes? This condition is caused by:  A direct hit to your head, such as: ? Running into a player during a game. ? Being hit in a fight. ? Hitting your head on a hard surface.  A quick and sudden movement of the head or neck, such as in a car crash. What are the signs or symptoms? The signs of a concussion can be hard to notice. They may be missed by you, family members, and doctors. You may look fine on the outside but may not act or feel normal. Physical symptoms  Headaches.  Being dizzy.  Problems with body balance.  Being sensitive to light or noise.  Vomiting or feeling like you may vomit.  Being tired.  Problems seeing or hearing.  Not sleeping or eating as you used to.  Seizure. Mental and emotional symptoms  Feeling grouchy (irritable).  Having mood changes.  Problems remembering things.  Trouble focusing your mind (concentrating), organizing, or making decisions.  Being slow to think, act, react, speak, or read.  Feeling worried or nervous (anxious).  Feeling sad (depressed). How is this treated? This condition may be treated by:  Stopping sports or activity if you are injured. If you hit your head or have signs of concussion: ? Do not return to sports or activities the same day. ? Get checked by a doctor before you return to your activities.  Resting your body and your mind.  Being watched carefully, often at home.  Medicines to help with symptoms such as: ? Headaches. ? Feeling like you may vomit. ? Problems with sleep.  Avoiding alcohol and drugs.  Being asked to go to a concussion clinic or a place to help you recover  (rehabilitation center). Recovery from a concussion can take time. Return to activities only:  When you are fully healed.  When your doctor says it is safe. Avoid taking strong pain medicines (opioids) for a concussion. Follow these instructions at home: Activity  Limit activities that need a lot of thought or focus, such as: ? Homework or work for your job. ? Watching TV. ? Using the computer or phone. ? Playing memory games and puzzles.  Rest. Rest helps your brain heal. Make sure you: ? Get plenty of sleep. Most adults should get 7-9 hours of sleep each night. ? Rest during the day. Take naps or breaks when you feel tired.  Avoid activity like exercise until your doctor says its safe. Stop any activity that makes symptoms worse.  Do not do activities that could cause a second concussion, such as riding a bike or playing sports.  Ask your doctor when you can return to your normal activities, such as school, work, sports, and driving. Your ability to react may be slower. Do not do these activities if you are dizzy. General instructions  Take over-the-counter and prescription medicines only as told by your doctor.  Do not drink alcohol until your doctor says you can.  Watch your symptoms and tell other people to do the same. Other problems can occur after a concussion. Older adults have a higher risk of serious problems.  Tell your work Freight forwarder, teachers, Government social research officer, school  counselor, coach, or athletic trainer about your injury and symptoms. Tell them about what you can or cannot do.  Keep all follow-up visits as told by your doctor. This is important.   How is this prevented? It is very important that you do not get another brain injury. In rare cases, another injury can cause brain damage that will not go away, brain swelling, or death. The risk of this is greatest in the first 7-10 days after a head injury. To avoid injuries:  Stop activities that could lead to a second  concussion, such as contact sports, until your doctor says it is okay.  When you return to sports or activities: ? Do not crash into other players. This is how most concussions happen. ? Follow the rules. ? Respect other players. Do not engage in violent behavior while playing.  Get regular exercise. Do strength and balance training.  Wear a helmet that fits you well during sports, biking, or other activities.  Helmets can help protect you from serious skull and brain injuries, but they do not protect you from a concussion. Even when wearing a helmet, you should avoid being hit in the head. Contact a doctor if:  Your symptoms do not get better.  You have new symptoms.  You have another injury. Get help right away if:  You have bad headaches or your headaches get worse.  You feel weak or numb in any part of your body.  You feel mixed up (confused).  Your balance gets worse.  You vomit often.  You feel more sleepy than normal.  You cannot speak well, or have slurred speech.  You have a seizure.  Others have trouble waking you up.  You have changes in how you act.  You have changes in how you see (vision).  You pass out (lose consciousness). These symptoms may be an emergency. Do not wait to see if the symptoms will go away. Get medical help right away. Call your local emergency services (911 in the [place] Do not drive yourself to the hospital. Summary  A concussion is a brain injury from a hard, direct hit (trauma) to your head or body.  This condition is treated with rest and careful watching of symptoms.  Ask your doctor when you can return to your normal activities, such as school, work, or driving.  Get help right away if you have a very bad headache, feel weak in any part of your body, have a seizure, have changes in how you act or see, or if you are mixed up or more sleepy than normal. This information is not intended to replace advice given to you by your  health care provider. Make sure you discuss any questions you have with your health care provider. Document Revised: 01/18/2019 Document Reviewed: 01/18/2019 Elsevier Patient Education  [place].

## 2020-07-30 ENCOUNTER — Telehealth: Payer: Self-pay
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
## 2020-07-31 ENCOUNTER — Encounter: Payer: Self-pay | Admitting: Physician Assistant

## 2020-08-01 ENCOUNTER — Ambulatory Visit (INDEPENDENT_AMBULATORY_CARE_PROVIDER_SITE_OTHER): Payer: 59 | Admitting: Physician Assistant

## 2020-08-01 ENCOUNTER — Other Ambulatory Visit: Payer: Self-pay

## 2020-08-01 VITALS — BP 138/80 | HR 93

## 2020-08-01 DIAGNOSIS — R519 Headache, unspecified: Secondary | ICD-10-CM | POA: Diagnosis not present

## 2020-08-01 DIAGNOSIS — S0990XA Unspecified injury of head, initial encounter: Secondary | ICD-10-CM | POA: Insufficient documentation

## 2020-08-01 DIAGNOSIS — G44309 Post-traumatic headache, unspecified, not intractable: Secondary | ICD-10-CM

## 2020-08-01 DIAGNOSIS — S0990XD Unspecified injury of head, subsequent encounter: Secondary | ICD-10-CM

## 2020-08-01 DIAGNOSIS — F0781 Postconcussional syndrome: Secondary | ICD-10-CM

## 2020-08-01 DIAGNOSIS — F4310 Post-traumatic stress disorder, unspecified: Secondary | ICD-10-CM

## 2020-08-01 DIAGNOSIS — F332 Major depressive disorder, recurrent severe without psychotic features: Secondary | ICD-10-CM

## 2020-08-01 DIAGNOSIS — F900 Attention-deficit hyperactivity disorder, predominantly inattentive type: Secondary | ICD-10-CM

## 2020-08-01 MED ORDER — KETOROLAC TROMETHAMINE 60 MG/2ML IM SOLN
60.0000 mg | Freq: Once | INTRAMUSCULAR | Status: AC
  Administered 2020-08-01: 60 mg via INTRAMUSCULAR
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
## 2020-08-01 NOTE — Patient Instructions (Signed)
Post-Concussion Syndrome  A concussion is a brain injury from a direct hit to the head or body. This hit causes the brain to shake quickly back and forth inside the skull. This can damage brain cells and cause chemical changes in the brain. Concussions are usually not life-threatening but can cause serious symptoms. Post-concussion syndrome is when symptoms that occur after a concussion last longer than normal. These symptoms can last from weeks to months. What are the causes? The cause of this condition is not known. It can happen whether your head injury was mild or severe. What increases the risk? You are more likely to develop this condition if:  You are female.  You are a child, teen, or young adult.  You have had a past head injury.  You have a history of headaches.  You have depression or anxiety.  You have loss of consciousness or cannot remember the event (have amnesia of the event).  You have multiple symptoms or severe symptoms at the time of your concussion. What are the signs or symptoms? Symptoms of this condition include:  Physical symptoms. You may have: ? Headaches. ? Tiredness. ? Dizziness and weakness. ? Blurry vision and sensitivity to light. ? Hearing difficulties. ? Problems with balance.  Mental and emotional symptoms. You may have: ? Memory problems and trouble concentrating. ? Difficulty sleeping or staying asleep. ? Feelings of irritability. ? Anxiety or depression. ? Difficulty learning new things. How is this diagnosed? This condition may be diagnosed based on:  Your symptoms.  A description of your injury.  Your medical history.  Testing your strength, balance, and nerve function (neurological examination). Your health care provider may order other tests, including brain imaging such as a CT scan or an MRI, and memory testing (neuropsychological testing). How is this treated? Treatment for this condition may depend on your symptoms.  Symptoms usually go away on their own over time. Treatments may include:  Medicines for headaches, anxiety, depression, and trouble sleeping (insomnia).  Resting your brain and body for a few days after your injury.  Rehabilitation therapy, such as: ? Physical or occupational therapy. This may include exercises to help with balance and dizziness. ? Mental health counseling. A form of talk therapy called cognitive behavioral therapy (CBT) can be especially helpful. This therapy helps you set goals and follow up on the changes that you make. ? Speech therapy. ? Vision therapy. A brain and eye specialist can recommend treatments for vision problems. Follow these instructions at home: Medicines  Take over-the-counter and prescription medicines only as told by your health care provider.  Avoid opioid prescription pain medicines when recovering from a concussion. Activity  Limit your mental activities for the first few days after your injury. This may include not doing the following: ? Homework or job-related work. ? Complex thinking. ? Watching TV, and using a computer or phone. ? Playing memory games and puzzles.  Gradually return to your normal activity level. If a certain activity brings on your symptoms, stop or slow down until you can do the activity without it triggering your symptoms.  Limit physical activity, such as exercise or sports, for the first few days after a concussion. Gradually return to normal activity as told by your health care provider.  Rest. Rest helps your brain heal. Make sure you: ? Get plenty of sleep at night. Most adults should get at least 7-9 hours of sleep each night. ? Rest during the day. Take naps or rest breaks when   you feel tired.  Do not do high-risk activities that could cause a second concussion, such as riding a bike or playing sports. Having another concussion before the first one has healed can be dangerous. General instructions  Do not  drink alcohol until your health care provider says that you can.  Keep track of the frequency and the severity of your symptoms. Give this information to your health care provider.  Keep all follow-up visits as directed by your health care provider. This is important. This includes visits with specialists.   Contact a health care provider if:  Your symptoms do not improve.  You have another injury. Get help right away if you:  Have a severe or worsening headache.  Are confused.  Have trouble staying awake.  Faint.  Vomit.  Have weakness or numbness in any part of your body.  Have a seizure.  Have trouble speaking. Summary  Post-concussion syndrome is when symptoms that occur after a concussion last longer than normal.  Symptoms usually go away on their own over time. Depending on your symptoms, you may need treatment, such as medicines or rehabilitation therapy.  Rest your brain and body for a few days after your injury. Gradually return to normal activities as told by your health care provider.  Get plenty of sleep, and avoid alcohol and opioid pain medicines while recovering from a concussion. This information is not intended to replace advice given to you by your health care provider. Make sure you discuss any questions you have with your health care provider. Document Revised: 02/28/2019 Document Reviewed: 02/28/2019 Elsevier Patient Education  2021 [employer]vier Inc.  

## 2020-08-01 NOTE — Telephone Encounter (Signed)
Patient scheduled.

## 2020-08-01 NOTE — Progress Notes (Signed)
   Subjective:    Patient ID: Julie Johnston, female    DOB: Apr 18, 1970, 50 y.o.   MRN: [id number]  HPI    Review of Systems     Objective:   Physical Exam        Assessment & Plan:

## 2020-08-04 ENCOUNTER — Encounter: Payer: Self-pay | Admitting: Physician Assistant

## 2020-08-04 DIAGNOSIS — G44309 Post-traumatic headache, unspecified, not intractable: Secondary | ICD-10-CM | POA: Insufficient documentation
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
## 2020-08-05 ENCOUNTER — Ambulatory Visit (INDEPENDENT_AMBULATORY_CARE_PROVIDER_SITE_OTHER): Payer: 59 | Admitting: Neurology

## 2020-08-05 ENCOUNTER — Encounter: Payer: Self-pay | Admitting: Neurology

## 2020-08-05 ENCOUNTER — Other Ambulatory Visit: Payer: Self-pay

## 2020-08-05 VITALS — BP 133/58 | HR 98 | Ht 65.0 in | Wt 290.0 lb

## 2020-08-05 DIAGNOSIS — G43709 Chronic migraine without aura, not intractable, without status migrainosus: Secondary | ICD-10-CM | POA: Diagnosis not present

## 2020-08-05 DIAGNOSIS — R42 Dizziness and giddiness: Secondary | ICD-10-CM

## 2020-08-05 DIAGNOSIS — F0781 Postconcussional syndrome: Secondary | ICD-10-CM | POA: Diagnosis not present

## 2020-08-05 DIAGNOSIS — F332 Major depressive disorder, recurrent severe without psychotic features: Secondary | ICD-10-CM

## 2020-08-05 MED ORDER — QULIPTA 60 MG PO TABS: 60.0000 mg | ORAL_TABLET | Freq: Every day | ORAL | 5 refills | Status: DC

## 2020-08-05 MED ORDER — CELECOXIB 200 MG PO CAPS: ORAL_CAPSULE | ORAL | 2 refills | Status: DC

## 2020-08-05 NOTE — Patient Instructions (Signed)
1.  Start Qulipta 60mg  daily 2.  Stop ibuprofen 3.  Use rizatriptan as directed 4.  Limit use of pain relievers to no more than 2 days out of week to prevent risk of rebound or medication-overuse headache. 5.  Will check MRI of brain without contrast 6.  Refer to vestibular rehab 7.  Follow up in 6 months.

## 2020-08-12 ENCOUNTER — Other Ambulatory Visit: Payer: Self-pay | Admitting: Physician Assistant

## 2020-08-13 ENCOUNTER — Ambulatory Visit (INDEPENDENT_AMBULATORY_CARE_PROVIDER_SITE_OTHER): Payer: 59 | Admitting: Physician Assistant

## 2020-08-13 ENCOUNTER — Other Ambulatory Visit: Payer: Self-pay | Admitting: Physician Assistant

## 2020-08-13 ENCOUNTER — Encounter: Payer: Self-pay | Admitting: Physician Assistant

## 2020-08-13 ENCOUNTER — Other Ambulatory Visit: Payer: Self-pay

## 2020-08-13 VITALS — BP 123/62 | HR 88 | Temp 97.8°F | Ht 65.0 in | Wt 285.0 lb

## 2020-08-13 DIAGNOSIS — Z20822 Contact with and (suspected) exposure to covid-19: Secondary | ICD-10-CM

## 2020-08-13 DIAGNOSIS — H938X1 Other specified disorders of right ear: Secondary | ICD-10-CM | POA: Diagnosis not present

## 2020-08-13 DIAGNOSIS — R609 Edema, unspecified: Secondary | ICD-10-CM | POA: Insufficient documentation

## 2020-08-13 DIAGNOSIS — R059 Cough, unspecified: Secondary | ICD-10-CM

## 2020-08-13 DIAGNOSIS — K1120 Sialoadenitis, unspecified: Secondary | ICD-10-CM

## 2020-08-13 MED ORDER — KETOROLAC TROMETHAMINE 60 MG/2ML IM SOLN
60.0000 mg | Freq: Once | INTRAMUSCULAR | Status: AC
  Administered 2020-08-13: 60 mg via INTRAMUSCULAR

## 2020-08-13 MED ORDER — AMOXICILLIN-POT CLAVULANATE 875-125 MG PO TABS: 1.0000 | ORAL_TABLET | Freq: Two times a day (BID) | ORAL | 0 refills | Status: AC

## 2020-08-13 NOTE — Progress Notes (Signed)
Subjective:    Patient ID: Julie Johnston, female    DOB: 10/31/1970, 50 y.o.   MRN: [id number]  HPI  Pt is a 50 yo obese female with Migraines, HTN, ADHD, Fibromyalgia, and recent TBI with complications who presents to the clinic with right ear and parotid tenderness, fullness and swelling.   Pt denies any fever, chills, N/V/D, cough, ST, SOB difficultly breathing. Her right ear feels very full and that has been ongoing since TBI. New is the parotid tenderness and swelling. Cannot take oral NSAIDs due to upcoming gastric sleeve surgery.   .. Active Ambulatory Problems    Diagnosis Date Noted  . Lyme disease 05/09/2015  . Insomnia 05/09/2015  . Palpitations 05/09/2015  . Chronic fatigue 05/09/2015  . No energy 05/09/2015  . Hyperlipidemia 05/09/2015  . Pap smear abnormality of cervix with LGSIL 02/07/2014  . Attention deficit hyperactivity disorder (ADHD), predominantly inattentive type 05/09/2015  . Major depressive disorder   . Pre-diabetes 05/14/2015  . Obstructive sleep apnea 06/18/2015  . Urine ketones 09/22/2015  . Fibromyalgia 09/22/2015  . Obesity 09/22/2015  . Drug-induced weight gain 02/23/2016  . Migraine 11/29/2017  . Dysuria 11/29/2017  . Potential exposure to STD 12/16/2017  . Frequent infections 05/08/2018  . Decreased thyroid stimulating hormone (TSH) level 05/08/2018  . Equivocal stress echocardiogram 05/17/2018  . Influenza-like illness 07/20/2018  . Essential hypertension 06/04/2019  . Post concussion syndrome 10/01/2019  . Acute thoracic back pain 10/01/2019  . PTSD (post-traumatic stress disorder) 10/01/2019  . Right posterior sixth rib fracture 10/02/2019  . Generalized anxiety disorder 11/07/2019  . Mild traumatic brain injury 09/12/2019  . Pulmonary collapse 06/10/2014  . Polyp of transverse colon 02/25/2020  . Diverticulosis of colon 02/25/2020  . Internal hemorrhoids 02/25/2020  . Dizziness 05/19/2020  . Double vision 05/19/2020  . Vertigo  05/19/2020  . Controlled type 2 diabetes mellitus with complication, without long-term current use of insulin ([place]) 05/27/2020  . Severe obesity (BMI >= 40) ([place]) 05/27/2020  . Unsteadiness on feet 06/02/2020  . Other abnormalities of gait and mobility 06/02/2020  . Acute intractable headache 08/01/2020  . Head injury 08/01/2020  . Post-concussion headache 08/04/2020  . Parotid swelling 08/13/2020  . Ear fullness, right 08/14/2020  . Parotiditis 08/14/2020   Resolved Ambulatory Problems    Diagnosis Date Noted  . Influenza B 12/16/2017   Past Medical History:  Diagnosis Date  . Obesity (BMI 35.0-39.9 without comorbidity)       Review of Systems See HPI.     Objective:   Physical Exam Vitals reviewed.  Constitutional:      Appearance: Normal appearance. She is obese.  HENT:     Head: Normocephalic.     Right Ear: Tympanic membrane, ear canal and external ear normal. There is no impacted cerumen.     Left Ear: Tympanic membrane, ear canal and external ear normal. There is no impacted cerumen.     Nose: Congestion present.     Mouth/Throat:     Mouth: Mucous membranes are moist.     Pharynx: No oropharyngeal exudate or posterior oropharyngeal erythema.  Eyes:     General:        Right eye: No discharge.        Left eye: No discharge.     Extraocular Movements: Extraocular movements intact.     Conjunctiva/sclera: Conjunctivae normal.     Pupils: Pupils are equal, round, and reactive to light.  Neck:     Comments: Right tender  and slightly swollen parotid gland. No masses palpated.  Cardiovascular:     Rate and Rhythm: Normal rate and regular rhythm.     Pulses: Normal pulses.  Pulmonary:     Effort: Pulmonary effort is normal.     Breath sounds: Normal breath sounds.  Lymphadenopathy:     Cervical: No cervical adenopathy.  Skin:    Findings: No rash.  Neurological:     General: No focal deficit present.     Mental Status: She is alert and oriented to person,  place, and time.  Psychiatric:        Mood and Affect: Mood normal.           Assessment & Plan:  [name] KitchenMarland KitchenMalori was seen today for ear problem.  Diagnoses and all orders for this visit:  Parotiditis -     ketorolac (TORADOL) injection 60 mg -     amoxicillin-clavulanate (AUGMENTIN) 875-125 MG tablet; Take 1 tablet by mouth 2 (two) times daily for 10 days.  Parotid swelling -     amoxicillin-clavulanate (AUGMENTIN) 875-125 MG tablet; Take 1 tablet by mouth 2 (two) times daily for 10 days.  Ear fullness, right   No red flags on PE.  Pt cannot take any steroids or NSAIDs due to upcoming surgery.  toradol given in office today.  Consider ice.  If parotid swelling, redness, pain got worse add augmentin.  For ear fullness that is likely more ETD( no infection seen) start flonase for 5 days and afrin for 2 days and alternate.  Follow up with any new or concerning features.

## 2020-08-13 NOTE — Patient Instructions (Addendum)
flonase 5 days Afrin 2 day alternate.  augmentin only if you need it and start having worsening swelling, pain, fever.   Parotitis  Parotitis means that you have irritation and swelling (inflammation) in one or both of your parotid glands. These glands make saliva. They are found on each side of your face, below and in front of your earlobes. You may or may not have pain with this condition. What are the causes? This condition may be caused by:  Infections from germs (bacteria or viruses).  Something blocking the flow of saliva through the parotid glands. This can be a stone, scar tissue, or a tumor.  Diseases that cause your body's defense system (immune system) to attack healthy cells in your salivary glands. These are called autoimmune diseases. What increases the risk? You are more likely to get this condition if:  You are 51 years old or older.  You do not drink enough fluids (are dehydrated).  You drink too much alcohol.  You have: ? A dry mouth. ? Diabetes. ? Gout. ? A long-term illness.  You do not take good care of your mouth and teeth (poor dental hygiene).  You have had radiation treatments to the head and neck.  You take certain medicines. What are the signs or symptoms? Symptoms of this condition depend on the cause. They may include:  Swelling under and in front of the ear. This may get worse after you eat.  Redness of the skin over the parotid gland.  Pain and tenderness over the parotid gland. This may get worse after you eat.  Fever or chills.  Pus coming from the ducts inside the mouth.  Dry mouth.  A bad taste in the mouth. How is this treated? Treatment for this condition depends on the cause. Treatment may include:  Antibiotic medicine for an infection from bacteria.  Drinking more fluids.  Removing a stone or obstruction.  Treating a disease that is causing parotitis.  Surgery to drain an infection, remove a growth, or remove the  whole gland. Treatment may not be needed if the swelling goes away with home care. Follow these instructions at home: Medicines  Take over-the-counter and prescription medicines only as told by your doctor.  If you were prescribed an antibiotic medicine, take it as told by your doctor. Do not stop taking the antibiotic even if you start to feel better.   Managing pain and swelling  If told, put heat on the affected area. Do this as often as told by your doctor. Use the heat source that your doctor recommends, such as a moist heat pack or a heating pad. ? Place a towel between your skin and the heat source. ? Leave the heat on for 20-30 minutes. ? Remove the heat if your skin turns bright red. This is very important if you are unable to feel pain, heat, or cold. You may have a greater risk of getting burned.  Gargle with salt water 3-4 times a day or as needed. To make salt water, dissolve -1 tsp (3-6 g) of salt in 1 cup (237 mL) of warm water.  Gently rub your parotid glands as told by your doctor. General instructions  Drink enough fluid to keep your pee (urine) pale yellow.  Keep your mouth clean and moist.  Suck on sour candy. This may help to: ? Make your mouth less dry. ? Make more saliva.  Take good care of your mouth: ? Brush your teeth at least two times  a day. ? Floss your teeth every day. ? See your dentist regularly.  Do not use any products that contain nicotine or tobacco. These include cigarettes, e-cigarettes, and chewing tobacco. If you need help quitting, ask your doctor.  Do not drink alcohol.  Keep all follow-up visits as told by your doctor. This is important.   Contact a doctor if:  You have a fever or chills.  You have new symptoms.  Your symptoms get worse.  Your symptoms do not get better with treatment. Get help right away if:  You have trouble breathing or swallowing. Summary  Parotitis means that you have irritation and swelling  (inflammation) in one or both of your parotid glands.  Symptoms include pain and swelling under and in front of the ear.  Treatment for parotitis depends on the cause. In some cases, the condition may go away on its own with home care.  You should drink plenty of fluids, take good care of your mouth, and avoid tobacco products. This information is not intended to replace advice given to you by your health care provider. Make sure you discuss any questions you have with your health care provider. Document Revised: 10/04/2017 Document Reviewed: 10/04/2017 Elsevier Patient Education  [employer].

## 2020-08-14 DIAGNOSIS — K1120 Sialoadenitis, unspecified: Secondary | ICD-10-CM | POA: Insufficient documentation

## 2020-08-14 DIAGNOSIS — H938X1 Other specified disorders of right ear: Secondary | ICD-10-CM | POA: Insufficient documentation

## 2020-08-17 ENCOUNTER — Other Ambulatory Visit: Payer: Self-pay | Admitting: Sports Medicine

## 2020-08-17 ENCOUNTER — Other Ambulatory Visit: Payer: Self-pay | Admitting: Physician Assistant

## 2020-08-17 DIAGNOSIS — J111 Influenza due to unidentified influenza virus with other respiratory manifestations: Secondary | ICD-10-CM

## 2020-08-17 DIAGNOSIS — I10 Essential (primary) hypertension: Secondary | ICD-10-CM

## 2020-08-19 ENCOUNTER — Encounter: Payer: Self-pay | Admitting: Physician Assistant

## 2020-08-21 ENCOUNTER — Ambulatory Visit
Admission: RE | Admit: 2020-08-21 | Discharge: 2020-08-21 | Disposition: A | Discharge status: Home / Self Care | Payer: 59 | Source: Ambulatory Visit | Attending: Neurology | Admitting: Neurology

## 2020-08-21 ENCOUNTER — Other Ambulatory Visit: Payer: Self-pay

## 2020-08-21 ENCOUNTER — Other Ambulatory Visit: Payer: Self-pay | Admitting: Physician Assistant

## 2020-08-21 DIAGNOSIS — G43709 Chronic migraine without aura, not intractable, without status migrainosus: Secondary | ICD-10-CM

## 2020-08-21 DIAGNOSIS — F0781 Postconcussional syndrome: Secondary | ICD-10-CM

## 2020-08-21 DIAGNOSIS — F332 Major depressive disorder, recurrent severe without psychotic features: Secondary | ICD-10-CM

## 2020-08-22 ENCOUNTER — Encounter: Payer: Self-pay | Admitting: Physician Assistant

## 2020-08-25 HISTORY — PX: LAPAROSCOPIC GASTRIC BYPASS: SUR771

## 2020-08-27 ENCOUNTER — Other Ambulatory Visit: Payer: Self-pay | Admitting: Physician Assistant

## 2020-08-27 DIAGNOSIS — S069X0S Unspecified intracranial injury without loss of consciousness, sequela: Secondary | ICD-10-CM

## 2020-08-27 DIAGNOSIS — R42 Dizziness and giddiness: Secondary | ICD-10-CM

## 2020-08-27 DIAGNOSIS — H938X1 Other specified disorders of right ear: Secondary | ICD-10-CM

## 2020-08-29 DIAGNOSIS — R112 Nausea with vomiting, unspecified: Secondary | ICD-10-CM | POA: Insufficient documentation

## 2020-09-15 ENCOUNTER — Other Ambulatory Visit: Payer: Self-pay | Admitting: Physician Assistant

## 2020-09-19 ENCOUNTER — Ambulatory Visit (INDEPENDENT_AMBULATORY_CARE_PROVIDER_SITE_OTHER): Payer: 59 | Admitting: Physician Assistant

## 2020-09-19 ENCOUNTER — Other Ambulatory Visit: Payer: Self-pay

## 2020-09-19 ENCOUNTER — Encounter: Payer: Self-pay | Admitting: Physician Assistant

## 2020-09-19 VITALS — BP 117/68 | HR 93 | Wt 260.0 lb

## 2020-09-19 DIAGNOSIS — M795 Residual foreign body in soft tissue: Secondary | ICD-10-CM

## 2020-09-19 MED ORDER — HYDROCODONE-ACETAMINOPHEN 5-325 MG PO TABS: 1.0000 | ORAL_TABLET | Freq: Three times a day (TID) | ORAL | 0 refills | Status: AC | PRN

## 2020-09-19 MED ORDER — HYDROCODONE-ACETAMINOPHEN 5-325 MG PO TABS: 1.0000 | ORAL_TABLET | Freq: Three times a day (TID) | ORAL | 0 refills | Status: DC | PRN

## 2020-09-19 MED ORDER — CEPHALEXIN 500 MG PO CAPS: 500.0000 mg | ORAL_CAPSULE | Freq: Two times a day (BID) | ORAL | 0 refills | Status: DC

## 2020-09-19 NOTE — Progress Notes (Signed)
   Subjective:    Patient ID: Julie Johnston, female    DOB: Apr 24, 1970, 50 y.o.   MRN: [id number]  HPI Foreign Body Patient complains of foreign body in mouth from her lip ring disc base being lodged into her upper lip. It was first noticed 2 days ago. She went back to her piercing location but they were not able to get it out. Symptoms: pain, pressure and swelling. Attempts at  home to remove it by pulling out with tweezers and tugging have failed. She presents here to have it removed.   Review of Systems See HPI.     Objective:   Physical Exam  Swollen red and tender upper lip with lip ring in place but back disc and rod not able to be located but able to be palpated.   Lip cleaned with hibclens 1 percent lidocaine without epi administered Simple manipulation was not able to unlodge lip disc.  Small lance to open already establish piercing hole.  Disc pulled out with hemostats and held onto while lip ring screwed off.     Assessment & Plan:  [name] KitchenMarland KitchenShirlie was seen today for follow-up.  Diagnoses and all orders for this visit:  Foreign body (FB) in soft tissue -     cephALEXin (KEFLEX) 500 MG capsule; Take 1 capsule (500 mg total) by mouth 2 (two) times daily. -     HYDROcodone-acetaminophen (NORCO/VICODIN) 5-325 MG tablet; Take 1 tablet by mouth every 8 (eight) hours as needed for up to 5 days for moderate pain.  Other orders -     Discontinue: HYDROcodone-acetaminophen (NORCO/VICODIN) 5-325 MG tablet; Take 1 tablet by mouth every 8 (eight) hours as needed for up to 5 days for moderate pain.   Lip ring removed today.  Rinse with warm salt water.  Just a few tablets of norco given for pain.  Ice upper lip.  Discussed signs of infection and to start keflex over the weekend if needed.

## 2020-09-23 ENCOUNTER — Encounter: Payer: Self-pay | Admitting: Physician Assistant

## 2020-10-11 ENCOUNTER — Other Ambulatory Visit: Payer: Self-pay | Admitting: Physician Assistant

## 2020-10-15 ENCOUNTER — Encounter: Payer: Self-pay | Admitting: Physician Assistant

## 2020-10-15 DIAGNOSIS — D229 Melanocytic nevi, unspecified: Secondary | ICD-10-CM

## 2020-10-19 ENCOUNTER — Other Ambulatory Visit: Payer: Self-pay | Admitting: Physician Assistant

## 2020-10-21 ENCOUNTER — Encounter: Payer: Self-pay | Admitting: Physician Assistant

## 2020-10-29 ENCOUNTER — Encounter: Payer: Self-pay | Admitting: Physician Assistant

## 2020-11-07 ENCOUNTER — Other Ambulatory Visit: Payer: Self-pay | Admitting: Physician Assistant

## 2020-11-17 ENCOUNTER — Telehealth: Payer: Self-pay | Admitting: Neurology

## 2020-11-17 NOTE — Telephone Encounter (Signed)
Patient has appt in NOV with Jaffe for a follow up. She sent a MyChart message abotu what is going on with her. She states that she is recovering from a TBI and the last couple of weeks she is having nerve pain all of her body    Please call

## 2020-11-17 NOTE — Telephone Encounter (Signed)
I really have no recommendation since this is a new issue.  It is not related to the TBI.  However, she saw another neurologist just a couple of weeks ago for nerve pain issues so she may want to try contacting him.   Per pt she was seen by another neurologist was for something else and the pain started after the visit.    Advised pt we can add her to the wait list.

## 2020-11-18 ENCOUNTER — Encounter: Payer: Self-pay | Admitting: Physician Assistant

## 2020-11-18 ENCOUNTER — Ambulatory Visit (INDEPENDENT_AMBULATORY_CARE_PROVIDER_SITE_OTHER): Payer: 59 | Admitting: Physician Assistant

## 2020-11-18 ENCOUNTER — Other Ambulatory Visit: Payer: Self-pay

## 2020-11-18 VITALS — BP 109/63 | HR 73 | Ht 65.0 in | Wt 231.0 lb

## 2020-11-18 DIAGNOSIS — Z23 Encounter for immunization: Secondary | ICD-10-CM

## 2020-11-18 DIAGNOSIS — Z9884 Bariatric surgery status: Secondary | ICD-10-CM | POA: Insufficient documentation

## 2020-11-18 DIAGNOSIS — R202 Paresthesia of skin: Secondary | ICD-10-CM

## 2020-11-18 MED ORDER — ROPINIROLE HCL 0.25 MG PO TABS: ORAL_TABLET | ORAL | 0 refills | Status: DC

## 2020-11-18 MED ORDER — GABAPENTIN 800 MG PO TABS: 800.0000 mg | ORAL_TABLET | Freq: Three times a day (TID) | ORAL | 0 refills | Status: DC

## 2020-11-18 NOTE — Progress Notes (Signed)
Subjective:    Patient ID: Julie Johnston, female    DOB: 1971-02-27, 50 y.o.   MRN: [id number]  HPI Pt is a 50 yo female with HTN, T2DM, ADHD, MDD, Anxiety, PTSD who presents to the clinic with new onset numbness and tingling all over body.   She noticed the first episode about 2 weeks ago after performing for a concert.  She got home and she felt like her whole body was on fire.  She had to take all of her clothes off.  Now intermittently she feels very similar symptoms.  Sometimes is like her skin is crawling.  She also has some random joint aches as well. She remembers feeling a little bit like this when she suddenly came off her gabapentin.  This has not happened.  She has been on the same dose of gabapentin. No strength changes. Symptoms not constant and seem to happen more at night. Concerned about it being caused by her TBI months ago.  .. Active Ambulatory Problems    Diagnosis Date Noted   Lyme disease 05/09/2015   Insomnia 05/09/2015   Palpitations 05/09/2015   Chronic fatigue 05/09/2015   No energy 05/09/2015   Hyperlipidemia 05/09/2015   Pap smear abnormality of cervix with LGSIL 02/07/2014   Attention deficit hyperactivity disorder (ADHD), predominantly inattentive type 05/09/2015   Major depressive disorder    Pre-diabetes 05/14/2015   Obstructive sleep apnea 06/18/2015   Urine ketones 09/22/2015   Fibromyalgia 09/22/2015   Obesity 09/22/2015   Drug-induced weight gain 02/23/2016   Migraine 11/29/2017   Dysuria 11/29/2017   Potential exposure to STD 12/16/2017   Frequent infections 05/08/2018   Decreased thyroid stimulating hormone (TSH) level 05/08/2018   Equivocal stress echocardiogram 05/17/2018   Influenza-like illness 07/20/2018   Essential hypertension 06/04/2019   Post concussion syndrome 10/01/2019   Acute thoracic back pain 10/01/2019   PTSD (post-traumatic stress disorder) 10/01/2019   Right posterior sixth rib fracture 10/02/2019   Generalized  anxiety disorder 11/07/2019   Mild traumatic brain injury 09/12/2019   Pulmonary collapse 06/10/2014   Polyp of transverse colon 02/25/2020   Diverticulosis of colon 02/25/2020   Internal hemorrhoids 02/25/2020   Dizziness 05/19/2020   Double vision 05/19/2020   Vertigo 05/19/2020   Controlled type 2 diabetes mellitus with complication, without long-term current use of insulin ([place]) 05/27/2020   Severe obesity (BMI >= 40) ([place]) 05/27/2020   Unsteadiness on feet 06/02/2020   Other abnormalities of gait and mobility 06/02/2020   Acute intractable headache 08/01/2020   Head injury 08/01/2020   Post-concussion headache 08/04/2020   Parotid swelling 08/13/2020   Ear fullness, right 08/14/2020   Parotiditis 08/14/2020   Foreign body (FB) in soft tissue 09/19/2020   S/P laparoscopic sleeve gastrectomy 11/18/2020   Paresthesia 11/18/2020   Resolved Ambulatory Problems    Diagnosis Date Noted   Influenza B 12/16/2017   Past Medical History:  Diagnosis Date   Obesity (BMI 35.0-39.9 without comorbidity)        Review of Systems See HPI.     Objective:   Physical Exam Vitals reviewed.  Constitutional:      Appearance: Normal appearance. She is obese.  HENT:     Head: Normocephalic.  Cardiovascular:     Rate and Rhythm: Normal rate and regular rhythm.     Pulses: Normal pulses.  Pulmonary:     Effort: Pulmonary effort is normal.  Musculoskeletal:        General: No swelling or tenderness.  Cervical back: Normal range of motion.     Right lower leg: No edema.     Left lower leg: No edema.     Comments: Normal ROM of upper and lower extremities.  5/5 strength of upper and lower extremities.   Neurological:     General: No focal deficit present.     Mental Status: She is alert and oriented to person, place, and time.  Psychiatric:        Mood and Affect: Mood normal.          Assessment & Plan:  [name] KitchenMarland KitchenHasel was seen today for pain.  Diagnoses and all orders  for this visit:  Paresthesias -     gabapentin (NEURONTIN) 800 MG tablet; Take 1 tablet (800 mg total) by mouth 3 (three) times daily. -     rOPINIRole (REQUIP) 0.25 MG tablet; Take 1-2 tablets as needed up to three times a day. -     COMPLETE METABOLIC PANEL WITH GFR -     CBC with Differential/Platelet -     Fe+TIBC+Fer -     B12 and Folate Panel -     Zinc -     VITAMIN D 25 Hydroxy (Vit-D Deficiency, Fractures) -     Magnesium  Flu vaccine need -     Flu Vaccine QUAD 60moIM (Fluarix, Fluzone & Alfiuria Quad PF)  S/P laparoscopic sleeve gastrectomy -     COMPLETE METABOLIC PANEL WITH GFR -     CBC with Differential/Platelet -     Fe+TIBC+Fer -     B12 and Folate Panel -     Zinc -     VITAMIN D 25 Hydroxy (Vit-D Deficiency, Fractures) -     Magnesium  Unclear etiology of symptoms. She has lost 30lbs fairly fast after gastric sleeve.  Will check her vitamins and labs.  Hx of anxiety. Discussed form of panic attacks.  No new medication changes.  Increased gabapentin to '800mg'$  TID.  Added requip for a RLS treatment.  Follow up in 1 month.

## 2020-11-19 ENCOUNTER — Encounter: Payer: Self-pay | Admitting: Physician Assistant

## 2020-11-19 ENCOUNTER — Other Ambulatory Visit: Payer: Self-pay | Admitting: Physician Assistant

## 2020-11-19 DIAGNOSIS — R748 Abnormal levels of other serum enzymes: Secondary | ICD-10-CM

## 2020-11-19 NOTE — Progress Notes (Signed)
Anda Kraft,   Serum iron normal range.  Iron stores above 40 and look ok.  Normal hemoglobin. B12 looks great.  Vitamin D looks great.   Your liver enzymes have JUMPED way up.  Stop taking any tylenol products. Stop drinking any alcohol.  JJ I printed all the labs I would like added pt may need to come back to have more labs drawn.  U/s of liver also ordered.

## 2020-11-20 ENCOUNTER — Other Ambulatory Visit: Payer: Self-pay

## 2020-11-20 ENCOUNTER — Ambulatory Visit (INDEPENDENT_AMBULATORY_CARE_PROVIDER_SITE_OTHER): Payer: 59

## 2020-11-20 DIAGNOSIS — R7989 Other specified abnormal findings of blood chemistry: Secondary | ICD-10-CM

## 2020-11-20 DIAGNOSIS — R748 Abnormal levels of other serum enzymes: Secondary | ICD-10-CM

## 2020-11-20 LAB — CBC WITH DIFFERENTIAL/PLATELET
Absolute Monocytes: 329 cells/uL (ref 200–950)
Basophils Absolute: 22 cells/uL (ref 0–200)
Basophils Relative: 0.3 %
Eosinophils Absolute: 190 cells/uL (ref 15–500)
Eosinophils Relative: 2.6 %
HCT: 43 % (ref 35.0–45.0)
Hemoglobin: 13.5 g/dL (ref 11.7–15.5)
Lymphs Abs: 1628 cells/uL (ref 850–3900)
MCH: 28.6 pg (ref 27.0–33.0)
MCHC: 31.4 g/dL — ABNORMAL LOW (ref 32.0–36.0)
MCV: 91.1 fL (ref 80.0–100.0)
MPV: 11.6 fL (ref 7.5–12.5)
Monocytes Relative: 4.5 %
Neutro Abs: 5132 cells/uL (ref 1500–7800)
Neutrophils Relative %: 70.3 %
Platelets: 264 10*3/uL (ref 140–400)
RBC: 4.72 10*6/uL (ref 3.80–5.10)
RDW: 13.8 % (ref 11.0–15.0)
Total Lymphocyte: 22.3 %
WBC: 7.3 10*3/uL (ref 3.8–10.8)

## 2020-11-20 LAB — COMPLETE METABOLIC PANEL WITH GFR
AG Ratio: 2 (calc) (ref 1.0–2.5)
ALT: 199 U/L — ABNORMAL HIGH (ref 6–29)
AST: 204 U/L — ABNORMAL HIGH (ref 10–35)
Albumin: 4.5 g/dL (ref 3.6–5.1)
Alkaline phosphatase (APISO): 171 U/L — ABNORMAL HIGH (ref 37–153)
BUN: 16 mg/dL (ref 7–25)
CO2: 24 mmol/L (ref 20–32)
Calcium: 9.8 mg/dL (ref 8.6–10.4)
Chloride: 108 mmol/L (ref 98–110)
Creat: 0.96 mg/dL (ref 0.50–1.03)
Globulin: 2.3 g/dL (calc) (ref 1.9–3.7)
Glucose, Bld: 106 mg/dL — ABNORMAL HIGH (ref 65–99)
Potassium: 4.1 mmol/L (ref 3.5–5.3)
Sodium: 142 mmol/L (ref 135–146)
Total Bilirubin: 0.7 mg/dL (ref 0.2–1.2)
Total Protein: 6.8 g/dL (ref 6.1–8.1)
eGFR: 72 mL/min/{1.73_m2} (ref >=60)

## 2020-11-20 LAB — IRON,TIBC AND FERRITIN PANEL
%SAT: 19 % (calc) (ref 16–45)
Ferritin: 59 ng/mL (ref 16–232)
Iron: 73 ug/dL (ref 45–160)
TIBC: 384 mcg/dL (calc) (ref 250–450)

## 2020-11-20 LAB — VITAMIN D 25 HYDROXY (VIT D DEFICIENCY, FRACTURES): Vit D, 25-Hydroxy: 59 ng/mL (ref 30–100)

## 2020-11-20 LAB — B12 AND FOLATE PANEL
Folate: >24 ng/mL
Vitamin B-12: 833 pg/mL (ref 200–1100)

## 2020-11-20 LAB — MAGNESIUM: Magnesium: 2.3 mg/dL (ref 1.5–2.5)

## 2020-11-20 LAB — ZINC: Zinc: 97 ug/dL (ref 60–130)

## 2020-11-21 ENCOUNTER — Encounter: Payer: Self-pay | Admitting: Physician Assistant

## 2020-11-21 NOTE — Progress Notes (Signed)
So far labs look great!

## 2020-11-25 ENCOUNTER — Telehealth: Payer: Self-pay | Admitting: Neurology

## 2020-11-25 ENCOUNTER — Other Ambulatory Visit: Payer: Self-pay | Admitting: Physician Assistant

## 2020-11-25 ENCOUNTER — Encounter: Payer: Self-pay | Admitting: Physician Assistant

## 2020-11-25 DIAGNOSIS — B159 Hepatitis A without hepatic coma: Secondary | ICD-10-CM | POA: Insufficient documentation

## 2020-11-25 DIAGNOSIS — R202 Paresthesia of skin: Secondary | ICD-10-CM

## 2020-11-25 DIAGNOSIS — K760 Fatty (change of) liver, not elsewhere classified: Secondary | ICD-10-CM | POA: Insufficient documentation

## 2020-11-25 LAB — PROTEIN ELECTROPHORESIS, SERUM, WITH REFLEX
Albumin ELP: 4.3 g/dL (ref 3.8–4.8)
Alpha 1: 0.3 g/dL (ref 0.2–0.3)
Alpha 2: 0.6 g/dL (ref 0.5–0.9)
Beta 2: 0.3 g/dL (ref 0.2–0.5)
Beta Globulin: 0.5 g/dL (ref 0.4–0.6)
Gamma Globulin: 0.9 g/dL (ref 0.8–1.7)
Total Protein: 6.9 g/dL (ref 6.1–8.1)

## 2020-11-25 LAB — TSH: TSH: 1.37 mIU/L

## 2020-11-25 LAB — CP4508-PT/INR AND PTT
INR: 1.1
Prothrombin Time: 11.1 s (ref 9.0–11.5)
aPTT: 29 s (ref 23–32)

## 2020-11-25 LAB — CERULOPLASMIN: Ceruloplasmin: 32 mg/dL (ref 18–53)

## 2020-11-25 LAB — HEPATITIS A ANTIBODY, TOTAL: Hepatitis A AB,Total: REACTIVE — AB

## 2020-11-25 LAB — GAMMA GT: GGT: 51 U/L (ref 3–70)

## 2020-11-25 LAB — ALPHA-1-ANTITRYPSIN: A-1 Antitrypsin, Ser: 162 mg/dL (ref 83–199)

## 2020-11-25 LAB — CK: Total CK: 105 U/L (ref 29–143)

## 2020-11-25 LAB — HEPATITIS C ANTIBODY
Hepatitis C Ab: NONREACTIVE
SIGNAL TO CUT-OFF: 0.01 (ref <1.00)

## 2020-11-25 LAB — ALDOLASE: Aldolase: 8.9 U/L — ABNORMAL HIGH (ref <=8.1)

## 2020-11-25 LAB — TISSUE TRANSGLUTAMINASE, IGA: (tTG) Ab, IgA: <1 U/mL

## 2020-11-25 LAB — TISSUE TRANSGLUTAMINASE, IGG: (tTG) Ab, IgG: <1 U/mL

## 2020-11-25 LAB — ANA: Anti Nuclear Antibody (ANA): NEGATIVE

## 2020-11-25 LAB — HEPATITIS B SURFACE ANTIGEN: Hepatitis B Surface Ag: NONREACTIVE

## 2020-11-25 NOTE — Progress Notes (Signed)
You are positive for hepatitis A. This is a self limiting viral infection of liver. Usually spread by contaminated food and water. Avoid tylenol. Drink plenty of fluids it can take up to 6 months for liver enzymes to go back to normal. Recheck liver enzymes in 3-4 weeks to make sure trending down and not up.

## 2020-11-25 NOTE — Telephone Encounter (Signed)
Received a vm from Sylvie Farrier [phone]) with [employer] stating they had faxed a request for a narrative statement from [employer] to support patient's disability request. I haven't seen a fax come to [place], have you? They would like to discuss with you if you would be willing to write this? The number above is a direct number.

## 2020-11-25 NOTE — Progress Notes (Signed)
Fatty liver detected which can cause liver enzymes to be abnormal. You also did mention marijuana use at last visit. That could have also causes some liver enzyme elevation. Stop use and lets see if liver enzymes start trending down.

## 2020-11-25 NOTE — Progress Notes (Signed)
Can we add IGM anti-hepatitis virus

## 2020-11-26 NOTE — Telephone Encounter (Signed)
LMOM for Julie Johnston asking him to send fax since we did not receive it, with examples and what he needs and she would be happy to write this note. Awaiting fax.

## 2020-12-04 ENCOUNTER — Other Ambulatory Visit: Payer: Self-pay | Admitting: Physician Assistant

## 2020-12-11 ENCOUNTER — Telehealth: Payer: Self-pay | Admitting: Neurology

## 2020-12-11 ENCOUNTER — Other Ambulatory Visit: Payer: Self-pay | Admitting: Physician Assistant

## 2020-12-11 DIAGNOSIS — R202 Paresthesia of skin: Secondary | ICD-10-CM

## 2020-12-11 NOTE — Telephone Encounter (Signed)
Form completed for patient's disability claim, copy to scan and to hold. Given to front desk to call for payment.

## 2020-12-16 ENCOUNTER — Ambulatory Visit (INDEPENDENT_AMBULATORY_CARE_PROVIDER_SITE_OTHER): Payer: 59 | Admitting: Physician Assistant

## 2020-12-16 VITALS — BP 124/60 | HR 80 | Ht 65.0 in | Wt 224.0 lb

## 2020-12-16 DIAGNOSIS — G43111 Migraine with aura, intractable, with status migrainosus: Secondary | ICD-10-CM | POA: Insufficient documentation

## 2020-12-16 DIAGNOSIS — R748 Abnormal levels of other serum enzymes: Secondary | ICD-10-CM | POA: Diagnosis not present

## 2020-12-16 DIAGNOSIS — B159 Hepatitis A without hepatic coma: Secondary | ICD-10-CM | POA: Diagnosis not present

## 2020-12-16 MED ORDER — DEXAMETHASONE SODIUM PHOSPHATE 10 MG/ML IJ SOLN
10.0000 mg | Freq: Once | INTRAMUSCULAR | Status: AC
  Administered 2020-12-16: 10 mg via INTRAMUSCULAR

## 2020-12-16 MED ORDER — KETOROLAC TROMETHAMINE 30 MG/ML IJ SOLN
30.0000 mg | Freq: Once | INTRAMUSCULAR | Status: AC
Start: 2020-12-16 — End: 2020-12-16
  Administered 2020-12-16: 30 mg via INTRAMUSCULAR

## 2020-12-16 NOTE — Progress Notes (Signed)
3 days Sensitive to some sounds, light Nausea, no vomiting Back of head to neck Forehead  Has tried rizatriptan, ice, heat, sleeping Saw chiropractor this AM

## 2020-12-16 NOTE — Patient Instructions (Signed)
Migraine Headache A migraine headache is an intense, throbbing pain on one side or both sides of the head. Migraine headaches may also cause other symptoms, such as nausea, vomiting, and sensitivity to light and noise. A migraine headache can last from 4 hours to 3 days. Talk with your doctor about what things may bring on (trigger) your migraine headaches. What are the causes? The exact cause of this condition is not known. However, a migraine may be caused when nerves in the brain become irritated and release chemicals that cause inflammation of blood vessels. This inflammation causes pain. This condition may be triggered or caused by: Drinking alcohol. Smoking. Taking medicines, such as: Medicine used to treat chest pain (nitroglycerin). Birth control pills. Estrogen. Certain blood pressure medicines. Eating or drinking products that contain nitrates, glutamate, aspartame, or tyramine. Aged cheeses, chocolate, or caffeine may also be triggers. Doing physical activity. Other things that may trigger a migraine headache include: Menstruation. Pregnancy. Hunger. Stress. Lack of sleep or too much sleep. Weather changes. Fatigue. What increases the risk? The following factors may make you more likely to experience migraine headaches: Being a certain age. This condition is more common in people who are 25-55 years old. Being female. Having a family history of migraine headaches. Being Caucasian. Having a mental health condition, such as depression or anxiety. Being obese. What are the signs or symptoms? The main symptom of this condition is pulsating or throbbing pain. This pain may: Happen in any area of the head, such as on one side or both sides. Interfere with daily activities. Get worse with physical activity. Get worse with exposure to bright lights or loud noises. Other symptoms may include: Nausea. Vomiting. Dizziness. General sensitivity to bright lights, loud noises, or  smells. Before you get a migraine headache, you may get warning signs (an aura). An aura may include: Seeing flashing lights or having blind spots. Seeing bright spots, halos, or zigzag lines. Having tunnel vision or blurred vision. Having numbness or a tingling feeling. Having trouble talking. Having muscle weakness. Some people have symptoms after a migraine headache (postdromal phase), such as: Feeling tired. Difficulty concentrating. How is this diagnosed? A migraine headache can be diagnosed based on: Your symptoms. A physical exam. Tests, such as: CT scan or an MRI of the head. These imaging tests can help rule out other causes of headaches. Taking fluid from the spine (lumbar puncture) and analyzing it (cerebrospinal fluid analysis, or CSF analysis). How is this treated? This condition may be treated with medicines that: Relieve pain. Relieve nausea. Prevent migraine headaches. Treatment for this condition may also include: Acupuncture. Lifestyle changes like avoiding foods that trigger migraine headaches. Biofeedback. Cognitive behavioral therapy. Follow these instructions at home: Medicines Take over-the-counter and prescription medicines only as told by your health care provider. Ask your health care provider if the medicine prescribed to you: Requires you to avoid driving or using heavy machinery. Can cause constipation. You may need to take these actions to prevent or treat constipation: Drink enough fluid to keep your urine pale yellow. Take over-the-counter or prescription medicines. Eat foods that are high in fiber, such as beans, whole grains, and fresh fruits and vegetables. Limit foods that are high in fat and processed sugars, such as fried or sweet foods. Lifestyle Do not drink alcohol. Do not use any products that contain nicotine or tobacco, such as cigarettes, e-cigarettes, and chewing tobacco. If you need help quitting, ask your health care  provider. Get at least 8   hours of sleep every night. Find ways to manage stress, such as meditation, deep breathing, or yoga. General instructions   Keep a journal to find out what may trigger your migraine headaches. For example, write down: What you eat and drink. How much sleep you get. Any change to your diet or medicines. If you have a migraine headache: Avoid things that make your symptoms worse, such as bright lights. It may help to lie down in a dark, quiet room. Do not drive or use heavy machinery. Ask your health care provider what activities are safe for you while you are experiencing symptoms. Keep all follow-up visits as told by your health care provider. This is important. Contact a health care provider if: You develop symptoms that are different or more severe than your usual migraine headache symptoms. You have more than 15 headache days in one month. Get help right away if: Your migraine headache becomes severe. Your migraine headache lasts longer than 72 hours. You have a fever. You have a stiff neck. You have vision loss. Your muscles feel weak or like you cannot control them. You start to lose your balance often. You have trouble walking. You faint. You have a seizure. Summary A migraine headache is an intense, throbbing pain on one side or both sides of the head. Migraines may also cause other symptoms, such as nausea, vomiting, and sensitivity to light and noise. This condition may be treated with medicines and lifestyle changes. You may also need to avoid certain things that trigger a migraine headache. Keep a journal to find out what may trigger your migraine headaches. Contact your health care provider if you have more than 15 headache days in a month or you develop symptoms that are different or more severe than your usual migraine headache symptoms. This information is not intended to replace advice given to you by your health care provider. Make sure you  discuss any questions you have with your health care provider. Document Revised: 06/30/2018 Document Reviewed: 04/20/2018 Elsevier Patient Education  2022 [employer] Inc[employer]  

## 2020-12-17 ENCOUNTER — Encounter: Payer: Self-pay | Admitting: Physician Assistant

## 2020-12-17 DIAGNOSIS — R748 Abnormal levels of other serum enzymes: Secondary | ICD-10-CM | POA: Insufficient documentation

## 2020-12-17 NOTE — Progress Notes (Signed)
Subjective:    Patient ID: Julie Johnston, female    DOB: 07-16-1970, 50 y.o.   MRN: [id number]  HPI Patient is a 50 year old female with a history of migraines who presents to the clinic with a 3-day migraine.  She is sensitive to light and some sounds.  She is very nauseated but no vomiting.  Pain radiates from the front of her head to the back of her head and down into her neck bilaterally.  She has always had bilateral migraines.  She is taking Maxalt for the last 3 days without relief. She is using ice packs, heat, sleeping and saw chiropractor this am.  She is on Qulipta for prevention. No trauma or known trigger. No speech changes or upper extremity strength changes.   Liver enzymes elevated at last visit about 3 weeks ago. Needs order to have rechecked today.   .. Active Ambulatory Problems    Diagnosis Date Noted   Lyme disease 05/09/2015   Insomnia 05/09/2015   Palpitations 05/09/2015   Chronic fatigue 05/09/2015   No energy 05/09/2015   Hyperlipidemia 05/09/2015   Pap smear abnormality of cervix with LGSIL 02/07/2014   Attention deficit hyperactivity disorder (ADHD), predominantly inattentive type 05/09/2015   Major depressive disorder    Pre-diabetes 05/14/2015   Obstructive sleep apnea 06/18/2015   Urine ketones 09/22/2015   Fibromyalgia 09/22/2015   Obesity 09/22/2015   Drug-induced weight gain 02/23/2016   Migraine 11/29/2017   Dysuria 11/29/2017   Potential exposure to STD 12/16/2017   Frequent infections 05/08/2018   Decreased thyroid stimulating hormone (TSH) level 05/08/2018   Equivocal stress echocardiogram 05/17/2018   Influenza-like illness 07/20/2018   Essential hypertension 06/04/2019   Post concussion syndrome 10/01/2019   Acute thoracic back pain 10/01/2019   PTSD (post-traumatic stress disorder) 10/01/2019   Right posterior sixth rib fracture 10/02/2019   Generalized anxiety disorder 11/07/2019   Mild traumatic brain injury 09/12/2019    Pulmonary collapse 06/10/2014   Polyp of transverse colon 02/25/2020   Diverticulosis of colon 02/25/2020   Internal hemorrhoids 02/25/2020   Dizziness 05/19/2020   Double vision 05/19/2020   Vertigo 05/19/2020   Controlled type 2 diabetes mellitus with complication, without long-term current use of insulin ([place]) 05/27/2020   Severe obesity (BMI >= 40) ([place]) 05/27/2020   Unsteadiness on feet 06/02/2020   Other abnormalities of gait and mobility 06/02/2020   Acute intractable headache 08/01/2020   Head injury 08/01/2020   Post-concussion headache 08/04/2020   Parotid swelling 08/13/2020   Ear fullness, right 08/14/2020   Parotiditis 08/14/2020   Foreign body (FB) in soft tissue 09/19/2020   S/P laparoscopic sleeve gastrectomy 11/18/2020   Paresthesia 40/98/1191   Non-alcoholic fatty liver disease 11/25/2020   Hepatitis A 11/25/2020   Intractable migraine with aura with status migrainosus 12/16/2020   Elevated liver enzymes 12/17/2020   Resolved Ambulatory Problems    Diagnosis Date Noted   Influenza B 12/16/2017   Past Medical History:  Diagnosis Date   Obesity (BMI 35.0-39.9 without comorbidity)     Review of Systems See HPI.     Objective:   Physical Exam Vitals reviewed.  Constitutional:      Comments: Found sitting in a dark room with sunglasses on.   HENT:     Head: Normocephalic.  Eyes:     Pupils: Pupils are equal, round, and reactive to light.     Comments: No able to perform EOM.   Cardiovascular:     Rate and Rhythm: Normal  rate and regular rhythm.     Pulses: Normal pulses.     Heart sounds: Normal heart sounds.  Pulmonary:     Effort: Pulmonary effort is normal.  Neurological:     General: No focal deficit present.     Mental Status: She is oriented to person, place, and time.     Cranial Nerves: No cranial nerve deficit.     Sensory: No sensory deficit.     Motor: No weakness.     Coordination: Coordination normal.     Gait: Gait normal.      Deep Tendon Reflexes: Reflexes normal.  Psychiatric:        Mood and Affect: Mood normal.          Assessment & Plan:  [name] KitchenMarland KitchenEthleen was seen today for migraine.  Diagnoses and all orders for this visit:  Intractable migraine with aura with status migrainosus -     dexamethasone (DECADRON) injection 10 mg -     ketorolac (TORADOL) 30 MG/ML injection 30 mg  Elevated liver enzymes -     Hepatic function panel -     Hepatitis A antibody, IgM  Migraine IM cocktail given.  Rest and hydrate.  Use flexeril to help relax muscle in neck.  Follow up as needed.  Labs ordered for liver enzyme recheck.

## 2020-12-18 ENCOUNTER — Encounter: Payer: Self-pay | Admitting: Physician Assistant

## 2020-12-18 DIAGNOSIS — R748 Abnormal levels of other serum enzymes: Secondary | ICD-10-CM

## 2020-12-18 LAB — HEPATITIS A ANTIBODY, IGM: Hep A IgM: NONREACTIVE

## 2020-12-18 LAB — HEPATIC FUNCTION PANEL
AG Ratio: 1.6 (calc) (ref 1.0–2.5)
ALT: 151 U/L — ABNORMAL HIGH (ref 6–29)
AST: 96 U/L — ABNORMAL HIGH (ref 10–35)
Albumin: 4.6 g/dL (ref 3.6–5.1)
Alkaline phosphatase (APISO): 191 U/L — ABNORMAL HIGH (ref 37–153)
Bilirubin, Direct: 0.2 mg/dL (ref 0.0–0.2)
Globulin: 2.8 g/dL (calc) (ref 1.9–3.7)
Indirect Bilirubin: 0.4 mg/dL (calc) (ref 0.2–1.2)
Total Bilirubin: 0.6 mg/dL (ref 0.2–1.2)
Total Protein: 7.4 g/dL (ref 6.1–8.1)

## 2020-12-18 NOTE — Progress Notes (Signed)
No active Hep A. Liver enzymes remain elevated. I would like you to see GI for further work up of liver enzymes.

## 2020-12-19 ENCOUNTER — Other Ambulatory Visit: Payer: Self-pay | Admitting: Physician Assistant

## 2020-12-19 DIAGNOSIS — G43801 Other migraine, not intractable, with status migrainosus: Secondary | ICD-10-CM

## 2020-12-26 ENCOUNTER — Encounter: Payer: Self-pay | Admitting: Physician Assistant

## 2020-12-29 ENCOUNTER — Other Ambulatory Visit: Payer: Self-pay | Admitting: Neurology

## 2021-01-06 ENCOUNTER — Other Ambulatory Visit: Payer: Self-pay | Admitting: Physician Assistant

## 2021-01-06 DIAGNOSIS — R202 Paresthesia of skin: Secondary | ICD-10-CM

## 2021-01-09 ENCOUNTER — Other Ambulatory Visit: Payer: Self-pay | Admitting: Physician Assistant

## 2021-01-09 DIAGNOSIS — M26629 Arthralgia of temporomandibular joint, unspecified side: Secondary | ICD-10-CM | POA: Insufficient documentation

## 2021-02-07 ENCOUNTER — Other Ambulatory Visit: Payer: Self-pay | Admitting: Physician Assistant

## 2021-02-07 DIAGNOSIS — R059 Cough, unspecified: Secondary | ICD-10-CM

## 2021-02-07 DIAGNOSIS — Z20822 Contact with and (suspected) exposure to covid-19: Secondary | ICD-10-CM
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
## 2021-02-16 ENCOUNTER — Other Ambulatory Visit: Payer: Self-pay

## 2021-02-16 ENCOUNTER — Encounter: Payer: Self-pay | Admitting: Neurology

## 2021-02-16 ENCOUNTER — Telehealth (INDEPENDENT_AMBULATORY_CARE_PROVIDER_SITE_OTHER): Payer: 59 | Admitting: Neurology

## 2021-02-16 DIAGNOSIS — F0781 Postconcussional syndrome: Secondary | ICD-10-CM

## 2021-02-16 DIAGNOSIS — G43009 Migraine without aura, not intractable, without status migrainosus: Secondary | ICD-10-CM

## 2021-03-02 ENCOUNTER — Other Ambulatory Visit: Payer: Self-pay | Admitting: Neurology

## 2021-03-02 ENCOUNTER — Other Ambulatory Visit: Payer: Self-pay | Admitting: Physician Assistant

## 2021-03-04 IMAGING — CT CT CERVICAL SPINE W/O CM
4 of 5 series · 13 of 33 positions shown, 15 images · non-contrast
Comparison: None.

CLINICAL DATA: Head injury. Run over by a horse 20 days ago.
Headaches.

EXAM:
CT HEAD WITHOUT CONTRAST
CT CERVICAL SPINE WITHOUT CONTRAST
TECHNIQUE: Multidetector CT imaging of the head and cervical spine was
performed following the standard protocol without intravenous
contrast. Multiplanar CT image reconstructions of the cervical spine
were also generated.

[Series 2: c_spine 2.0 br62 3 · axial · 0.35mm/px · z∈[-268,-172]mm · 3 of 96 slices shown, 4 images]
[im 24/96  soft-tissue]
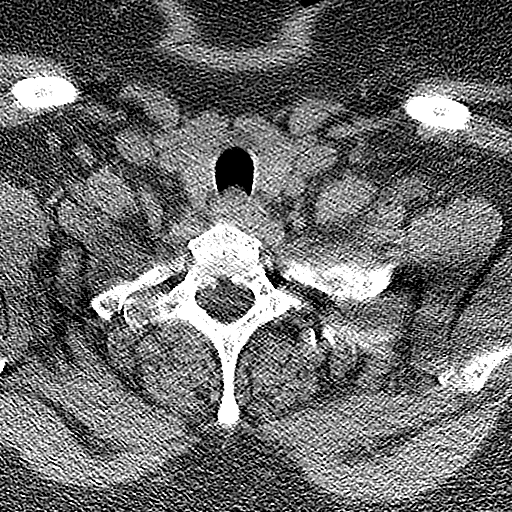
[im 24/96  bone]
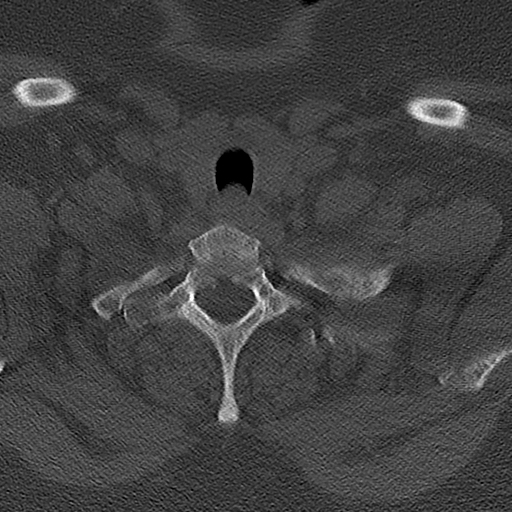
[im 48/96  bone]
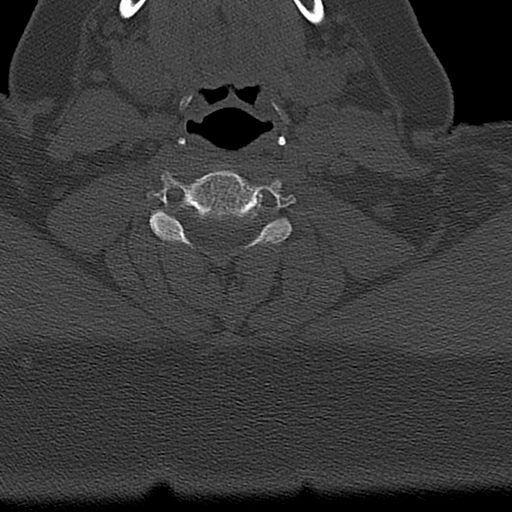
[im 72/96  bone]
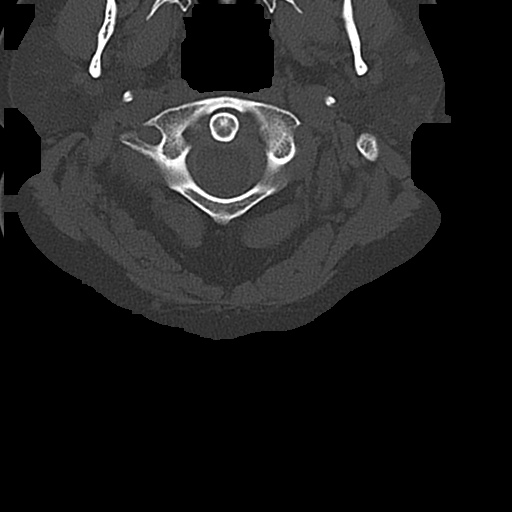

[Series 5: coronals · coronal · 0.32mm/px · 3 of 77 slices shown]
[im 20/77  bone]
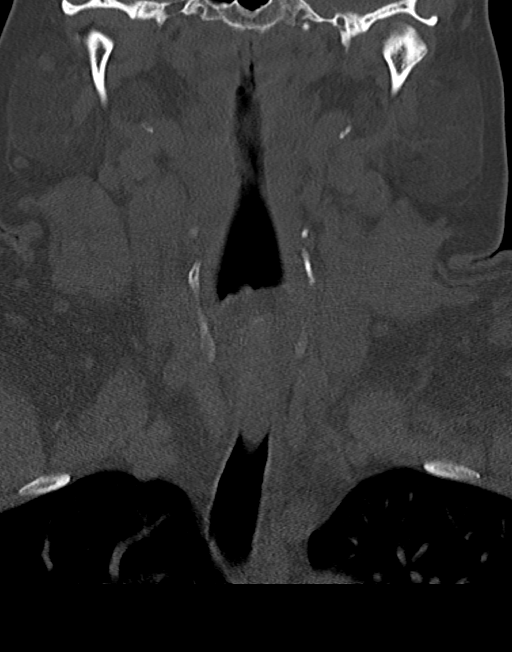
[im 32/77  bone]
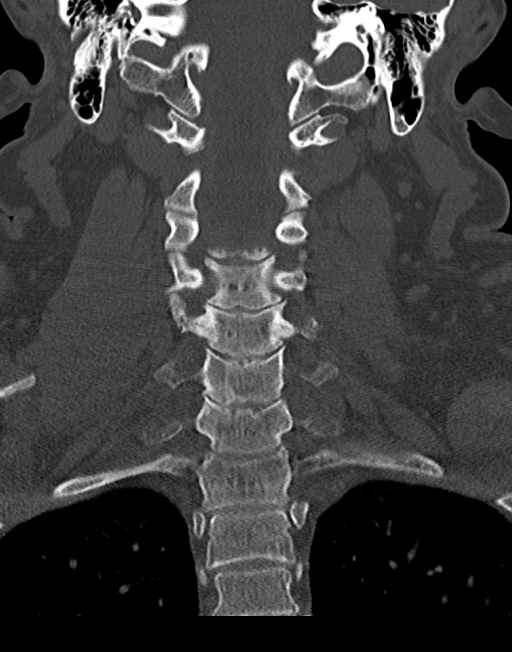
[im 45/77  bone]
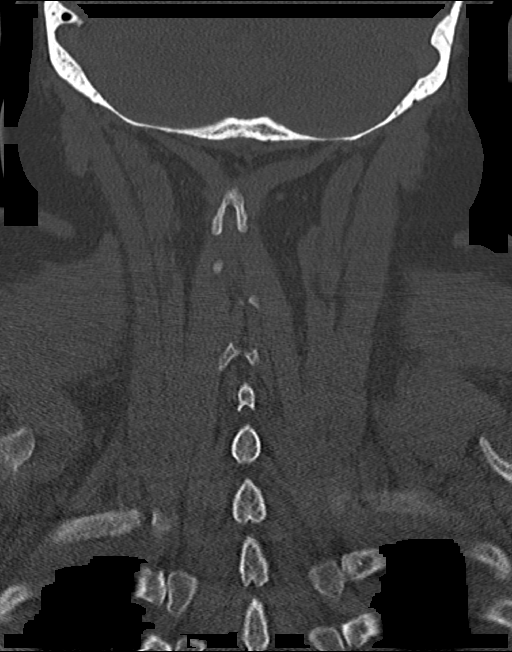

[Series 6: sagittals · sagittal · 0.25mm/px · 5 of 65 slices shown, 6 images]
[im 22/65  bone]
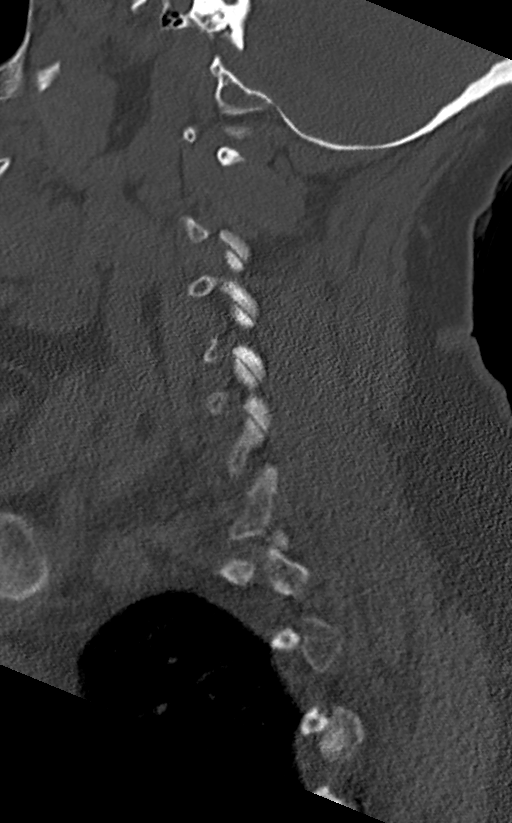
[im 27/65  bone]
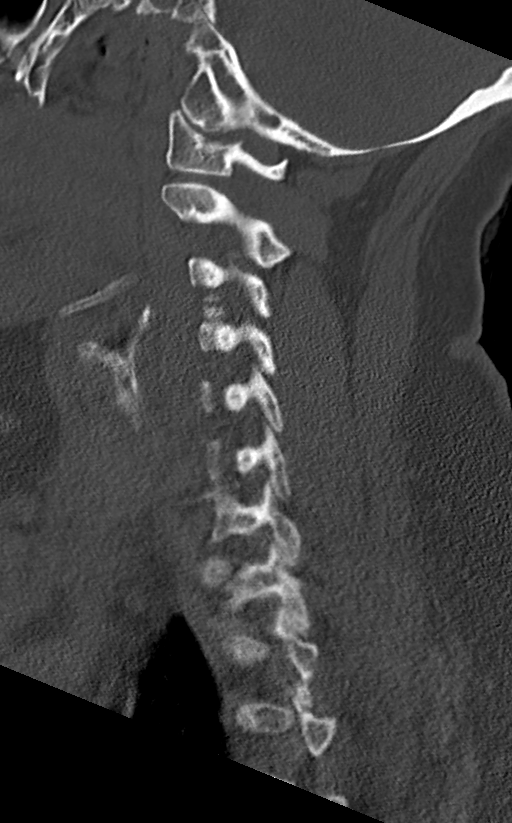
[im 33/65  soft-tissue]
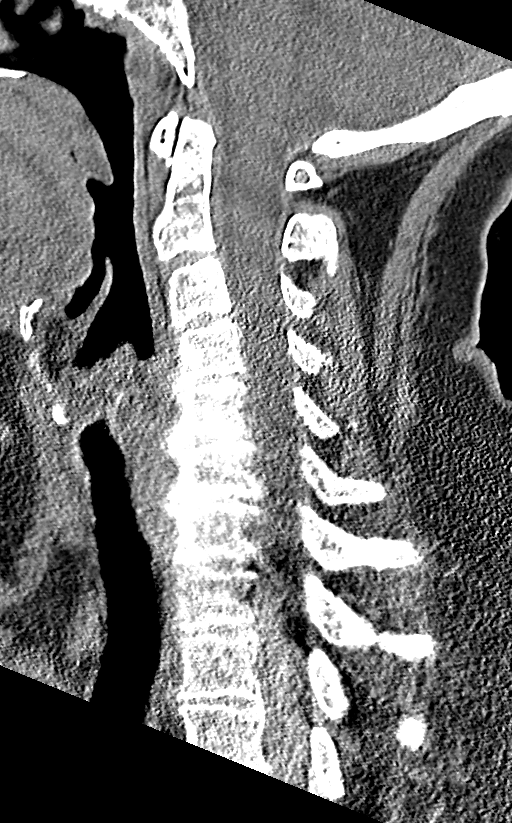
[im 33/65  bone]
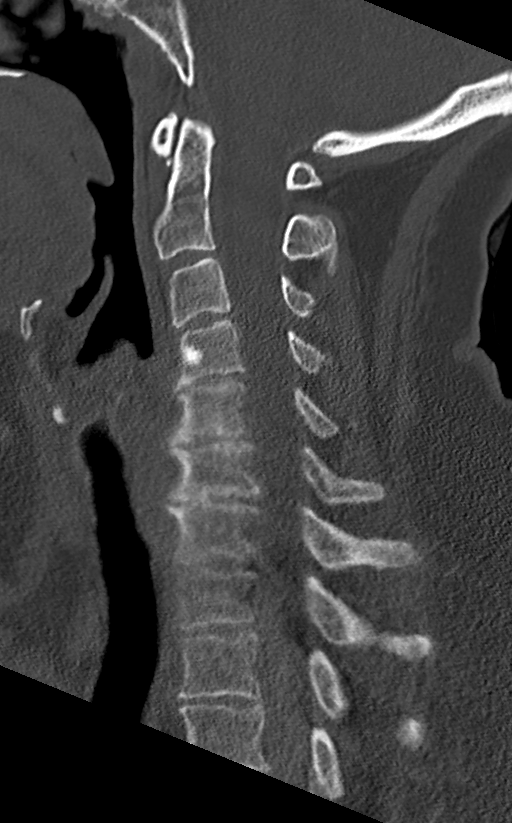
[im 38/65  bone]
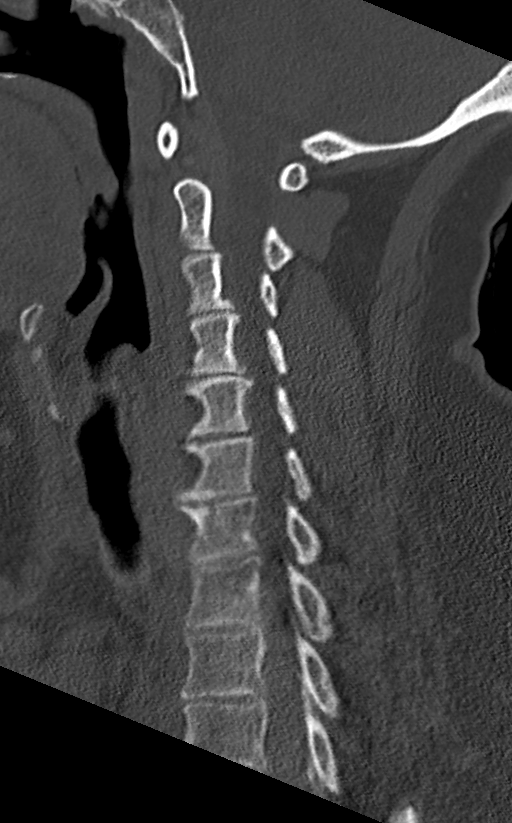
[im 43/65  bone]
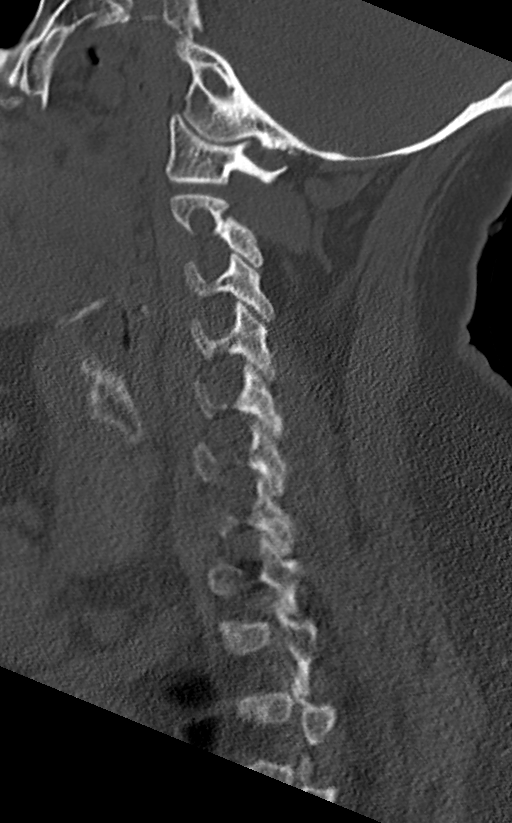

[Series 7: orthogonals · axial · 0.31mm/px · z∈[-306,-255]mm · 2 of 113 slices shown]
[im 29/113  bone]
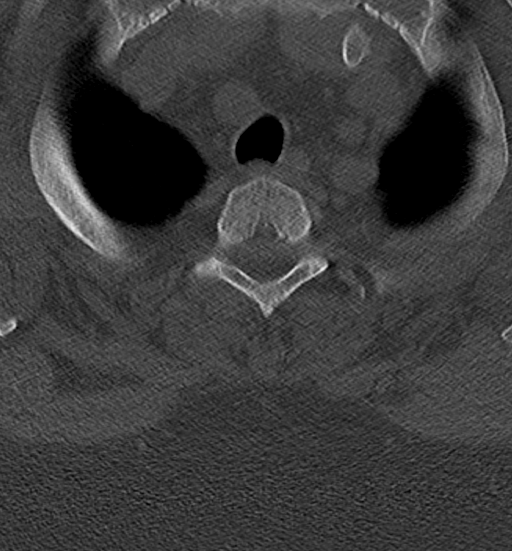
[im 57/113  bone]
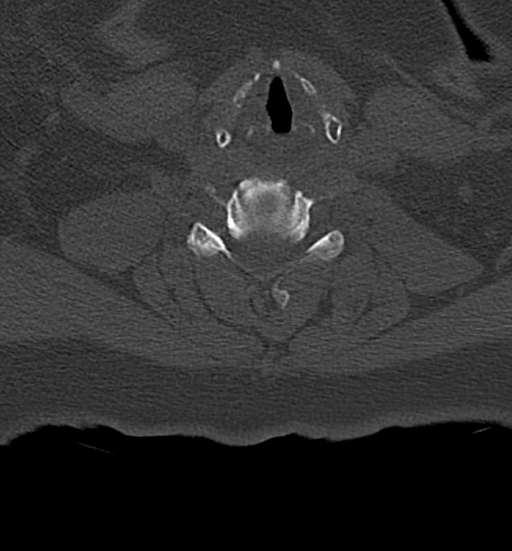

[13 of 33 positions shown; findings below may reference images not displayed]

FINDINGS: CT HEAD FINDINGS

Brain: No evidence of acute infarction, hemorrhage, hydrocephalus,
extra-axial collection or mass lesion/mass effect.

Vascular: Negative for hyperdense vessel

Skull: Negative

Sinuses/Orbits: Paranasal sinuses clear.  Negative orbit.

Other: None

CT CERVICAL SPINE FINDINGS

Alignment: Normal alignment with straightening of the cervical
lordosis

Skull base and vertebrae: Negative for fracture.

4 mm sclerotic lesion C4 vertebral body anteriorly. Probable
incidental benign lesion. If the patient has a history of
malignancy, further evaluation with MRI would be helpful.

Soft tissues and spinal canal: Negative

Disc levels: Disc degeneration and spurring C3 through C7. Mild
spinal stenosis C5-6. Spinal stenosis and mild foraminal stenosis
bilaterally C6-7 due to spurring.

Upper chest: Lung apices clear bilaterally.

Other: None
IMPRESSION: 1. No acute intracranial abnormality
2. Negative for cervical spine fracture.
3. 4 mm sclerotic lesion in the C4 vertebral body anteriorly.
Assuming the patient has no known malignancy, this is likely benign.

## 2021-03-06 ENCOUNTER — Other Ambulatory Visit: Payer: Self-pay

## 2021-03-06 ENCOUNTER — Ambulatory Visit (INDEPENDENT_AMBULATORY_CARE_PROVIDER_SITE_OTHER): Payer: 59 | Admitting: Family Medicine

## 2021-03-06 VITALS — BP 125/71 | HR 80 | Temp 97.8°F | Ht 65.0 in | Wt 198.0 lb

## 2021-03-06 DIAGNOSIS — R8290 Unspecified abnormal findings in urine: Secondary | ICD-10-CM

## 2021-03-06 LAB — POCT URINALYSIS DIP (CLINITEK)
Bilirubin, UA: NEGATIVE
Blood, UA: NEGATIVE
Glucose, UA: NEGATIVE mg/dL
Leukocytes, UA: NEGATIVE
Nitrite, UA: NEGATIVE
Spec Grav, UA: 1.025 (ref 1.010–1.025)
Urobilinogen, UA: 0.2 E.U./dL
pH, UA: 6 (ref 5.0–8.0)

## 2021-03-06 NOTE — Progress Notes (Signed)
Pt presents today for UTI symptoms.  Pressure, foul odor, and burning.  Tests: POCT urine.  Sent for culture.   Per Olevia Bowens, patient to continue with hydrating and OTC. Will contact with culture results and recommendations.

## 2021-03-06 NOTE — Progress Notes (Signed)
Medical screening examination/treatment was performed by qualified clinical staff member and as supervising provider I was immediately available for consultation/collaboration. I have reviewed documentation and agree with assessment and plan. ? ?Kelise Kuch B Jeanet Lupe, NP ? ?

## 2021-03-08 LAB — URINE CULTURE
MICRO NUMBER:: 12769269
SPECIMEN QUALITY:: ADEQUATE

## 2021-03-10 ENCOUNTER — Other Ambulatory Visit: Payer: Self-pay | Admitting: Physician Assistant

## 2021-03-10 DIAGNOSIS — E785 Hyperlipidemia, unspecified: Secondary | ICD-10-CM

## 2021-03-10 DIAGNOSIS — R059 Cough, unspecified: Secondary | ICD-10-CM

## 2021-03-10 DIAGNOSIS — Z20822 Contact with and (suspected) exposure to covid-19: Secondary | ICD-10-CM

## 2021-03-10 DIAGNOSIS — E119 Type 2 diabetes mellitus without complications: Secondary | ICD-10-CM

## 2021-03-24 DIAGNOSIS — R69 Illness, unspecified: Secondary | ICD-10-CM | POA: Diagnosis not present

## 2021-03-24 DIAGNOSIS — F332 Major depressive disorder, recurrent severe without psychotic features: Secondary | ICD-10-CM | POA: Diagnosis not present

## 2021-03-30 ENCOUNTER — Other Ambulatory Visit: Payer: Self-pay | Admitting: Physician Assistant

## 2021-03-31 DIAGNOSIS — R69 Illness, unspecified: Secondary | ICD-10-CM | POA: Diagnosis not present

## 2021-03-31 DIAGNOSIS — F332 Major depressive disorder, recurrent severe without psychotic features: Secondary | ICD-10-CM | POA: Diagnosis not present

## 2021-04-06 DIAGNOSIS — Z20822 Contact with and (suspected) exposure to covid-19: Secondary | ICD-10-CM | POA: Diagnosis not present

## 2021-04-08 ENCOUNTER — Encounter: Payer: Self-pay | Admitting: Physician Assistant

## 2021-04-08 ENCOUNTER — Other Ambulatory Visit: Payer: Self-pay

## 2021-04-08 ENCOUNTER — Ambulatory Visit (INDEPENDENT_AMBULATORY_CARE_PROVIDER_SITE_OTHER): Payer: 59 | Admitting: Physician Assistant

## 2021-04-08 VITALS — BP 133/77 | HR 88 | Temp 99.2°F | Ht 65.0 in | Wt 197.0 lb

## 2021-04-08 DIAGNOSIS — J3489 Other specified disorders of nose and nasal sinuses: Secondary | ICD-10-CM

## 2021-04-08 DIAGNOSIS — J101 Influenza due to other identified influenza virus with other respiratory manifestations: Secondary | ICD-10-CM

## 2021-04-08 DIAGNOSIS — R52 Pain, unspecified: Secondary | ICD-10-CM

## 2021-04-08 DIAGNOSIS — R051 Acute cough: Secondary | ICD-10-CM | POA: Diagnosis not present

## 2021-04-08 LAB — POCT INFLUENZA A/B
Influenza A, POC: NEGATIVE
Influenza B, POC: POSITIVE — AB

## 2021-04-08 MED ORDER — KETOROLAC TROMETHAMINE 60 MG/2ML IM SOLN
60.0000 mg | Freq: Once | INTRAMUSCULAR | Status: AC
  Administered 2021-04-08: 60 mg via INTRAMUSCULAR

## 2021-04-08 MED ORDER — OSELTAMIVIR PHOSPHATE 75 MG PO CAPS: 75.0000 mg | ORAL_CAPSULE | Freq: Two times a day (BID) | ORAL | 0 refills | Status: DC

## 2021-04-08 NOTE — Patient Instructions (Signed)

## 2021-04-08 NOTE — Progress Notes (Signed)
Subjective:    Patient ID: Julie Johnston, female    DOB: [birth year], 51 y.o.   MRN: [id number]  HPI Pt is a 51 yo female who presents to the clinic with fever, ST, fatigue, body aches, congestion, cough since Sunday. She went to [facility] and tested negative for flu and covid on Monday. She has taken nyquil and dayquil with little relief. No SOB.   [name] Kitchen. Active Ambulatory Problems    Diagnosis Date Noted   Lyme disease 05/09/2015   Insomnia 05/09/2015   Palpitations 05/09/2015   Chronic fatigue 05/09/2015   No energy 05/09/2015   Hyperlipidemia 05/09/2015   Pap smear abnormality of cervix with LGSIL 02/07/2014   Attention deficit hyperactivity disorder (ADHD), predominantly inattentive type 05/09/2015   Major depressive disorder    Pre-diabetes 05/14/2015   Obstructive sleep apnea 06/18/2015   Urine ketones 09/22/2015   Fibromyalgia 09/22/2015   Obesity 09/22/2015   Drug-induced weight gain 02/23/2016   Migraine 11/29/2017   Dysuria 11/29/2017   Potential exposure to STD 12/16/2017   Frequent infections 05/08/2018   Decreased thyroid stimulating hormone (TSH) level 05/08/2018   Equivocal stress echocardiogram 05/17/2018   Influenza-like illness 07/20/2018   Essential hypertension 06/04/2019   Post concussion syndrome 10/01/2019   Acute thoracic back pain 10/01/2019   PTSD (post-traumatic stress disorder) 10/01/2019   Right posterior sixth rib fracture 10/02/2019   Generalized anxiety disorder 11/07/2019   Mild traumatic brain injury 09/12/2019   Pulmonary collapse 06/10/2014   Polyp of transverse colon 02/25/2020   Diverticulosis of colon 02/25/2020   Internal hemorrhoids 02/25/2020   Dizziness 05/19/2020   Double vision 05/19/2020   Vertigo 05/19/2020   Controlled type 2 diabetes mellitus with complication, without long-term current use of insulin ([place]) 05/27/2020   Severe obesity (BMI >= 40) ([place]) 05/27/2020   Unsteadiness on feet 06/02/2020   Other abnormalities of  gait and mobility 06/02/2020   Acute intractable headache 08/01/2020   Head injury 08/01/2020   Post-concussion headache 08/04/2020   Parotid swelling 08/13/2020   Ear fullness, right 08/14/2020   Parotiditis 08/14/2020   Foreign body (FB) in soft tissue 09/19/2020   S/P laparoscopic sleeve gastrectomy 11/18/2020   Paresthesia 32/44/0102   Non-alcoholic fatty liver disease 11/25/2020   Current hepatitis A infection 11/25/2020   Intractable migraine with aura with status migrainosus 12/16/2020   Elevated liver enzymes 12/17/2020   Resolved Ambulatory Problems    Diagnosis Date Noted   Influenza B 12/16/2017   Past Medical History:  Diagnosis Date   Obesity (BMI 35.0-39.9 without comorbidity)         Review of Systems    See HPI.  Objective:   Physical Exam Vitals reviewed.  Constitutional:      Comments: Pale ill appearing  HENT:     Head: Normocephalic.     Right Ear: Tympanic membrane, ear canal and external ear normal. There is no impacted cerumen.     Left Ear: Tympanic membrane, ear canal and external ear normal. There is no impacted cerumen.     Nose: Nose normal. No congestion.     Mouth/Throat:     Mouth: Mucous membranes are moist.     Pharynx: Posterior oropharyngeal erythema present.  Eyes:     General:        Right eye: No discharge.        Left eye: No discharge.     Conjunctiva/sclera: Conjunctivae normal.     Pupils: Pupils are equal, round, and reactive to  light.  Cardiovascular:     Rate and Rhythm: Normal rate and regular rhythm.     Pulses: Normal pulses.  Pulmonary:     Effort: Pulmonary effort is normal.     Breath sounds: Normal breath sounds.  Neurological:     General: No focal deficit present.  Psychiatric:        Mood and Affect: Mood normal.    .. Results for orders placed or performed in visit on 04/08/21  POCT Influenza A/B  Result Value Ref Range   Influenza A, POC Negative Negative   Influenza B, POC Positive (A)  Negative          Assessment & Plan:  [name] KitchenMarland KitchenMonna was seen today for sinus problem.  Diagnoses and all orders for this visit:  Influenza B -     oseltamivir (TAMIFLU) 75 MG capsule; Take 1 capsule (75 mg total) by mouth 2 (two) times daily. -     ketorolac (TORADOL) injection 60 mg  Acute cough -     POCT Influenza A/B -     Novel Coronavirus, NAA ([facility]) -     ketorolac (TORADOL) injection 60 mg  Sinus pressure -     POCT Influenza A/B -     Novel Coronavirus, NAA ([facility]) -     ketorolac (TORADOL) injection 60 mg  Body aches -     POCT Influenza A/B -     Novel Coronavirus, NAA ([facility]) -     ketorolac (TORADOL) injection 60 mg   Flu B positive Tamiflu sent Discussed symptomatic care Toradol given for body aches since pt cannot take oral NsAIDs Continue to use tylenol for fever reduction Stay hydrated Follow up as needed or if symptoms worsen

## 2021-04-09 LAB — NOVEL CORONAVIRUS, NAA: SARS-CoV-2, NAA: NOT DETECTED

## 2021-04-09 LAB — SARS-COV-2, NAA 2 DAY TAT

## 2021-04-10 NOTE — Progress Notes (Signed)
Negative for covid. How are you feeling?

## 2021-04-14 DIAGNOSIS — F332 Major depressive disorder, recurrent severe without psychotic features: Secondary | ICD-10-CM | POA: Diagnosis not present

## 2021-04-14 DIAGNOSIS — R69 Illness, unspecified: Secondary | ICD-10-CM | POA: Diagnosis not present

## 2021-04-21 DIAGNOSIS — R69 Illness, unspecified: Secondary | ICD-10-CM | POA: Diagnosis not present

## 2021-04-21 DIAGNOSIS — F332 Major depressive disorder, recurrent severe without psychotic features: Secondary | ICD-10-CM | POA: Diagnosis not present

## 2021-04-28 DIAGNOSIS — R69 Illness, unspecified: Secondary | ICD-10-CM | POA: Diagnosis not present

## 2021-04-28 DIAGNOSIS — F332 Major depressive disorder, recurrent severe without psychotic features: Secondary | ICD-10-CM | POA: Diagnosis not present

## 2021-05-02 ENCOUNTER — Other Ambulatory Visit: Payer: Self-pay | Admitting: Physician Assistant

## 2021-05-02 DIAGNOSIS — Z20822 Contact with and (suspected) exposure to covid-19: Secondary | ICD-10-CM

## 2021-05-02 DIAGNOSIS — R059 Cough, unspecified: Secondary | ICD-10-CM

## 2021-05-08 DIAGNOSIS — M79672 Pain in left foot: Secondary | ICD-10-CM | POA: Diagnosis not present

## 2021-05-08 DIAGNOSIS — S9032XA Contusion of left foot, initial encounter: Secondary | ICD-10-CM | POA: Diagnosis not present

## 2021-05-12 DIAGNOSIS — R69 Illness, unspecified: Secondary | ICD-10-CM | POA: Diagnosis not present

## 2021-05-12 DIAGNOSIS — F332 Major depressive disorder, recurrent severe without psychotic features: Secondary | ICD-10-CM | POA: Diagnosis not present

## 2021-05-14 ENCOUNTER — Other Ambulatory Visit: Payer: Self-pay | Admitting: Physician Assistant

## 2021-05-19 DIAGNOSIS — R69 Illness, unspecified: Secondary | ICD-10-CM | POA: Diagnosis not present

## 2021-05-19 DIAGNOSIS — F332 Major depressive disorder, recurrent severe without psychotic features: Secondary | ICD-10-CM | POA: Diagnosis not present

## 2021-05-26 DIAGNOSIS — R69 Illness, unspecified: Secondary | ICD-10-CM | POA: Diagnosis not present

## 2021-05-26 DIAGNOSIS — F332 Major depressive disorder, recurrent severe without psychotic features: Secondary | ICD-10-CM | POA: Diagnosis not present

## 2021-05-28 DIAGNOSIS — F339 Major depressive disorder, recurrent, unspecified: Secondary | ICD-10-CM | POA: Diagnosis not present

## 2021-05-28 DIAGNOSIS — R69 Illness, unspecified: Secondary | ICD-10-CM | POA: Diagnosis not present

## 2021-06-02 DIAGNOSIS — F332 Major depressive disorder, recurrent severe without psychotic features: Secondary | ICD-10-CM | POA: Diagnosis not present

## 2021-06-02 DIAGNOSIS — R69 Illness, unspecified: Secondary | ICD-10-CM | POA: Diagnosis not present

## 2021-06-05 ENCOUNTER — Other Ambulatory Visit: Payer: Self-pay

## 2021-06-05 ENCOUNTER — Ambulatory Visit (INDEPENDENT_AMBULATORY_CARE_PROVIDER_SITE_OTHER): Payer: 59 | Admitting: Medical-Surgical

## 2021-06-05 ENCOUNTER — Encounter: Payer: Self-pay | Admitting: Medical-Surgical

## 2021-06-05 VITALS — BP 123/76 | HR 65 | Resp 20 | Ht 65.0 in | Wt 192.0 lb

## 2021-06-05 DIAGNOSIS — K644 Residual hemorrhoidal skin tags: Secondary | ICD-10-CM | POA: Diagnosis not present

## 2021-06-05 NOTE — Progress Notes (Signed)
?  HPI with pertinent ROS:  ? ?CC: Possible hemorrhoid ? ?HPI: ?Pleasant 51 year old female presenting today for evaluation of a potential hemorrhoid.  Notes that she has recently become sexually active after 3 years of abstinence and she and her partner have practiced anal insertive intercourse.  Since then, she has had some extra tissue just outside of the anus that does not feel normal.  She is not having any bleeding, irritation, or pain in the area.  Having regular bowel movements without straining.  Uses a bidet attachment to help ensure cleanliness.  Has tried soaking cotton balls with witch hazel to apply to the area but is not sure this is helped at all. ? ?I reviewed the past medical history, family history, social history, surgical history, and allergies today and no changes were needed.  Please see the problem list section below in epic for further details. ? ? ?Physical exam:  ? ?General: Well Developed, well nourished, and in no acute distress.  ?Neuro: Alert and oriented x3.  ?HEENT: Normocephalic, atraumatic.  ?Skin: Warm and dry. ?Cardiac: Regular rate and rhythm, no murmurs rubs or gallops, no lower extremity edema.  ?Respiratory: Clear to auscultation bilaterally. Not using accessory muscles, speaking in full sentences. ?Rectal exam: no tenderness noted, external hemorrhoids noted. ? ?Impression and Recommendations:   ? ?1. External hemorrhoid ?Small external hemorrhoid noted without evidence of thrombosing or inflammation.  Printed information given with AVS on external hemorrhoids.  Discussed management and prevention of worsening of her symptoms.  At this point, would recommend avoiding anal intercourse as this may worsen her current hemorrhoid with inflammation or increased size.  Patient verbalized understanding of instructions.  Advised that she can try Preparation H to see if it would shrink the size of the hemorrhoid. ? ?Return if symptoms worsen or fail to  improve. ?___________________________________________ ?Clearnce Sorrel, DNP, APRN, FNP-BC ?Primary Care and Sports Medicine ?[place] ?

## 2021-06-05 NOTE — Patient Instructions (Signed)
Hemorrhoids °Hemorrhoids are swollen veins in and around the rectum or anus. There are two types of hemorrhoids: °Internal hemorrhoids. These occur in the veins that are just inside the rectum. They may poke through to the outside and become irritated and painful. °External hemorrhoids. These occur in the veins that are outside the anus and can be felt as a painful swelling or hard lump near the anus. °Most hemorrhoids do not cause serious problems, and they can be managed with home treatments such as diet and lifestyle changes. If home treatments do not help the symptoms, procedures can be done to shrink or remove the hemorrhoids. °What are the causes? °This condition is caused by increased pressure in the anal area. This pressure may result from various things, including: °Constipation. °Straining to have a bowel movement. °Diarrhea. °Pregnancy. °Obesity. °Sitting for long periods of time. °Heavy lifting or other activity that causes you to strain. °Anal sex. °Riding a bike for a long period of time. °What are the signs or symptoms? °Symptoms of this condition include: °Pain. °Anal itching or irritation. °Rectal bleeding. °Leakage of stool (feces). °Anal swelling. °One or more lumps around the anus. °How is this diagnosed? °This condition can often be diagnosed through a visual exam. Other exams or tests may also be done, such as: °An exam that involves feeling the rectal area with a gloved hand (digital rectal exam). °An exam of the anal canal that is done using a small tube (anoscope). °A blood test, if you have lost a significant amount of blood. °A test to look inside the colon using a flexible tube with a camera on the end (sigmoidoscopy or colonoscopy). °How is this treated? °This condition can usually be treated at home. However, various procedures may be done if dietary changes, lifestyle changes, and other home treatments do not help your symptoms. These procedures can help make the hemorrhoids smaller or  remove them completely. Some of these procedures involve surgery, and others do not. Common procedures include: °Rubber band ligation. Rubber bands are placed at the base of the hemorrhoids to cut off their blood supply. °Sclerotherapy. Medicine is injected into the hemorrhoids to shrink them. °Infrared coagulation. A type of light energy is used to get rid of the hemorrhoids. °Hemorrhoidectomy surgery. The hemorrhoids are surgically removed, and the veins that supply them are tied off. °Stapled hemorrhoidopexy surgery. The surgeon staples the base of the hemorrhoid to the rectal wall. °Follow these instructions at home: °Eating and drinking ° °Eat foods that have a lot of fiber in them, such as whole grains, beans, nuts, fruits, and vegetables. °Ask your health care provider about taking products that have added fiber (fiber supplements). °Reduce the amount of fat in your diet. You can do this by eating low-fat dairy products, eating less red meat, and avoiding processed foods. °Drink enough fluid to keep your urine pale yellow. °Managing pain and swelling ° °Take warm sitz baths for 20 minutes, 3-4 times a day to ease pain and discomfort. You may do this in a bathtub or using a portable sitz bath that fits over the toilet. °If directed, apply ice to the affected area. Using ice packs between sitz baths may be helpful. °Put ice in a plastic bag. °Place a towel between your skin and the bag. °Leave the ice on for 20 minutes, 2-3 times a day. °General instructions °Take over-the-counter and prescription medicines only as told by your health care provider. °Use medicated creams or suppositories as told. °Get regular exercise.   Ask your health care provider how much and what kind of exercise is best for you. In general, you should do moderate exercise for at least 30 minutes on most days of the week (150 minutes each week). This can include activities such as walking, biking, or yoga. °Go to the bathroom when you have  the urge to have a bowel movement. Do not wait. °Avoid straining to have bowel movements. °Keep the anal area dry and clean. Use wet toilet paper or moist towelettes after a bowel movement. °Do not sit on the toilet for long periods of time. This increases blood pooling and pain. °Keep all follow-up visits as told by your health care provider. This is important. °Contact a health care provider if you have: °Increasing pain and swelling that are not controlled by treatment or medicine. °Difficulty having a bowel movement, or you are unable to have a bowel movement. °Pain or inflammation outside the area of the hemorrhoids. °Get help right away if you have: °Uncontrolled bleeding from your rectum. °Summary °Hemorrhoids are swollen veins in and around the rectum or anus. °Most hemorrhoids can be managed with home treatments such as diet and lifestyle changes. °Taking warm sitz baths can help ease pain and discomfort. °In severe cases, procedures or surgery can be done to shrink or remove the hemorrhoids. °This information is not intended to replace advice given to you by your health care provider. Make sure you discuss any questions you have with your health care provider. °Document Revised: 09/17/2020 Document Reviewed: 09/17/2020 °Elsevier Patient Education © 2022 Elsevier Inc. ° °

## 2021-06-05 NOTE — Addendum Note (Signed)
Addended by: Beverlee Nims on: 06/05/2021 02:28 PM ? ? Modules accepted: Orders ? ?

## 2021-06-07 ENCOUNTER — Other Ambulatory Visit: Payer: Self-pay | Admitting: Physician Assistant

## 2021-06-07 DIAGNOSIS — G43801 Other migraine, not intractable, with status migrainosus: Secondary | ICD-10-CM

## 2021-06-09 DIAGNOSIS — R69 Illness, unspecified: Secondary | ICD-10-CM | POA: Diagnosis not present

## 2021-06-09 DIAGNOSIS — F332 Major depressive disorder, recurrent severe without psychotic features: Secondary | ICD-10-CM | POA: Diagnosis not present

## 2021-06-23 DIAGNOSIS — R69 Illness, unspecified: Secondary | ICD-10-CM | POA: Diagnosis not present

## 2021-06-23 DIAGNOSIS — F332 Major depressive disorder, recurrent severe without psychotic features: Secondary | ICD-10-CM | POA: Diagnosis not present

## 2021-06-29 ENCOUNTER — Other Ambulatory Visit: Payer: Self-pay | Admitting: Physician Assistant

## 2021-06-30 DIAGNOSIS — R69 Illness, unspecified: Secondary | ICD-10-CM | POA: Diagnosis not present

## 2021-06-30 DIAGNOSIS — F332 Major depressive disorder, recurrent severe without psychotic features: Secondary | ICD-10-CM | POA: Diagnosis not present

## 2021-07-17 DIAGNOSIS — F332 Major depressive disorder, recurrent severe without psychotic features: Secondary | ICD-10-CM | POA: Diagnosis not present

## 2021-07-17 DIAGNOSIS — R69 Illness, unspecified: Secondary | ICD-10-CM | POA: Diagnosis not present

## 2021-07-20 ENCOUNTER — Other Ambulatory Visit: Payer: Self-pay | Admitting: Neurology

## 2021-07-21 DIAGNOSIS — F332 Major depressive disorder, recurrent severe without psychotic features: Secondary | ICD-10-CM | POA: Diagnosis not present

## 2021-07-21 DIAGNOSIS — R69 Illness, unspecified: Secondary | ICD-10-CM | POA: Diagnosis not present

## 2021-07-28 ENCOUNTER — Other Ambulatory Visit (HOSPITAL_COMMUNITY): Payer: Self-pay

## 2021-07-28 ENCOUNTER — Telehealth (HOSPITAL_COMMUNITY): Payer: Self-pay | Admitting: Pharmacy Technician

## 2021-07-28 DIAGNOSIS — F332 Major depressive disorder, recurrent severe without psychotic features: Secondary | ICD-10-CM | POA: Diagnosis not present

## 2021-07-28 DIAGNOSIS — R69 Illness, unspecified: Secondary | ICD-10-CM | POA: Diagnosis not present

## 2021-07-28 NOTE — Telephone Encounter (Signed)
Patient Advocate Encounter ?  ?Received notification that prior authorization for Qulipta '60MG'$  tablets is required. ?  ?PA submitted on 07/28/2021 ?Key [id number] ?Status is pending ?   ? ? ? ?Lyndel Safe, CPhT ?Pharmacy Patient Advocate Specialist ?[facility] Advocate Team ?Direct Number: [phone]  Fax: [phone]  ?

## 2021-07-29 NOTE — Telephone Encounter (Signed)
Patient Advocate Encounter ? ?Received notification that the request for prior authorization for Qulipta '60MG'$  tablets has been denied due to it being Non Formulary.  Will cover Ajovy and Emgality.  . Formulary alternatives may require a prior authorization   ?  ?  ? ?Lyndel Safe, CPhT ?Pharmacy Patient Advocate Specialist ?[facility] Patient Advocate Team ?Direct Number: [phone]  Fax: [phone]  ?

## 2021-07-29 NOTE — Telephone Encounter (Signed)
Patient advised of the PA notes, Pt will see what happens and let us know if there is a problem getting her meds.  ?

## 2021-07-31 ENCOUNTER — Other Ambulatory Visit: Payer: Self-pay | Admitting: Physician Assistant

## 2021-07-31 DIAGNOSIS — Z20822 Contact with and (suspected) exposure to covid-19: Secondary | ICD-10-CM

## 2021-07-31 DIAGNOSIS — R059 Cough, unspecified: Secondary | ICD-10-CM

## 2021-08-03 ENCOUNTER — Telehealth: Payer: Self-pay | Admitting: Neurology

## 2021-08-03 ENCOUNTER — Telehealth (HOSPITAL_COMMUNITY): Payer: Self-pay | Admitting: Pharmacy Technician

## 2021-08-03 ENCOUNTER — Other Ambulatory Visit (HOSPITAL_COMMUNITY): Payer: Self-pay

## 2021-08-03 DIAGNOSIS — G43709 Chronic migraine without aura, not intractable, without status migrainosus: Secondary | ICD-10-CM

## 2021-08-03 MED ORDER — EMGALITY 120 MG/ML ~~LOC~~ SOAJ: 240.0000 mg | Freq: Once | SUBCUTANEOUS | 0 refills | Status: AC

## 2021-08-03 MED ORDER — EMGALITY 120 MG/ML ~~LOC~~ SOAJ: 120.0000 mg | SUBCUTANEOUS | 5 refills | Status: DC

## 2021-08-03 NOTE — Telephone Encounter (Signed)
Patient Advocate Encounter ?  ?Received notification that prior authorization for Emgality '120MG'$ /ML auto-injectors (migraine) is required. ?  ?PA submitted on 08/03/2021 ?Key [id number] ?Status is pending ?   ? ? ? ?Lyndel Safe, CPhT ?Pharmacy Patient Advocate Specialist ?[facility] Advocate Team ?Direct Number: [phone]  Fax: [phone]  ?

## 2021-08-03 NOTE — Telephone Encounter (Signed)
Patient called and stated she needs someone to call her about her medication Qulipta.  She stated it was denied by her insurance , she needs to know the reason.  She states she is completely out of medication.  She was very adament about a return phone call this morning. ?

## 2021-08-03 NOTE — Telephone Encounter (Signed)
Patient upset, Patient out of Qulipta. Advised by [employer] they will need the letter stating that her insurance will not cover it. ? ?Patient on hold with her insurance while on the phone with me. Rep for [employer] advised patient if she gets a script for [employer] she will need a PA as will. As well as we never sent in clinicals showing that the patient has tried and failed any medications. ? ?Advised the Patient we send in Clinical notes and per Dr.Jaffe last ov notes he has listed all medications tried and failed in the CGRP Class. Patient states we need to make sure if has Rizatriptan on the list as well.  ? ? ?Patient wants to know she should do. ? ?Advised patient today as well as Friday when I call ed to advised of the Insurance denial, She can get samples from the office and see what [employer] will do. ?If they give patient a hard time please call and let me know and I will ask Dr.Jaffe to change medication to the Alteratives and she may have samples of Qulitpa to hold her over if she is unable to pick Emgality at the pharmacy same day.. ? ?Per Patient on 07/31/21 she will wait until [employer] let her know they will not cover it then she will call us to see what she can do.  ? ? ?Patient still upset, We need to get the PA done today and send something in for her since she out of the [place]. In yelling tone. ? ?Advised patient again that she can come by and pick up samples and we will let the PA team know of the PA needed for Emglatiy. And it may take up to 72 hours approval ? ?Patient states we need to give her enough samples  to last until the PA is approved. Advised patient we will give her a couple of weeks worth of Qulpita. ?

## 2021-08-04 ENCOUNTER — Encounter: Payer: Self-pay | Admitting: Neurology

## 2021-08-04 ENCOUNTER — Other Ambulatory Visit (HOSPITAL_COMMUNITY): Payer: Self-pay

## 2021-08-04 ENCOUNTER — Telehealth: Payer: Self-pay

## 2021-08-04 ENCOUNTER — Telehealth (HOSPITAL_COMMUNITY): Payer: Self-pay | Admitting: Pharmacy Technician

## 2021-08-04 ENCOUNTER — Other Ambulatory Visit: Payer: Self-pay

## 2021-08-04 DIAGNOSIS — F332 Major depressive disorder, recurrent severe without psychotic features: Secondary | ICD-10-CM | POA: Diagnosis not present

## 2021-08-04 DIAGNOSIS — R69 Illness, unspecified: Secondary | ICD-10-CM | POA: Diagnosis not present

## 2021-08-04 MED ORDER — AIMOVIG 140 MG/ML ~~LOC~~ SOAJ: 140.0000 mg | SUBCUTANEOUS | 5 refills | Status: DC

## 2021-08-04 NOTE — Telephone Encounter (Signed)
Patient Advocate Encounter ?  ?Received notification  that prior authorization for Aimovig '140MG'$ /ML auto-injectors is required. ?  ?PA submitted on 08/04/2021 ?Key [id number] ?Status is pending ?   ? ? ? ?Lyndel Safe, CPhT ?Pharmacy Patient Advocate Specialist ?[facility] Advocate Team ?Direct Number: [phone]  Fax: [phone]  ?

## 2021-08-04 NOTE — Telephone Encounter (Signed)
Patient Advocate Encounter ? ?Received notification that the request for prior authorization for Emgality '120MG'$ /ML auto-injectors (migraine) has been denied due to Formulary alternative(s) are Aimovig.   ?  ? ? ?Lyndel Safe, CPhT ?Pharmacy Patient Advocate Specialist ?[facility] Patient Advocate Team ?Direct Number: [phone]  Fax: [phone]  ?

## 2021-08-04 NOTE — Telephone Encounter (Signed)
LMOVM for pt, Per PA team emgality not covered but they will cover Aimovig. ? ?PA done for Aimovig and it was approved.  ?

## 2021-08-04 NOTE — Telephone Encounter (Signed)
Patient Advocate Encounter ? ?Prior Authorization for Aimovig '140MG'$ /ML auto-injectors has been approved.   ? ?PA# [id number] ?Effective dates: 08/04/2021 through 08/05/2022 ? ? ? ? ? ?Lyndel Safe, CPhT ?Pharmacy Patient Advocate Specialist ?[facility] Advocate Team ?Direct Number: [phone]  Fax: [phone]  ?

## 2021-08-05 ENCOUNTER — Other Ambulatory Visit (HOSPITAL_COMMUNITY): Payer: Self-pay

## 2021-08-05 NOTE — Telephone Encounter (Signed)
Patient advised.

## 2021-08-05 NOTE — Telephone Encounter (Signed)
I called [employer] yesterday (08/04/2021) they told me to resubmit prior Authorization for Quilipta 60 mg tablets.  We did resubmit and they Denied it again this morning saying patient must use Aimovig first.   ?If Aimovig does not work we can resubmit next month for the Quilipta since she would have tried the Aimovig by then. ? ?Lyndel Safe, CPHT ?Pharmacy Patient Advocate Specialist ?[facility] Patient Advocate Team ?Direct Number: [phone]  Fax: [phone] ? ?  ?

## 2021-08-08 ENCOUNTER — Encounter: Payer: Self-pay | Admitting: Physician Assistant

## 2021-08-08 ENCOUNTER — Telehealth: Payer: 59 | Admitting: Physician Assistant

## 2021-08-08 DIAGNOSIS — U071 COVID-19: Secondary | ICD-10-CM | POA: Diagnosis not present

## 2021-08-08 MED ORDER — NIRMATRELVIR/RITONAVIR (PAXLOVID)TABLET: 3.0000 | ORAL_TABLET | Freq: Two times a day (BID) | ORAL | 0 refills | Status: AC

## 2021-08-08 NOTE — Progress Notes (Signed)
Virtual Visit Consent   Julie Johnston, you are scheduled for a virtual visit with a [facility] provider today. Just as with appointments in the office, your consent must be obtained to participate. Your consent will be active for this visit and any virtual visit you may have with one of our providers in the next 365 days. If you have a MyChart account, a copy of this consent can be sent to you electronically.  As this is a virtual visit, video technology does not allow for your provider to perform a traditional examination. This may limit your provider's ability to fully assess your condition. If your provider identifies any concerns that need to be evaluated in person or the need to arrange testing (such as labs, EKG, etc.), we will make arrangements to do so. Although advances in technology are sophisticated, we cannot ensure that it will always work on either your end or our end. If the connection with a video visit is poor, the visit may have to be switched to a telephone visit. With either a video or telephone visit, we are not always able to ensure that we have a secure connection.  By engaging in this virtual visit, you consent to the provision of healthcare and authorize for your insurance to be billed (if applicable) for the services provided during this visit. Depending on your insurance coverage, you may receive a charge related to this service.  I need to obtain your verbal consent now. Are you willing to proceed with your visit today? Julie Johnston has provided verbal consent on 08/08/2021 for a virtual visit (video or telephone). Inda Coke, [place]  Date: 08/08/2021 1:33 PM  Virtual Visit via Video Note   I, Inda Coke, connected with  Julie Johnston  ([id number], [birth year]) on 08/08/21 at  1:45 PM EDT by a video-enabled telemedicine application and verified that I am speaking with the correct person using two identifiers.  Location: Patient: Virtual Visit Location Patient:  Home Provider: Virtual Visit Location Provider: Home Office   I discussed the limitations of evaluation and management by telemedicine and the availability of in person appointments. The patient expressed understanding and agreed to proceed.    History of Present Illness: Julie Johnston is a 51 y.o. who identifies as a female who was assigned female at birth, and is being seen today for COVID-19 illness.  Symptoms started yesterday. Temperature is in the 100's today. Has never had COVID. Has had all vaccinations and boosters. 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 Has not taken anything for her symptoms.  Denies: chest pain, SOB  HPI: HPI  Problems:  Patient Active Problem List   Diagnosis Date Noted   Elevated liver enzymes 12/17/2020   Intractable migraine with aura with status migrainosus 56/31/4970   Non-alcoholic fatty liver disease 11/25/2020   Current hepatitis A infection 11/25/2020   S/P laparoscopic sleeve gastrectomy 11/18/2020   Paresthesia 11/18/2020   Foreign body (FB) in soft tissue 09/19/2020   Ear fullness, right 08/14/2020   Parotiditis 08/14/2020   Parotid swelling 08/13/2020   Post-concussion headache 08/04/2020   Acute intractable headache 08/01/2020   Head injury 08/01/2020   Unsteadiness on feet 06/02/2020   Other abnormalities of gait and mobility 06/02/2020   Controlled type 2 diabetes mellitus with complication, without long-term current use of insulin ([facility]) 05/27/2020   Severe obesity (BMI >= 40) ([facility]) 05/27/2020   Dizziness 05/19/2020   Double vision 05/19/2020   Vertigo 05/19/2020  Polyp of transverse colon 02/25/2020   Diverticulosis of colon 02/25/2020   Internal hemorrhoids 02/25/2020   Generalized anxiety disorder 11/07/2019   Right posterior sixth rib fracture 10/02/2019   Post concussion syndrome 10/01/2019   Acute thoracic back pain 10/01/2019   PTSD (post-traumatic stress disorder) 10/01/2019   Mild traumatic  brain injury 09/12/2019   Essential hypertension 06/04/2019   Influenza-like illness 07/20/2018   Equivocal stress echocardiogram 05/17/2018   Frequent infections 05/08/2018   Decreased thyroid stimulating hormone (TSH) level 05/08/2018   Potential exposure to STD 12/16/2017   Migraine 11/29/2017   Dysuria 11/29/2017   Drug-induced weight gain 02/23/2016   Urine ketones 09/22/2015   Fibromyalgia 09/22/2015   Obesity 09/22/2015   Obstructive sleep apnea 06/18/2015   Pre-diabetes 05/14/2015   Lyme disease 05/09/2015   Insomnia 05/09/2015   Palpitations 05/09/2015   Chronic fatigue 05/09/2015   No energy 05/09/2015   Hyperlipidemia 05/09/2015   Attention deficit hyperactivity disorder (ADHD), predominantly inattentive type 05/09/2015   Major depressive disorder    Pulmonary collapse 06/10/2014   Pap smear abnormality of cervix with LGSIL 02/07/2014    Allergies:  Allergies  Allergen Reactions   Belviq [Lorcaserin Hcl]     Made eyes crossed.    Medications:  Current Outpatient Medications:    nirmatrelvir/ritonavir EUA (PAXLOVID) 20 x 150 MG & 10 x '100MG'$  TABS, Take 3 tablets by mouth 2 (two) times daily for 5 days. (Take nirmatrelvir 150 mg two tablets twice daily for 5 days and ritonavir 100 mg one tablet twice daily for 5 days), Disp: 30 tablet, Rfl: 0   rizatriptan (MAXALT-MLT) 10 MG disintegrating tablet, TAKE 1 TABLET BY MOUTH AS NEEDED FOR MIGRAINE. MAY REPEAT IN 2 HOURS IF NEEDED, Disp: 12 tablet, Rfl: 3   albuterol (VENTOLIN HFA) 108 (90 Base) MCG/ACT inhaler, TAKE 2 PUFFS BY MOUTH EVERY 6 HOURS AS NEEDED FOR WHEEZE OR SHORTNESS OF BREATH, Disp: 18 each, Rfl: 1   celecoxib (CELEBREX) 200 MG capsule, TAKE 1 TO 2 CAPSULES BY MOUTH DAILY AS NEEDED FOR PAIN., Disp: 60 capsule, Rfl: 2   Erenumab-aooe (AIMOVIG) 140 MG/ML SOAJ, Inject 140 mg into the skin every 30 (thirty) days., Disp: 1.12 mL, Rfl: 5   fluticasone (FLONASE) 50 MCG/ACT nasal spray, SPRAY 2 SPRAYS INTO EACH NOSTRIL  EVERY DAY, Disp: 48 mL, Rfl: 0   gabapentin (NEURONTIN) 800 MG tablet, TAKE 1 TABLET BY MOUTH THREE TIMES A DAY, Disp: 90 tablet, Rfl: 0   Galcanezumab-gnlm (EMGALITY) 120 MG/ML SOAJ, Inject 120 mg into the skin every 30 (thirty) days., Disp: 1.12 mL, Rfl: 5   norethindrone (MICRONOR) 0.35 MG tablet, Take 1 tablet by mouth daily., Disp: , Rfl:    oseltamivir (TAMIFLU) 75 MG capsule, Take 1 capsule (75 mg total) by mouth 2 (two) times daily., Disp: 10 capsule, Rfl: 0   rOPINIRole (REQUIP) 0.25 MG tablet, Take 1-2 tablets as needed up to three times a day., Disp: 90 tablet, Rfl: 0   selegiline (EMSAM) 9 MG/24HR, Place 9 mg onto the skin daily., Disp: , Rfl:    traZODone (DESYREL) 100 MG tablet, Take 100 mg by mouth at bedtime as needed., Disp: , Rfl:    traZODone (DESYREL) 50 MG tablet, Take 50 mg by mouth at bedtime., Disp: , Rfl:    valACYclovir (VALTREX) 1000 MG tablet, Take 2 tablets (2,000 mg total) by mouth 2 (two) times daily. For one day., Disp: 14 tablet, Rfl: 2  Observations/Objective: Patient is well-developed, well-nourished in no acute  distress.  Resting comfortably  at home.  Head is normocephalic, atraumatic.  No labored breathing.  Speech is clear and coherent with logical content.  Patient is alert and oriented at baseline.   Assessment and Plan: 1. COVID-19 No red flags She is in no acute distress Patient is interested in [place]. I have sent this in for patient and also discussed that this medication is still under the FDA EUA and full long-term side effect profile is unknown.  Benefits/risks of medication discussed.  Patient currently has no contraindications for taking this medication -- she states that she is currently not on qulipta We reviewed current medications and most recent GFR.  They are aware of risks of medication and wishes to proceed. I advised that this medication has numerous potential drug interactions and they should confirm with their pharmacist that  this medication is safe to take with their current prescriptions prior to starting. If new/worsening symptoms, needs to follow-up in person at [facility] or ER given complex medical hx  Follow Up Instructions: I discussed the assessment and treatment plan with the patient. The patient was provided an opportunity to ask questions and all were answered. The patient agreed with the plan and demonstrated an understanding of the instructions.  A copy of instructions were sent to the patient via MyChart unless otherwise noted below.   The patient was advised to call back or seek an in-person evaluation if the symptoms worsen or if the condition fails to improve as anticipated.  Time:  I spent 15 minutes with the patient via telehealth technology discussing the above problems/concerns.    Inda Coke, [place]

## 2021-08-11 DIAGNOSIS — F332 Major depressive disorder, recurrent severe without psychotic features: Secondary | ICD-10-CM | POA: Diagnosis not present

## 2021-08-11 DIAGNOSIS — R69 Illness, unspecified: Secondary | ICD-10-CM | POA: Diagnosis not present

## 2021-08-13 DIAGNOSIS — R69 Illness, unspecified: Secondary | ICD-10-CM | POA: Diagnosis not present

## 2021-08-13 DIAGNOSIS — F339 Major depressive disorder, recurrent, unspecified: Secondary | ICD-10-CM | POA: Diagnosis not present

## 2021-08-14 ENCOUNTER — Encounter: Payer: Self-pay | Admitting: Physician Assistant

## 2021-08-14 NOTE — Telephone Encounter (Signed)
She may have to wait until Luvenia Starch is back. Don't see any documentation on her head injury and sxs since 2021

## 2021-08-17 ENCOUNTER — Other Ambulatory Visit: Payer: Self-pay | Admitting: Physician Assistant

## 2021-08-18 DIAGNOSIS — R69 Illness, unspecified: Secondary | ICD-10-CM | POA: Diagnosis not present

## 2021-08-18 DIAGNOSIS — F332 Major depressive disorder, recurrent severe without psychotic features: Secondary | ICD-10-CM | POA: Diagnosis not present

## 2021-08-19 ENCOUNTER — Ambulatory Visit (INDEPENDENT_AMBULATORY_CARE_PROVIDER_SITE_OTHER): Payer: 59 | Admitting: Physician Assistant

## 2021-08-19 ENCOUNTER — Encounter: Payer: Self-pay | Admitting: Physician Assistant

## 2021-08-19 VITALS — BP 127/79 | HR 73 | Temp 98.6°F | Ht 65.0 in | Wt 183.0 lb

## 2021-08-19 DIAGNOSIS — U071 COVID-19: Secondary | ICD-10-CM

## 2021-08-19 DIAGNOSIS — R69 Illness, unspecified: Secondary | ICD-10-CM | POA: Diagnosis not present

## 2021-08-19 DIAGNOSIS — Z113 Encounter for screening for infections with a predominantly sexual mode of transmission: Secondary | ICD-10-CM

## 2021-08-19 DIAGNOSIS — J0140 Acute pansinusitis, unspecified: Secondary | ICD-10-CM | POA: Diagnosis not present

## 2021-08-19 DIAGNOSIS — R8290 Unspecified abnormal findings in urine: Secondary | ICD-10-CM

## 2021-08-19 DIAGNOSIS — N898 Other specified noninflammatory disorders of vagina: Secondary | ICD-10-CM

## 2021-08-19 DIAGNOSIS — R058 Other specified cough: Secondary | ICD-10-CM | POA: Diagnosis not present

## 2021-08-19 LAB — POCT URINALYSIS DIP (CLINITEK)
Bilirubin, UA: NEGATIVE
Blood, UA: NEGATIVE
Glucose, UA: NEGATIVE mg/dL
Ketones, POC UA: NEGATIVE mg/dL
Leukocytes, UA: NEGATIVE
Nitrite, UA: NEGATIVE
Spec Grav, UA: 1.025 (ref 1.010–1.025)
Urobilinogen, UA: 1 E.U./dL
pH, UA: 5.5 (ref 5.0–8.0)

## 2021-08-19 MED ORDER — PREDNISONE 50 MG PO TABS: ORAL_TABLET | ORAL | 0 refills | Status: DC

## 2021-08-19 MED ORDER — FLUCONAZOLE 150 MG PO TABS
150.0000 mg | ORAL_TABLET | Freq: Once | ORAL | 0 refills | Status: AC
Start: 2021-08-19 — End: 2021-08-19

## 2021-08-19 MED ORDER — AMOXICILLIN-POT CLAVULANATE 875-125 MG PO TABS: 1.0000 | ORAL_TABLET | Freq: Two times a day (BID) | ORAL | 0 refills | Status: DC

## 2021-08-19 NOTE — Progress Notes (Signed)
   Acute Office Visit  Subjective:     Patient ID: Julie Johnston, female    DOB: 1970-07-21, 52 y.o.   MRN: [id number]  Chief Complaint  Patient presents with  . Cough  . Generalized Body Aches    HPI Patient is in today for cough, body aches, sinus pressure, sinus congestion after covid infection and completion of paxlovid 8 days ago.   Covid-paxlovid 2 weeks done 8 days.  Coughing   Itchy and burning urine odor  Bloody dischare    ROS      Objective:    BP 127/79   Pulse 73   Temp 98.6 F (37 C) (Oral)   Ht '5\' 5"'$  (1.651 m)   Wt 183 lb (83 kg)   SpO2 100%   BMI 30.45 kg/m  BP Readings from Last 3 Encounters:  08/19/21 127/79  06/05/21 123/76  04/08/21 133/77   Wt Readings from Last 3 Encounters:  08/19/21 183 lb (83 kg)  06/05/21 192 lb (87.1 kg)  04/08/21 197 lb (89.4 kg)      Physical Exam        Assessment & Plan:     No orders of the defined types were placed in this encounter.   No follow-ups on file.  Iran Planas, PA-C

## 2021-08-19 NOTE — Telephone Encounter (Signed)
Per Luvenia Starch ok for jury duty note

## 2021-08-20 ENCOUNTER — Encounter: Payer: Self-pay | Admitting: Physician Assistant

## 2021-08-20 LAB — SURESWAB® ADVANCED VAGINITIS PLUS,TMA
C. trachomatis RNA, TMA: NOT DETECTED
CANDIDA SPECIES: DETECTED — AB
Candida glabrata: NOT DETECTED
N. gonorrhoeae RNA, TMA: NOT DETECTED
SURESWAB(R) ADV BACTERIAL VAGINOSIS(BV),TMA: NEGATIVE
TRICHOMONAS VAGINALIS (TV),TMA: NOT DETECTED

## 2021-08-21 LAB — URINE CULTURE
MICRO NUMBER:: 13468826
Result:: NO GROWTH
SPECIMEN QUALITY:: ADEQUATE

## 2021-08-21 NOTE — Progress Notes (Signed)
No bacterial growth on Urine culture. Yeast detected. Diflucan given that should treat.

## 2021-08-25 DIAGNOSIS — R69 Illness, unspecified: Secondary | ICD-10-CM | POA: Diagnosis not present

## 2021-08-25 DIAGNOSIS — F332 Major depressive disorder, recurrent severe without psychotic features: Secondary | ICD-10-CM | POA: Diagnosis not present

## 2021-08-28 DIAGNOSIS — R42 Dizziness and giddiness: Secondary | ICD-10-CM | POA: Diagnosis not present

## 2021-09-01 DIAGNOSIS — R69 Illness, unspecified: Secondary | ICD-10-CM | POA: Diagnosis not present

## 2021-09-01 DIAGNOSIS — F332 Major depressive disorder, recurrent severe without psychotic features: Secondary | ICD-10-CM | POA: Diagnosis not present

## 2021-09-08 DIAGNOSIS — R69 Illness, unspecified: Secondary | ICD-10-CM | POA: Diagnosis not present

## 2021-09-08 DIAGNOSIS — F332 Major depressive disorder, recurrent severe without psychotic features: Secondary | ICD-10-CM | POA: Diagnosis not present

## 2021-09-15 DIAGNOSIS — R69 Illness, unspecified: Secondary | ICD-10-CM | POA: Diagnosis not present

## 2021-09-15 DIAGNOSIS — F332 Major depressive disorder, recurrent severe without psychotic features: Secondary | ICD-10-CM | POA: Diagnosis not present

## 2021-09-17 DIAGNOSIS — G8929 Other chronic pain: Secondary | ICD-10-CM | POA: Diagnosis not present

## 2021-09-17 DIAGNOSIS — R635 Abnormal weight gain: Secondary | ICD-10-CM | POA: Diagnosis not present

## 2021-09-17 DIAGNOSIS — E43 Unspecified severe protein-calorie malnutrition: Secondary | ICD-10-CM | POA: Diagnosis not present

## 2021-09-18 ENCOUNTER — Encounter: Payer: Self-pay | Admitting: Physician Assistant

## 2021-09-18 NOTE — Telephone Encounter (Signed)
LVM to call back to get a nurse visit scheduled this afternoon. AMUCK

## 2021-09-21 ENCOUNTER — Other Ambulatory Visit: Payer: Self-pay | Admitting: Physician Assistant

## 2021-09-23 ENCOUNTER — Telehealth: Payer: Self-pay | Admitting: Family Medicine

## 2021-09-23 NOTE — Telephone Encounter (Signed)
Patient stated she has lost 180 lbs and had blood work done at [facility], @ her bariatric follow up & everything was normal. She stated she didn't think she needed a diabetic f/u but if it is needed then she would be willing to scheduled. AMUCK

## 2021-09-23 NOTE — Telephone Encounter (Signed)
These call patient: Please have her scheduled for diabetes follow-up.  Her diabetes has not been evaluated in over a year and may be increasing her risk for infections etc.

## 2021-09-24 ENCOUNTER — Ambulatory Visit (INDEPENDENT_AMBULATORY_CARE_PROVIDER_SITE_OTHER): Payer: 59 | Admitting: Family Medicine

## 2021-09-24 VITALS — BP 137/78 | HR 78

## 2021-09-24 DIAGNOSIS — R309 Painful micturition, unspecified: Secondary | ICD-10-CM | POA: Diagnosis not present

## 2021-09-24 DIAGNOSIS — R102 Pelvic and perineal pain: Secondary | ICD-10-CM | POA: Diagnosis not present

## 2021-09-24 DIAGNOSIS — N898 Other specified noninflammatory disorders of vagina: Secondary | ICD-10-CM | POA: Diagnosis not present

## 2021-09-24 LAB — POCT URINALYSIS DIP (CLINITEK)
Bilirubin, UA: NEGATIVE
Blood, UA: NEGATIVE
Glucose, UA: NEGATIVE mg/dL
Leukocytes, UA: NEGATIVE
Nitrite, UA: POSITIVE — AB
Spec Grav, UA: 1.025 (ref 1.010–1.025)
Urobilinogen, UA: 1 E.U./dL
pH, UA: 5.5 (ref 5.0–8.0)

## 2021-09-24 MED ORDER — NITROFURANTOIN MONOHYD MACRO 100 MG PO CAPS: 100.0000 mg | ORAL_CAPSULE | Freq: Two times a day (BID) | ORAL | 0 refills | Status: DC

## 2021-09-24 NOTE — Progress Notes (Signed)
   Acute Office Visit  Subjective:     Patient ID: Julie Johnston, female    DOB: 11-03-1970, 51 y.o.   MRN: [id number]  Chief Complaint  Patient presents with   Vaginal Pain    HPI Patient is in today for vaginal pain since May. She has completed a course of Augmentin and 2 rounds of Diflucan with no relief. She is having pain externally all the time, during intercourse, and after urination. No discharge. No rashes. No other complaints. No recent changes in medication except noted above.        Objective:    BP 137/78   Pulse 78   SpO2 100%        Assessment & Plan:   Completed Sure Swab and UA.  Results for orders placed or performed in visit on 09/24/21 (from the past 24 hour(s))  POCT URINALYSIS DIP (CLINITEK)     Status: Abnormal   Collection Time: 09/24/21  2:11 PM  Result Value Ref Range   Color, UA yellow yellow   Clarity, UA clear clear   Glucose, UA negative negative mg/dL   Bilirubin, UA negative negative   Ketones, POC UA trace (5) (A) negative mg/dL   Spec Grav, UA 1.025 1.010 - 1.025   Blood, UA negative negative   pH, UA 5.5 5.0 - 8.0   POC PROTEIN,UA trace negative, trace   Urobilinogen, UA 1.0 0.2 or 1.0 E.U./dL   Nitrite, UA Positive (A) Negative   Leukocytes, UA Negative Negative    Since UA positive, sent for culture. Will contact patient with results.     Wyline Mood, CMA

## 2021-09-24 NOTE — Progress Notes (Signed)
Will send macrobid since nitrates are pos.    Beatrice Lecher, MD

## 2021-09-25 ENCOUNTER — Ambulatory Visit: Payer: 59 | Admitting: Neurology

## 2021-09-26 LAB — SURESWAB® ADVANCED VAGINITIS PLUS,TMA
C. trachomatis RNA, TMA: NOT DETECTED
CANDIDA SPECIES: NOT DETECTED
Candida glabrata: NOT DETECTED
N. gonorrhoeae RNA, TMA: NOT DETECTED
SURESWAB(R) ADV BACTERIAL VAGINOSIS(BV),TMA: NEGATIVE
TRICHOMONAS VAGINALIS (TV),TMA: NOT DETECTED

## 2021-09-26 LAB — URINE CULTURE
MICRO NUMBER:: 13615461
SPECIMEN QUALITY:: ADEQUATE

## 2021-09-28 DIAGNOSIS — M25561 Pain in right knee: Secondary | ICD-10-CM | POA: Diagnosis not present

## 2021-09-28 NOTE — Progress Notes (Signed)
Hi Kate, your urine culture came back for a bacteria called E. coli.  This is one of the most common urinary tract bugs.  The medication that we sent over should take care of it.  It is no if you are still having any problems or symptoms.
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
## 2021-09-29 ENCOUNTER — Encounter: Payer: Self-pay | Admitting: Neurology

## 2021-09-29 ENCOUNTER — Encounter: Payer: Self-pay | Admitting: Family Medicine

## 2021-09-29 ENCOUNTER — Telehealth (INDEPENDENT_AMBULATORY_CARE_PROVIDER_SITE_OTHER): Payer: 59 | Admitting: Neurology

## 2021-09-29 VITALS — Ht <= 58 in | Wt 178.0 lb

## 2021-09-29 DIAGNOSIS — F332 Major depressive disorder, recurrent severe without psychotic features: Secondary | ICD-10-CM | POA: Diagnosis not present

## 2021-09-29 DIAGNOSIS — F411 Generalized anxiety disorder: Secondary | ICD-10-CM | POA: Diagnosis not present

## 2021-09-29 DIAGNOSIS — R4189 Other symptoms and signs involving cognitive functions and awareness: Secondary | ICD-10-CM | POA: Diagnosis not present

## 2021-09-29 DIAGNOSIS — R69 Illness, unspecified: Secondary | ICD-10-CM | POA: Diagnosis not present

## 2021-09-29 DIAGNOSIS — F0781 Postconcussional syndrome: Secondary | ICD-10-CM

## 2021-09-29 DIAGNOSIS — G43009 Migraine without aura, not intractable, without status migrainosus: Secondary | ICD-10-CM

## 2021-10-06 DIAGNOSIS — F332 Major depressive disorder, recurrent severe without psychotic features: Secondary | ICD-10-CM | POA: Diagnosis not present

## 2021-10-06 DIAGNOSIS — R69 Illness, unspecified: Secondary | ICD-10-CM | POA: Diagnosis not present

## 2021-10-13 DIAGNOSIS — F332 Major depressive disorder, recurrent severe without psychotic features: Secondary | ICD-10-CM | POA: Diagnosis not present

## 2021-10-13 DIAGNOSIS — R69 Illness, unspecified: Secondary | ICD-10-CM | POA: Diagnosis not present

## 2021-10-20 DIAGNOSIS — F332 Major depressive disorder, recurrent severe without psychotic features: Secondary | ICD-10-CM | POA: Diagnosis not present

## 2021-10-20 DIAGNOSIS — R69 Illness, unspecified: Secondary | ICD-10-CM | POA: Diagnosis not present

## 2021-10-25 ENCOUNTER — Other Ambulatory Visit: Payer: Self-pay | Admitting: Physician Assistant

## 2021-10-25 DIAGNOSIS — Z20822 Contact with and (suspected) exposure to covid-19: Secondary | ICD-10-CM

## 2021-10-25 DIAGNOSIS — R059 Cough, unspecified: Secondary | ICD-10-CM

## 2021-10-27 DIAGNOSIS — R69 Illness, unspecified: Secondary | ICD-10-CM | POA: Diagnosis not present

## 2021-10-27 DIAGNOSIS — F332 Major depressive disorder, recurrent severe without psychotic features: Secondary | ICD-10-CM | POA: Diagnosis not present

## 2021-11-03 DIAGNOSIS — R69 Illness, unspecified: Secondary | ICD-10-CM | POA: Diagnosis not present

## 2021-11-03 DIAGNOSIS — F332 Major depressive disorder, recurrent severe without psychotic features: Secondary | ICD-10-CM | POA: Diagnosis not present

## 2021-11-10 DIAGNOSIS — F332 Major depressive disorder, recurrent severe without psychotic features: Secondary | ICD-10-CM | POA: Diagnosis not present

## 2021-11-10 DIAGNOSIS — R69 Illness, unspecified: Secondary | ICD-10-CM | POA: Diagnosis not present

## 2021-11-16 ENCOUNTER — Other Ambulatory Visit: Payer: Self-pay | Admitting: Physician Assistant

## 2021-11-16 DIAGNOSIS — R748 Abnormal levels of other serum enzymes: Secondary | ICD-10-CM

## 2021-11-17 ENCOUNTER — Other Ambulatory Visit: Payer: Self-pay | Admitting: Physician Assistant

## 2021-11-17 DIAGNOSIS — F332 Major depressive disorder, recurrent severe without psychotic features: Secondary | ICD-10-CM | POA: Diagnosis not present

## 2021-11-17 DIAGNOSIS — R69 Illness, unspecified: Secondary | ICD-10-CM | POA: Diagnosis not present

## 2021-11-24 DIAGNOSIS — F332 Major depressive disorder, recurrent severe without psychotic features: Secondary | ICD-10-CM | POA: Diagnosis not present

## 2021-11-24 DIAGNOSIS — R69 Illness, unspecified: Secondary | ICD-10-CM | POA: Diagnosis not present

## 2021-11-25 ENCOUNTER — Ambulatory Visit: Payer: 59 | Admitting: Family Medicine

## 2021-11-25 DIAGNOSIS — G4730 Sleep apnea, unspecified: Secondary | ICD-10-CM | POA: Diagnosis not present

## 2021-11-25 DIAGNOSIS — F339 Major depressive disorder, recurrent, unspecified: Secondary | ICD-10-CM | POA: Diagnosis not present

## 2021-11-25 DIAGNOSIS — R69 Illness, unspecified: Secondary | ICD-10-CM | POA: Diagnosis not present

## 2021-12-01 DIAGNOSIS — F332 Major depressive disorder, recurrent severe without psychotic features: Secondary | ICD-10-CM | POA: Diagnosis not present

## 2021-12-01 DIAGNOSIS — R69 Illness, unspecified: Secondary | ICD-10-CM | POA: Diagnosis not present

## 2021-12-08 DIAGNOSIS — R69 Illness, unspecified: Secondary | ICD-10-CM | POA: Diagnosis not present

## 2021-12-08 DIAGNOSIS — F332 Major depressive disorder, recurrent severe without psychotic features: Secondary | ICD-10-CM | POA: Diagnosis not present

## 2021-12-15 DIAGNOSIS — F332 Major depressive disorder, recurrent severe without psychotic features: Secondary | ICD-10-CM | POA: Diagnosis not present

## 2021-12-15 DIAGNOSIS — R69 Illness, unspecified: Secondary | ICD-10-CM | POA: Diagnosis not present

## 2021-12-21 ENCOUNTER — Other Ambulatory Visit: Payer: Self-pay | Admitting: Physician Assistant

## 2021-12-22 DIAGNOSIS — F332 Major depressive disorder, recurrent severe without psychotic features: Secondary | ICD-10-CM | POA: Diagnosis not present

## 2021-12-22 DIAGNOSIS — R69 Illness, unspecified: Secondary | ICD-10-CM | POA: Diagnosis not present

## 2021-12-29 DIAGNOSIS — F332 Major depressive disorder, recurrent severe without psychotic features: Secondary | ICD-10-CM | POA: Diagnosis not present

## 2021-12-29 DIAGNOSIS — R69 Illness, unspecified: Secondary | ICD-10-CM | POA: Diagnosis not present

## 2022-01-05 DIAGNOSIS — F332 Major depressive disorder, recurrent severe without psychotic features: Secondary | ICD-10-CM | POA: Diagnosis not present

## 2022-01-05 DIAGNOSIS — R69 Illness, unspecified: Secondary | ICD-10-CM | POA: Diagnosis not present

## 2022-01-12 DIAGNOSIS — F332 Major depressive disorder, recurrent severe without psychotic features: Secondary | ICD-10-CM | POA: Diagnosis not present

## 2022-01-12 DIAGNOSIS — R69 Illness, unspecified: Secondary | ICD-10-CM | POA: Diagnosis not present

## 2022-01-18 ENCOUNTER — Other Ambulatory Visit: Payer: Self-pay | Admitting: Neurology

## 2022-01-18 ENCOUNTER — Other Ambulatory Visit: Payer: Self-pay | Admitting: Physician Assistant

## 2022-01-19 ENCOUNTER — Encounter: Payer: Self-pay | Admitting: Family Medicine

## 2022-01-19 ENCOUNTER — Ambulatory Visit (INDEPENDENT_AMBULATORY_CARE_PROVIDER_SITE_OTHER): Payer: 59 | Admitting: Family Medicine

## 2022-01-19 VITALS — BP 137/80 | HR 69 | Ht 65.0 in | Wt 174.0 lb

## 2022-01-19 DIAGNOSIS — L03113 Cellulitis of right upper limb: Secondary | ICD-10-CM

## 2022-01-19 MED ORDER — DOXYCYCLINE HYCLATE 100 MG PO TABS: 100.0000 mg | ORAL_TABLET | Freq: Two times a day (BID) | ORAL | 0 refills | Status: DC

## 2022-01-19 NOTE — Progress Notes (Signed)
   Acute Office Visit  Subjective:     Patient ID: Noor Witte, female    DOB: 1970-11-27, 51 y.o.   MRN: [id number]  Chief Complaint  Patient presents with   Rash    Room 12    HPI Patient is in today for concerns of a skin infection. She had biopsy done at the skin center and for the last two months it has been weeping. Does have erythema.   Review of Systems  Constitutional:  Negative for chills and fever.  Respiratory:  Negative for cough and shortness of breath.   Cardiovascular:  Negative for chest pain.  Skin:        Right forearm lesion.  Neurological:  Negative for headaches.        Objective:    BP 137/80   Pulse 69   Ht '5\' 5"'$  (1.651 m)   Wt 174 lb (78.9 kg)   SpO2 100%   BMI 28.96 kg/m    Physical Exam Vitals and nursing note reviewed.  Constitutional:      General: She is not in acute distress.    Appearance: Normal appearance.  HENT:     Head: Normocephalic and atraumatic.     Right Ear: External ear normal.     Left Ear: External ear normal.     Nose: Nose normal.  Eyes:     Conjunctiva/sclera: Conjunctivae normal.  Cardiovascular:     Rate and Rhythm: Normal rate.  Pulmonary:     Effort: Pulmonary effort is normal.  Skin:    Comments: Right forearm has erythematous lesion with scaring in the center. Some warmth associated with the skin.   Neurological:     General: No focal deficit present.     Mental Status: She is alert and oriented to person, place, and time.  Psychiatric:        Mood and Affect: Mood normal.        Behavior: Behavior normal.        Thought Content: Thought content normal.        Judgment: Judgment normal.     No results found for any visits on 01/19/22.      Assessment & Plan:   Problem List Items Addressed This Visit       Other   Cellulitis of right upper extremity - Primary    - pt had procedure done at skin center and presents with cellulitis of R forearm--erythema and oozing of the site - will  go ahead and treat with doxycycline  - did advise pt to contact skin center as the path report in epic shows the area was calcinosis cutis and with its growing size to see if anything else needs to be done.       Relevant Medications   doxycycline (VIBRA-TABS) 100 MG tablet    Meds ordered this encounter  Medications   doxycycline (VIBRA-TABS) 100 MG tablet    Sig: Take 1 tablet (100 mg total) by mouth 2 (two) times daily for 7 days.    Dispense:  14 tablet    Refill:  0    Return with PCP.  Owens Loffler, DO

## 2022-01-19 NOTE — Assessment & Plan Note (Signed)
-   pt had procedure done at skin center and presents with cellulitis of R forearm--erythema and oozing of the site - will go ahead and treat with doxycycline  - did advise pt to contact skin center as the path report in epic shows the area was calcinosis cutis and with its growing size to see if anything else needs to be done.

## 2022-01-20 ENCOUNTER — Other Ambulatory Visit: Payer: Self-pay | Admitting: Family Medicine

## 2022-01-20 MED ORDER — CLINDAMYCIN HCL 300 MG PO CAPS: 300.0000 mg | ORAL_CAPSULE | Freq: Three times a day (TID) | ORAL | 0 refills | Status: DC

## 2022-01-20 MED ORDER — MUPIROCIN 2 % EX OINT: TOPICAL_OINTMENT | CUTANEOUS | 3 refills | Status: DC

## 2022-01-20 MED ORDER — FLUCONAZOLE 150 MG PO TABS: ORAL_TABLET | ORAL | 0 refills | Status: DC

## 2022-01-22 ENCOUNTER — Telehealth: Payer: Self-pay | Admitting: Family Medicine

## 2022-01-22 DIAGNOSIS — Z1231 Encounter for screening mammogram for malignant neoplasm of breast: Secondary | ICD-10-CM

## 2022-01-22 NOTE — Telephone Encounter (Signed)
Please call pt and remind her due for mammogram and that we are happy to get her scheduled if she would like

## 2022-01-22 NOTE — Telephone Encounter (Signed)
Patient advised. Order placed.

## 2022-01-26 DIAGNOSIS — F332 Major depressive disorder, recurrent severe without psychotic features: Secondary | ICD-10-CM | POA: Diagnosis not present

## 2022-01-26 DIAGNOSIS — R69 Illness, unspecified: Secondary | ICD-10-CM | POA: Diagnosis not present

## 2022-02-02 DIAGNOSIS — F332 Major depressive disorder, recurrent severe without psychotic features: Secondary | ICD-10-CM | POA: Diagnosis not present

## 2022-02-02 DIAGNOSIS — R69 Illness, unspecified: Secondary | ICD-10-CM | POA: Diagnosis not present

## 2022-02-09 DIAGNOSIS — R69 Illness, unspecified: Secondary | ICD-10-CM | POA: Diagnosis not present

## 2022-02-09 DIAGNOSIS — F332 Major depressive disorder, recurrent severe without psychotic features: Secondary | ICD-10-CM | POA: Diagnosis not present

## 2022-02-16 ENCOUNTER — Other Ambulatory Visit: Payer: Self-pay | Admitting: Physician Assistant

## 2022-02-16 DIAGNOSIS — F332 Major depressive disorder, recurrent severe without psychotic features: Secondary | ICD-10-CM | POA: Diagnosis not present

## 2022-02-16 DIAGNOSIS — R69 Illness, unspecified: Secondary | ICD-10-CM | POA: Diagnosis not present

## 2022-03-02 DIAGNOSIS — F332 Major depressive disorder, recurrent severe without psychotic features: Secondary | ICD-10-CM | POA: Diagnosis not present

## 2022-03-02 DIAGNOSIS — R69 Illness, unspecified: Secondary | ICD-10-CM | POA: Diagnosis not present

## 2022-03-09 DIAGNOSIS — R69 Illness, unspecified: Secondary | ICD-10-CM | POA: Diagnosis not present

## 2022-03-09 DIAGNOSIS — F332 Major depressive disorder, recurrent severe without psychotic features: Secondary | ICD-10-CM | POA: Diagnosis not present

## 2022-03-12 ENCOUNTER — Other Ambulatory Visit: Payer: Self-pay | Admitting: Neurology

## 2022-03-16 ENCOUNTER — Telehealth: Payer: Self-pay | Admitting: Neurology

## 2022-03-16 MED ORDER — AIMOVIG 140 MG/ML ~~LOC~~ SOAJ: SUBCUTANEOUS | 0 refills | Status: DC

## 2022-03-16 NOTE — Telephone Encounter (Signed)
1. Which medications need refilled? (List name and dosage, if known) Aimovig - got a notification it was rejected previously  2. Which pharmacy/location is medication to be sent to? (include street and city if local pharmacy) [place]

## 2022-03-23 DIAGNOSIS — F332 Major depressive disorder, recurrent severe without psychotic features: Secondary | ICD-10-CM | POA: Diagnosis not present

## 2022-03-23 DIAGNOSIS — R69 Illness, unspecified: Secondary | ICD-10-CM | POA: Diagnosis not present

## 2022-03-25 DIAGNOSIS — R69 Illness, unspecified: Secondary | ICD-10-CM | POA: Diagnosis not present

## 2022-03-25 DIAGNOSIS — F909 Attention-deficit hyperactivity disorder, unspecified type: Secondary | ICD-10-CM | POA: Diagnosis not present

## 2022-03-30 DIAGNOSIS — R69 Illness, unspecified: Secondary | ICD-10-CM | POA: Diagnosis not present

## 2022-03-30 DIAGNOSIS — F332 Major depressive disorder, recurrent severe without psychotic features: Secondary | ICD-10-CM | POA: Diagnosis not present
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
## 2022-04-01 ENCOUNTER — Encounter: Payer: Self-pay | Admitting: Medical-Surgical

## 2022-04-01 ENCOUNTER — Ambulatory Visit (INDEPENDENT_AMBULATORY_CARE_PROVIDER_SITE_OTHER): Payer: 59 | Admitting: Medical-Surgical

## 2022-04-01 ENCOUNTER — Ambulatory Visit: Payer: 59 | Admitting: Neurology

## 2022-04-01 VITALS — BP 120/74 | HR 71 | Resp 20 | Ht 65.0 in | Wt 172.9 lb

## 2022-04-01 DIAGNOSIS — J329 Chronic sinusitis, unspecified: Secondary | ICD-10-CM | POA: Diagnosis not present

## 2022-04-01 DIAGNOSIS — J40 Bronchitis, not specified as acute or chronic: Secondary | ICD-10-CM

## 2022-04-01 DIAGNOSIS — R0981 Nasal congestion: Secondary | ICD-10-CM

## 2022-04-01 LAB — POCT INFLUENZA A/B
Influenza A, POC: NEGATIVE
Influenza B, POC: POSITIVE — AB

## 2022-04-01 LAB — POC COVID19 BINAXNOW: SARS Coronavirus 2 Ag: NEGATIVE

## 2022-04-01 MED ORDER — FLUCONAZOLE 150 MG PO TABS: 150.0000 mg | ORAL_TABLET | Freq: Once | ORAL | 1 refills | Status: AC

## 2022-04-01 MED ORDER — CEFDINIR 300 MG PO CAPS: 300.0000 mg | ORAL_CAPSULE | Freq: Two times a day (BID) | ORAL | 0 refills | Status: DC

## 2022-04-01 NOTE — Progress Notes (Signed)
Established Patient Office Visit  Subjective   Patient ID: Julie Johnston, female   DOB: 27-Apr-1970 Age: 52 y.o. MRN: [id number]   Chief Complaint  Patient presents with   Cough   Chills   Generalized Body Aches   HPI Pleasant 52 year old female presenting today for evaluation of 7 days of upper respiratory symptoms.  Notes that she has had a constant headache, body aches, chills, ear pressure, facial pain, dental pain, and sinus/chest congestion.  There are times when her equilibrium feels a bit off due to the pressure in her ears.  Has been coughing up small amounts of sputum but has not looked at it so is not sure of the color has had no fevers, nausea, vomiting, or diarrhea.  Endorses a poor appetite but she is drinking plenty of fluid.  Has been using ginger tea and bone broth at home with little relief of symptoms.  She tested negative for COVID at home 2 days ago.   Objective:    Vitals:   04/01/22 1011  BP: 120/74  Pulse: 71  Resp: 20  Height: '5\' 5"'$  (1.651 m)  Weight: 172 lb 14.4 oz (78.4 kg)  SpO2: 100%  BMI (Calculated): 28.77    Physical Exam Vitals and nursing note reviewed.  Constitutional:      General: She is not in acute distress.    Appearance: Normal appearance. She is not ill-appearing.  HENT:     Head: Normocephalic and atraumatic.     Nose:     Right Sinus: Maxillary sinus tenderness present. No frontal sinus tenderness.     Left Sinus: Maxillary sinus tenderness present. No frontal sinus tenderness.  Cardiovascular:     Rate and Rhythm: Normal rate and regular rhythm.     Pulses: Normal pulses.     Heart sounds: Normal heart sounds.  Pulmonary:     Effort: Pulmonary effort is normal. No respiratory distress.     Breath sounds: Normal breath sounds. No wheezing, rhonchi or rales.  Skin:    General: Skin is warm and dry.  Neurological:     Mental Status: She is alert and oriented to person, place, and time.  Psychiatric:        Mood and Affect:  Mood normal.        Behavior: Behavior normal.        Thought Content: Thought content normal.        Judgment: Judgment normal.   No results found for this or any previous visit (from the past 24 hour(s)).     The ASCVD Risk score (Arnett DK, et al., 2019) failed to calculate for the following reasons:   Cannot find a previous HDL lab   Assessment & Plan:   1. Sinobronchitis Treating with cefdinir 300 mg twice daily for 7 days.  Adding Diflucan for vulvovaginal candidiasis prophylaxis.  Offered prescription cough medication however she plans to just use NyQuil.  We did go ahead and do COVID and flu swabs today just so that she can warn family or friends if its positive.  She came back positive for influenza B.  Outside of the window for Tamiflu.  If no improvement in symptoms by Monday, advised her to reach out and we can add a burst of steroids to her treatment. - fluconazole (DIFLUCAN) 150 MG tablet; Take 1 tablet (150 mg total) by mouth once for 1 dose. Repeat dose 72 hours if yeast infection persists  Dispense: 2 tablet; Refill: 1 - cefdinir (OMNICEF) 300  MG capsule; Take 1 capsule (300 mg total) by mouth 2 (two) times daily.  Dispense: 14 capsule; Refill: 0  Return if symptoms worsen or fail to improve.  ___________________________________________ Clearnce Sorrel, DNP, APRN, FNP-BC [facility] [place]

## 2022-04-01 NOTE — Addendum Note (Signed)
Addended by: Beverlee Nims on: 04/01/2022 11:39 AM   Modules accepted: Orders

## 2022-04-06 ENCOUNTER — Ambulatory Visit: Payer: 59 | Admitting: Neurology

## 2022-04-06 ENCOUNTER — Encounter: Payer: Self-pay | Admitting: Neurology

## 2022-04-06 VITALS — BP 147/82 | HR 67 | Ht 65.0 in | Wt 171.6 lb

## 2022-04-06 DIAGNOSIS — F332 Major depressive disorder, recurrent severe without psychotic features: Secondary | ICD-10-CM

## 2022-04-06 DIAGNOSIS — F0781 Postconcussional syndrome: Secondary | ICD-10-CM | POA: Diagnosis not present

## 2022-04-06 DIAGNOSIS — G43009 Migraine without aura, not intractable, without status migrainosus: Secondary | ICD-10-CM

## 2022-04-06 DIAGNOSIS — R69 Illness, unspecified: Secondary | ICD-10-CM | POA: Diagnosis not present
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
## 2022-04-10 ENCOUNTER — Other Ambulatory Visit: Payer: Self-pay | Admitting: Neurology

## 2022-04-20 DIAGNOSIS — R69 Illness, unspecified: Secondary | ICD-10-CM | POA: Diagnosis not present

## 2022-04-20 DIAGNOSIS — F332 Major depressive disorder, recurrent severe without psychotic features: Secondary | ICD-10-CM | POA: Diagnosis not present

## 2022-04-21 ENCOUNTER — Ambulatory Visit: Payer: 59 | Admitting: Neurology

## 2022-04-27 DIAGNOSIS — F332 Major depressive disorder, recurrent severe without psychotic features: Secondary | ICD-10-CM | POA: Diagnosis not present

## 2022-04-27 DIAGNOSIS — R69 Illness, unspecified: Secondary | ICD-10-CM | POA: Diagnosis not present

## 2022-05-04 DIAGNOSIS — F332 Major depressive disorder, recurrent severe without psychotic features: Secondary | ICD-10-CM | POA: Diagnosis not present

## 2022-05-04 DIAGNOSIS — R69 Illness, unspecified: Secondary | ICD-10-CM | POA: Diagnosis not present

## 2022-05-09 ENCOUNTER — Other Ambulatory Visit: Payer: Self-pay | Admitting: Physician Assistant

## 2022-05-11 DIAGNOSIS — R69 Illness, unspecified: Secondary | ICD-10-CM | POA: Diagnosis not present

## 2022-05-11 DIAGNOSIS — F909 Attention-deficit hyperactivity disorder, unspecified type: Secondary | ICD-10-CM | POA: Diagnosis not present

## 2022-05-11 DIAGNOSIS — F339 Major depressive disorder, recurrent, unspecified: Secondary | ICD-10-CM | POA: Diagnosis not present

## 2022-05-11 DIAGNOSIS — F332 Major depressive disorder, recurrent severe without psychotic features: Secondary | ICD-10-CM | POA: Diagnosis not present

## 2022-05-29 ENCOUNTER — Other Ambulatory Visit: Payer: Self-pay | Admitting: Physician Assistant

## 2022-06-09 ENCOUNTER — Encounter: Payer: Self-pay | Admitting: Physician Assistant

## 2022-06-09 ENCOUNTER — Telehealth: Payer: Self-pay

## 2022-06-09 DIAGNOSIS — Z113 Encounter for screening for infections with a predominantly sexual mode of transmission: Secondary | ICD-10-CM | POA: Diagnosis not present

## 2022-06-09 DIAGNOSIS — Z1159 Encounter for screening for other viral diseases: Secondary | ICD-10-CM | POA: Diagnosis not present

## 2022-06-09 DIAGNOSIS — R69 Illness, unspecified: Secondary | ICD-10-CM | POA: Diagnosis not present

## 2022-06-09 DIAGNOSIS — F5231 Female orgasmic disorder: Secondary | ICD-10-CM | POA: Diagnosis not present

## 2022-06-09 DIAGNOSIS — Z118 Encounter for screening for other infectious and parasitic diseases: Secondary | ICD-10-CM | POA: Diagnosis not present

## 2022-06-09 DIAGNOSIS — Z01419 Encounter for gynecological examination (general) (routine) without abnormal findings: Secondary | ICD-10-CM | POA: Diagnosis not present

## 2022-06-09 NOTE — Telephone Encounter (Signed)
Fax received from [facility] approval for Aimovig 140 mg.  05/21/22-03/22/23

## 2022-06-10 ENCOUNTER — Other Ambulatory Visit: Payer: Self-pay | Admitting: Neurology

## 2022-06-10 MED ORDER — CELECOXIB 200 MG PO CAPS: ORAL_CAPSULE | ORAL | 0 refills | Status: DC

## 2022-06-10 NOTE — Telephone Encounter (Signed)
Patient scheduled an appointment . One week  of medication given until patient can be seen for her appointment.

## 2022-06-11 ENCOUNTER — Other Ambulatory Visit: Payer: Self-pay | Admitting: Physician Assistant

## 2022-06-11 MED ORDER — CELECOXIB 200 MG PO CAPS: ORAL_CAPSULE | ORAL | 0 refills | Status: DC

## 2022-06-15 ENCOUNTER — Ambulatory Visit (INDEPENDENT_AMBULATORY_CARE_PROVIDER_SITE_OTHER): Payer: Medicare HMO | Admitting: Physician Assistant

## 2022-06-15 VITALS — BP 142/74 | HR 64 | Ht 65.0 in | Wt 172.0 lb

## 2022-06-15 DIAGNOSIS — R69 Illness, unspecified: Secondary | ICD-10-CM | POA: Diagnosis not present

## 2022-06-15 DIAGNOSIS — Z79899 Other long term (current) drug therapy: Secondary | ICD-10-CM

## 2022-06-15 DIAGNOSIS — Z9884 Bariatric surgery status: Secondary | ICD-10-CM

## 2022-06-15 DIAGNOSIS — L304 Erythema intertrigo: Secondary | ICD-10-CM | POA: Diagnosis not present

## 2022-06-15 DIAGNOSIS — M797 Fibromyalgia: Secondary | ICD-10-CM

## 2022-06-15 DIAGNOSIS — F411 Generalized anxiety disorder: Secondary | ICD-10-CM

## 2022-06-15 DIAGNOSIS — F332 Major depressive disorder, recurrent severe without psychotic features: Secondary | ICD-10-CM

## 2022-06-15 DIAGNOSIS — L987 Excessive and redundant skin and subcutaneous tissue: Secondary | ICD-10-CM

## 2022-06-15 MED ORDER — CELECOXIB 200 MG PO CAPS: ORAL_CAPSULE | ORAL | 3 refills | Status: DC

## 2022-06-15 MED ORDER — OMEPRAZOLE 20 MG PO CPDR: 20.0000 mg | DELAYED_RELEASE_CAPSULE | Freq: Every day | ORAL | 3 refills | Status: DC

## 2022-06-15 MED ORDER — NYSTATIN 100000 UNIT/GM EX CREA: 1.0000 | TOPICAL_CREAM | Freq: Two times a day (BID) | CUTANEOUS | 2 refills | Status: AC

## 2022-06-15 NOTE — Progress Notes (Signed)
Established Patient Office Visit  Subjective   Patient ID: Julie Johnston, female    DOB: Jul 23, 1970  Age: 52 y.o. MRN: [id number]  Chief Complaint  Patient presents with   Follow-up    HPI Pt is a 52 yo female with history of rou-en-Y gastric bypass in 08/2020 who presents to the clinic for follow up. She has lost over 100lbs.   She is doing well with fibromyalgia and pain on celebrex. No concerns.   She is having a lot of stress right now. Her mother passed away and her dog is dying. Insurance is not wanting to pay for her depression medication. She continues to go to counseling. No SI/HC.   She does have intermittent rash under excess abdominal skin. She would like to consider surgery for removal at some point to avoid the skin irritation. It is painful when the skin opens up.   .. Active Ambulatory Problems    Diagnosis Date Noted   Lyme disease 05/09/2015   Insomnia 05/09/2015   Palpitations 05/09/2015   Chronic fatigue 05/09/2015   No energy 05/09/2015   Hyperlipidemia 05/09/2015   Pap smear abnormality of cervix with LGSIL 02/07/2014   Attention deficit hyperactivity disorder (ADHD), predominantly inattentive type 05/09/2015   Major depressive disorder    Obstructive sleep apnea 06/18/2015   Fibromyalgia 09/22/2015   Drug-induced weight gain 02/23/2016   Migraine 11/29/2017   Frequent infections 05/08/2018   Decreased thyroid stimulating hormone (TSH) level 05/08/2018   Equivocal stress echocardiogram 05/17/2018   Essential hypertension 06/04/2019   Post concussion syndrome 10/01/2019   Acute thoracic back pain 10/01/2019   PTSD (post-traumatic stress disorder) 10/01/2019   Generalized anxiety disorder 11/07/2019   Mild traumatic brain injury 09/12/2019   Pulmonary collapse 06/10/2014   Polyp of transverse colon 02/25/2020   Diverticulosis of colon 02/25/2020   Internal hemorrhoids 02/25/2020   Dizziness 05/19/2020   Double vision 05/19/2020   Vertigo  05/19/2020   Severe obesity (BMI >= 40) ([place]) 05/27/2020   Other abnormalities of gait and mobility 06/02/2020   Acute intractable headache 08/01/2020   Post-concussion headache 08/04/2020   Parotiditis 08/14/2020   History of Roux-en-Y gastric bypass 11/18/2020   Paresthesia [id number]   Non-alcoholic fatty liver disease 11/25/2020   Intractable migraine with aura with status migrainosus 12/16/2020   Elevated liver enzymes 12/17/2020   Screen for STD (sexually transmitted disease) 08/19/2021   Cellulitis of right upper extremity 01/19/2022   Cognitive change 10/26/2019   Concussion without loss of consciousness 10/26/2019   Post-operative nausea and vomiting 08/29/2020   TMJ pain dysfunction syndrome 01/09/2021   Adjustment disorder with mixed anxiety and depressed mood 10/26/2019   Intertrigo 06/15/2022   Excess skin of abdominal wall 06/15/2022   Resolved Ambulatory Problems    Diagnosis Date Noted   Pre-diabetes 05/14/2015   Urine ketones 09/22/2015   Obesity 09/22/2015   Dysuria 11/29/2017   Influenza B 12/16/2017   Potential exposure to STD 12/16/2017   Influenza-like illness 07/20/2018   Right posterior sixth rib fracture 10/02/2019   Controlled type 2 diabetes mellitus with complication, without long-term current use of insulin ([place]) 05/27/2020   Unsteadiness on feet 06/02/2020   Head injury 08/01/2020   Parotid swelling 08/13/2020   Ear fullness, right 08/14/2020   Foreign body (FB) in soft tissue 09/19/2020   Current hepatitis A infection 11/25/2020   Adhesive capsulitis of left shoulder 04/18/2017   Closed torus fracture of lower end of left fibula 09/20/2018   Foot  sprain, right, initial encounter 08/21/2018   Past Medical History:  Diagnosis Date   Obesity (BMI 35.0-39.9 without comorbidity)     ROS   See HPI>  Objective:     BP (!) 142/74   Pulse 64   Ht 5\' 5"  (1.651 m)   Wt 172 lb (78 kg)   SpO2 100%   BMI 28.62 kg/m  BP Readings from Last 3  Encounters:  06/15/22 (!) 142/74  04/06/22 (!) 147/82  04/01/22 120/74   Wt Readings from Last 3 Encounters:  06/15/22 172 lb (78 kg)  04/06/22 171 lb 9.6 oz (77.8 kg)  04/01/22 172 lb 14.4 oz (78.4 kg)   ..    06/15/2022    1:17 PM 04/01/2022   11:38 AM 06/05/2021    2:26 PM 07/21/2020    3:05 PM 11/06/2019    3:14 PM  Depression screen PHQ 2/9  Decreased Interest 2 2 2 3 3   Down, Depressed, Hopeless 3 3 2 1 3   PHQ - 2 Score 5 5 4 4 6   Altered sleeping 3 3 3 3 3   Tired, decreased energy 3 3 3 3 3   Change in appetite 1 1 1 1 3   Feeling bad or failure about yourself  3 2 3 2 3   Trouble concentrating 3 3 3 3 3   Moving slowly or fidgety/restless 0 0 0 0 0  Suicidal thoughts 0 0 0 0 0  PHQ-9 Score 18 17 17 16 21   Difficult doing work/chores Extremely dIfficult Somewhat difficult Extremely dIfficult Somewhat difficult Extremely dIfficult   ..    06/15/2022    1:17 PM 04/01/2022   11:39 AM 06/05/2021    2:27 PM 11/06/2019    3:13 PM  GAD 7 : Generalized Anxiety Score  Nervous, Anxious, on Edge 3 3 3 3   Control/stop worrying 3 3 3 3   Worry too much - different things 3 3 3 3   Trouble relaxing 3 3 2 3   Restless 2 2 1  0  Easily annoyed or irritable 3 3 2 3   Afraid - awful might happen 3 3 3 3   Total GAD 7 Score 20 20 17 18   Anxiety Difficulty Extremely difficult Very difficult Extremely difficult Extremely difficult       Physical Exam Constitutional:      Appearance: Normal appearance.  HENT:     Head: Normocephalic.  Cardiovascular:     Rate and Rhythm: Normal rate and regular rhythm.  Pulmonary:     Effort: Pulmonary effort is normal.  Skin:    Comments: Excess abdominal skin that falls below the pubic symphysis and erythematous linear rash of left abdomen skin under panniculus with some superficial skin tearing.   Neurological:     General: No focal deficit present.     Mental Status: She is alert and oriented to person, place, and time.  Psychiatric:        Mood  and Affect: Mood normal.         Assessment & Plan:  [name] KitchenMarland KitchenPaola was seen today for follow-up.  Diagnoses and all orders for this visit:  Fibromyalgia -     celecoxib (CELEBREX) 200 MG capsule; TAKE 1 TO 2 CAPSULES BY MOUTH DAILY AS NEEDED FOR PAIN.  History of Roux-en-Y gastric bypass -     omeprazole (PRILOSEC) 20 MG capsule; Take 1 capsule (20 mg total) by mouth daily.  Medication management -     COMPLETE METABOLIC PANEL WITH GFR -     omeprazole (  PRILOSEC) 20 MG capsule; Take 1 capsule (20 mg total) by mouth daily.  Intertrigo -     nystatin cream (MYCOSTATIN); Apply 1 Application topically 2 (two) times daily.  Excess skin of abdominal wall   Refilled celebrex  Cmp ordered Continue on omeprazole Discussed excess skin and keeping area dry, pt has lost over 100lbs Use nystatin as needed Consider skin removal in the future  PHQ and GAD not to goal Pt sees [place], follow up as needed for refills and medication dose changes Pt is going to regular counseling Pt denies any SI/HC    Iran Planas, PA-C

## 2022-06-19 ENCOUNTER — Other Ambulatory Visit: Payer: Self-pay | Admitting: Neurology

## 2022-08-04 DIAGNOSIS — F909 Attention-deficit hyperactivity disorder, unspecified type: Secondary | ICD-10-CM | POA: Diagnosis not present

## 2022-08-04 DIAGNOSIS — F339 Major depressive disorder, recurrent, unspecified: Secondary | ICD-10-CM | POA: Diagnosis not present

## 2022-08-20 ENCOUNTER — Ambulatory Visit: Payer: Medicare HMO | Admitting: Medical-Surgical

## 2022-09-01 ENCOUNTER — Other Ambulatory Visit: Payer: Self-pay | Admitting: Neurology

## 2022-09-01 DIAGNOSIS — H532 Diplopia: Secondary | ICD-10-CM | POA: Diagnosis not present

## 2022-09-01 DIAGNOSIS — H5000 Unspecified esotropia: Secondary | ICD-10-CM | POA: Diagnosis not present

## 2022-09-01 DIAGNOSIS — H50012 Monocular esotropia, left eye: Secondary | ICD-10-CM | POA: Diagnosis not present

## 2022-09-07 ENCOUNTER — Ambulatory Visit (INDEPENDENT_AMBULATORY_CARE_PROVIDER_SITE_OTHER): Payer: Medicare HMO

## 2022-09-07 ENCOUNTER — Ambulatory Visit
Admission: EM | Admit: 2022-09-07 | Discharge: 2022-09-07 | Disposition: A | Discharge status: Home / Self Care | Payer: Medicare HMO | Attending: Urgent Care | Admitting: Urgent Care

## 2022-09-07 DIAGNOSIS — G5703 Lesion of sciatic nerve, bilateral lower limbs: Secondary | ICD-10-CM

## 2022-09-07 DIAGNOSIS — S6991XA Unspecified injury of right wrist, hand and finger(s), initial encounter: Secondary | ICD-10-CM | POA: Diagnosis not present

## 2022-09-07 DIAGNOSIS — S14109A Unspecified injury at unspecified level of cervical spinal cord, initial encounter: Secondary | ICD-10-CM

## 2022-09-07 DIAGNOSIS — S63501A Unspecified sprain of right wrist, initial encounter: Secondary | ICD-10-CM | POA: Diagnosis not present

## 2022-09-07 DIAGNOSIS — S0083XA Contusion of other part of head, initial encounter: Secondary | ICD-10-CM | POA: Diagnosis not present

## 2022-09-07 DIAGNOSIS — W19XXXA Unspecified fall, initial encounter: Secondary | ICD-10-CM

## 2022-09-07 DIAGNOSIS — M47816 Spondylosis without myelopathy or radiculopathy, lumbar region: Secondary | ICD-10-CM | POA: Diagnosis not present

## 2022-09-07 DIAGNOSIS — M533 Sacrococcygeal disorders, not elsewhere classified: Secondary | ICD-10-CM

## 2022-09-07 DIAGNOSIS — Z043 Encounter for examination and observation following other accident: Secondary | ICD-10-CM | POA: Diagnosis not present

## 2022-09-07 DIAGNOSIS — S0511XA Contusion of eyeball and orbital tissues, right eye, initial encounter: Secondary | ICD-10-CM

## 2022-09-07 DIAGNOSIS — M25531 Pain in right wrist: Secondary | ICD-10-CM | POA: Diagnosis not present

## 2022-09-07 MED ORDER — METAXALONE 800 MG PO TABS: 800.0000 mg | ORAL_TABLET | Freq: Three times a day (TID) | ORAL | 0 refills | Status: DC

## 2022-09-07 MED ORDER — PREDNISONE 10 MG (21) PO TBPK: ORAL_TABLET | Freq: Every day | ORAL | 0 refills | Status: DC
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
## 2022-09-07 NOTE — Discharge Instructions (Addendum)
I will call you with the results of your xrays if there are any acute, concerning findings. Please continue taking your gabapentin and Celebrex as ordered. Start the steroid taper pack, best to take this medication in the morning times.  This should significantly help with your muscular inflammation and piriformis pain. Please take the Skelaxin, a muscle relaxer, every 8 hours or 3 times daily as needed.  Use with caution as it may make you feel tired. Consider taking warm Epsom salt baths to help relax your muscles. Consider purchasing an over-the-counter lidocaine patch, made by [employer].  You may place this on your lower back and wear for up to 12 hours.  Should your symptoms persist or worsen, please follow-up with your primary care physician.

## 2022-09-08 ENCOUNTER — Encounter: Payer: Self-pay | Admitting: Physician Assistant

## 2022-09-08 ENCOUNTER — Ambulatory Visit (INDEPENDENT_AMBULATORY_CARE_PROVIDER_SITE_OTHER): Payer: Medicare HMO | Admitting: Physician Assistant

## 2022-09-08 VITALS — BP 150/84 | HR 88 | Ht 65.0 in | Wt 171.0 lb

## 2022-09-08 DIAGNOSIS — J0140 Acute pansinusitis, unspecified: Secondary | ICD-10-CM

## 2022-09-08 DIAGNOSIS — M545 Low back pain, unspecified: Secondary | ICD-10-CM | POA: Diagnosis not present

## 2022-09-08 DIAGNOSIS — M5136 Other intervertebral disc degeneration, lumbar region: Secondary | ICD-10-CM | POA: Diagnosis not present

## 2022-09-08 DIAGNOSIS — M797 Fibromyalgia: Secondary | ICD-10-CM

## 2022-09-08 DIAGNOSIS — G44309 Post-traumatic headache, unspecified, not intractable: Secondary | ICD-10-CM | POA: Diagnosis not present

## 2022-09-08 DIAGNOSIS — G8929 Other chronic pain: Secondary | ICD-10-CM

## 2022-09-08 DIAGNOSIS — F332 Major depressive disorder, recurrent severe without psychotic features: Secondary | ICD-10-CM | POA: Diagnosis not present

## 2022-09-08 DIAGNOSIS — R52 Pain, unspecified: Secondary | ICD-10-CM

## 2022-09-08 MED ORDER — FLUCONAZOLE 150 MG PO TABS: 150.0000 mg | ORAL_TABLET | Freq: Once | ORAL | 1 refills | Status: AC

## 2022-09-08 MED ORDER — KETOROLAC TROMETHAMINE 60 MG/2ML IM SOLN
60.0000 mg | Freq: Once | INTRAMUSCULAR | Status: AC
Start: 2022-09-08 — End: 2022-09-08
  Administered 2022-09-08: 60 mg via INTRAMUSCULAR

## 2022-09-08 MED ORDER — AMOXICILLIN-POT CLAVULANATE 875-125 MG PO TABS
1.0000 | ORAL_TABLET | Freq: Two times a day (BID) | ORAL | 0 refills | Status: DC
Start: 2022-09-08 — End: 2022-10-06

## 2022-09-08 MED ORDER — TRAMADOL HCL 50 MG PO TABS
50.0000 mg | ORAL_TABLET | Freq: Three times a day (TID) | ORAL | 0 refills | Status: AC | PRN
Start: 2022-09-08 — End: 2022-09-13
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
## 2022-09-09 ENCOUNTER — Encounter: Payer: Self-pay | Admitting: Physician Assistant

## 2022-09-09 DIAGNOSIS — M542 Cervicalgia: Secondary | ICD-10-CM

## 2022-09-10 ENCOUNTER — Encounter: Payer: Self-pay | Admitting: Physician Assistant

## 2022-09-17 MED ORDER — METAXALONE 800 MG PO TABS: 800.0000 mg | ORAL_TABLET | Freq: Three times a day (TID) | ORAL | 0 refills | Status: DC

## 2022-09-17 NOTE — Addendum Note (Signed)
Addended byJomarie Longs on: 09/17/2022 12:50 PM   Modules accepted: Orders

## 2022-09-19 ENCOUNTER — Ambulatory Visit: Payer: Medicare HMO

## 2022-09-19 ENCOUNTER — Ambulatory Visit (INDEPENDENT_AMBULATORY_CARE_PROVIDER_SITE_OTHER): Payer: Medicare HMO

## 2022-09-19 DIAGNOSIS — M5137 Other intervertebral disc degeneration, lumbosacral region: Secondary | ICD-10-CM | POA: Diagnosis not present

## 2022-09-19 DIAGNOSIS — M5135 Other intervertebral disc degeneration, thoracolumbar region: Secondary | ICD-10-CM | POA: Diagnosis not present

## 2022-09-19 DIAGNOSIS — M5021 Other cervical disc displacement,  high cervical region: Secondary | ICD-10-CM | POA: Diagnosis not present

## 2022-09-19 DIAGNOSIS — M47816 Spondylosis without myelopathy or radiculopathy, lumbar region: Secondary | ICD-10-CM | POA: Diagnosis not present

## 2022-09-19 DIAGNOSIS — M50222 Other cervical disc displacement at C5-C6 level: Secondary | ICD-10-CM | POA: Diagnosis not present

## 2022-09-19 DIAGNOSIS — M5136 Other intervertebral disc degeneration, lumbar region: Secondary | ICD-10-CM | POA: Diagnosis not present

## 2022-09-19 DIAGNOSIS — M542 Cervicalgia: Secondary | ICD-10-CM | POA: Diagnosis not present

## 2022-09-19 DIAGNOSIS — G8929 Other chronic pain: Secondary | ICD-10-CM

## 2022-09-19 DIAGNOSIS — M545 Low back pain, unspecified: Secondary | ICD-10-CM | POA: Diagnosis not present

## 2022-09-19 DIAGNOSIS — M47812 Spondylosis without myelopathy or radiculopathy, cervical region: Secondary | ICD-10-CM | POA: Diagnosis not present

## 2022-09-19 DIAGNOSIS — M50221 Other cervical disc displacement at C4-C5 level: Secondary | ICD-10-CM | POA: Diagnosis not present

## 2022-09-21 ENCOUNTER — Other Ambulatory Visit: Payer: Self-pay | Admitting: Physician Assistant

## 2022-09-21 DIAGNOSIS — M25531 Pain in right wrist: Secondary | ICD-10-CM | POA: Diagnosis not present

## 2022-09-27 ENCOUNTER — Encounter: Payer: Self-pay | Admitting: Physician Assistant

## 2022-09-27 DIAGNOSIS — M51369 Other intervertebral disc degeneration, lumbar region without mention of lumbar back pain or lower extremity pain: Secondary | ICD-10-CM

## 2022-09-27 DIAGNOSIS — M5136 Other intervertebral disc degeneration, lumbar region: Secondary | ICD-10-CM

## 2022-09-27 DIAGNOSIS — M542 Cervicalgia: Secondary | ICD-10-CM

## 2022-09-27 DIAGNOSIS — M5030 Other cervical disc degeneration, unspecified cervical region: Secondary | ICD-10-CM

## 2022-09-27 DIAGNOSIS — M519 Unspecified thoracic, thoracolumbar and lumbosacral intervertebral disc disorder: Secondary | ICD-10-CM

## 2022-09-27 DIAGNOSIS — M545 Low back pain, unspecified: Secondary | ICD-10-CM

## 2022-09-27 DIAGNOSIS — G8929 Other chronic pain: Secondary | ICD-10-CM

## 2022-09-27 NOTE — Telephone Encounter (Signed)
Ortho referral placed.  I need a diagnosis for the rheumatoid when I do not see anything on her problem list that would be a appropriate referral.  Orders Placed This Encounter  Procedures   Ambulatory referral to Orthopedic Surgery    Referral Priority:   Routine    Referral Type:   Surgical    Referral Reason:   Specialty Services Required    Requested Specialty:   Orthopedic Surgery    Number of Visits Requested:   1

## 2022-09-28 ENCOUNTER — Encounter: Payer: Self-pay | Admitting: Physician Assistant

## 2022-09-28 DIAGNOSIS — M5030 Other cervical disc degeneration, unspecified cervical region: Secondary | ICD-10-CM | POA: Insufficient documentation

## 2022-09-28 NOTE — Progress Notes (Signed)
Cervical spine shows lots of disc bulging and degenerative changes with some moderate to severe foraminal narrowing.

## 2022-09-28 NOTE — Progress Notes (Signed)
L3 and L4 annular fissure with stenosis and facet arthritis. No acute changes.

## 2022-09-29 ENCOUNTER — Other Ambulatory Visit: Payer: Self-pay | Admitting: Physician Assistant

## 2022-09-29 DIAGNOSIS — S32030S Wedge compression fracture of third lumbar vertebra, sequela: Secondary | ICD-10-CM

## 2022-09-29 DIAGNOSIS — S32010S Wedge compression fracture of first lumbar vertebra, sequela: Secondary | ICD-10-CM

## 2022-09-29 DIAGNOSIS — Z13820 Encounter for screening for osteoporosis: Secondary | ICD-10-CM

## 2022-10-01 DIAGNOSIS — M25631 Stiffness of right wrist, not elsewhere classified: Secondary | ICD-10-CM | POA: Diagnosis not present

## 2022-10-01 DIAGNOSIS — M25531 Pain in right wrist: Secondary | ICD-10-CM | POA: Diagnosis not present

## 2022-10-04 ENCOUNTER — Other Ambulatory Visit: Payer: Self-pay | Admitting: Neurology
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
## 2022-10-06 ENCOUNTER — Telehealth: Payer: Medicare HMO | Admitting: Neurology

## 2022-10-06 ENCOUNTER — Encounter: Payer: Self-pay | Admitting: Neurology

## 2022-10-06 DIAGNOSIS — F32A Depression, unspecified: Secondary | ICD-10-CM

## 2022-10-06 DIAGNOSIS — F411 Generalized anxiety disorder: Secondary | ICD-10-CM | POA: Diagnosis not present

## 2022-10-06 DIAGNOSIS — G43709 Chronic migraine without aura, not intractable, without status migrainosus: Secondary | ICD-10-CM

## 2022-10-06 DIAGNOSIS — F339 Major depressive disorder, recurrent, unspecified: Secondary | ICD-10-CM | POA: Diagnosis not present

## 2022-10-06 DIAGNOSIS — F909 Attention-deficit hyperactivity disorder, unspecified type: Secondary | ICD-10-CM | POA: Diagnosis not present

## 2022-10-06 MED ORDER — AIMOVIG 140 MG/ML ~~LOC~~ SOAJ: SUBCUTANEOUS | 11 refills | Status: DC

## 2022-10-06 MED ORDER — TIZANIDINE HCL 2 MG PO TABS: ORAL_TABLET | ORAL | 0 refills | Status: DC

## 2022-10-06 NOTE — Patient Instructions (Addendum)
STOP METAXALONE.  Start tizanidine 2mg  tablet: Take 1 tablet at bedtime for one week Then 1 tablet twice daily for one week Then 1 tablet three times daily If no improvement by refill, contact me and I can increase dose. Aimovig every 4 weeks Rizatriptan as needed

## 2022-10-12 ENCOUNTER — Telehealth: Payer: Self-pay | Admitting: Physician Assistant

## 2022-10-12 NOTE — Telephone Encounter (Signed)
Fax received about the rheumatology referral that was placed. Referral was denied, no appointment scheduled as there was nothing to offer.  Attempted to call pt but unable to reach. Left message for her to return call.

## 2022-10-13 NOTE — Telephone Encounter (Signed)
Received incoming call from patient regarding Rheumatology Referral. Pt advised that message was regarding incoming referral denial received from [facility].  Pt is very upset about referral denial. Pt was advised that we could potentially try other Rheumatology offices. Pt was given the name of [facility] ( Dr. Lanell Matar). Pt will contact office back to let me know how she would like to proceed.

## 2022-10-20 ENCOUNTER — Other Ambulatory Visit: Payer: Self-pay | Admitting: Family Medicine

## 2022-10-21 DIAGNOSIS — M545 Low back pain, unspecified: Secondary | ICD-10-CM | POA: Diagnosis not present

## 2022-10-21 DIAGNOSIS — M8589 Other specified disorders of bone density and structure, multiple sites: Secondary | ICD-10-CM | POA: Diagnosis not present

## 2022-10-21 DIAGNOSIS — S32000A Wedge compression fracture of unspecified lumbar vertebra, initial encounter for closed fracture: Secondary | ICD-10-CM | POA: Diagnosis not present

## 2022-10-21 DIAGNOSIS — M5416 Radiculopathy, lumbar region: Secondary | ICD-10-CM | POA: Diagnosis not present

## 2022-10-21 DIAGNOSIS — M5412 Radiculopathy, cervical region: Secondary | ICD-10-CM | POA: Diagnosis not present

## 2022-10-21 DIAGNOSIS — M85851 Other specified disorders of bone density and structure, right thigh: Secondary | ICD-10-CM | POA: Diagnosis not present

## 2022-10-21 DIAGNOSIS — M542 Cervicalgia: Secondary | ICD-10-CM | POA: Diagnosis not present

## 2022-10-21 LAB — HM DEXA SCAN

## 2022-10-29 ENCOUNTER — Other Ambulatory Visit: Payer: Self-pay | Admitting: Neurology

## 2022-10-29 DIAGNOSIS — M25531 Pain in right wrist: Secondary | ICD-10-CM | POA: Diagnosis not present

## 2022-10-29 DIAGNOSIS — M25631 Stiffness of right wrist, not elsewhere classified: Secondary | ICD-10-CM | POA: Diagnosis not present

## 2022-11-08 ENCOUNTER — Encounter: Payer: Medicare HMO | Admitting: Physician Assistant

## 2022-11-08 DIAGNOSIS — M25531 Pain in right wrist: Secondary | ICD-10-CM | POA: Diagnosis not present

## 2022-11-10 DIAGNOSIS — M545 Low back pain, unspecified: Secondary | ICD-10-CM | POA: Diagnosis not present

## 2022-11-10 DIAGNOSIS — M5412 Radiculopathy, cervical region: Secondary | ICD-10-CM | POA: Diagnosis not present

## 2022-11-10 DIAGNOSIS — M5416 Radiculopathy, lumbar region: Secondary | ICD-10-CM | POA: Diagnosis not present

## 2022-11-10 DIAGNOSIS — M542 Cervicalgia: Secondary | ICD-10-CM | POA: Diagnosis not present

## 2022-11-11 DIAGNOSIS — M5451 Vertebrogenic low back pain: Secondary | ICD-10-CM | POA: Diagnosis not present

## 2022-11-11 DIAGNOSIS — M25531 Pain in right wrist: Secondary | ICD-10-CM | POA: Diagnosis not present

## 2022-11-11 DIAGNOSIS — M542 Cervicalgia: Secondary | ICD-10-CM | POA: Diagnosis not present

## 2022-11-12 DIAGNOSIS — G894 Chronic pain syndrome: Secondary | ICD-10-CM | POA: Diagnosis not present

## 2022-11-12 DIAGNOSIS — M533 Sacrococcygeal disorders, not elsewhere classified: Secondary | ICD-10-CM | POA: Diagnosis not present

## 2022-11-12 DIAGNOSIS — M47816 Spondylosis without myelopathy or radiculopathy, lumbar region: Secondary | ICD-10-CM | POA: Diagnosis not present

## 2022-11-12 DIAGNOSIS — M7918 Myalgia, other site: Secondary | ICD-10-CM | POA: Diagnosis not present

## 2022-11-12 DIAGNOSIS — M5412 Radiculopathy, cervical region: Secondary | ICD-10-CM | POA: Diagnosis not present

## 2022-11-16 DIAGNOSIS — M542 Cervicalgia: Secondary | ICD-10-CM | POA: Diagnosis not present

## 2022-11-16 DIAGNOSIS — M5451 Vertebrogenic low back pain: Secondary | ICD-10-CM | POA: Diagnosis not present

## 2022-11-16 DIAGNOSIS — M25531 Pain in right wrist: Secondary | ICD-10-CM | POA: Diagnosis not present

## 2022-11-18 DIAGNOSIS — M5451 Vertebrogenic low back pain: Secondary | ICD-10-CM | POA: Diagnosis not present

## 2022-11-18 DIAGNOSIS — M542 Cervicalgia: Secondary | ICD-10-CM | POA: Diagnosis not present

## 2022-11-18 DIAGNOSIS — M25531 Pain in right wrist: Secondary | ICD-10-CM | POA: Diagnosis not present

## 2022-11-19 ENCOUNTER — Ambulatory Visit (INDEPENDENT_AMBULATORY_CARE_PROVIDER_SITE_OTHER): Payer: Medicare HMO | Admitting: Physician Assistant

## 2022-11-19 ENCOUNTER — Encounter: Payer: Self-pay | Admitting: Physician Assistant

## 2022-11-19 VITALS — BP 124/74 | HR 72 | Ht 65.0 in | Wt 179.0 lb

## 2022-11-19 DIAGNOSIS — I10 Essential (primary) hypertension: Secondary | ICD-10-CM | POA: Diagnosis not present

## 2022-11-19 DIAGNOSIS — Z0000 Encounter for general adult medical examination without abnormal findings: Secondary | ICD-10-CM

## 2022-11-19 DIAGNOSIS — Z9884 Bariatric surgery status: Secondary | ICD-10-CM

## 2022-11-19 DIAGNOSIS — Z7251 High risk heterosexual behavior: Secondary | ICD-10-CM | POA: Diagnosis not present

## 2022-11-19 DIAGNOSIS — L720 Epidermal cyst: Secondary | ICD-10-CM

## 2022-11-19 DIAGNOSIS — Z131 Encounter for screening for diabetes mellitus: Secondary | ICD-10-CM

## 2022-11-19 DIAGNOSIS — Z122 Encounter for screening for malignant neoplasm of respiratory organs: Secondary | ICD-10-CM

## 2022-11-19 DIAGNOSIS — Z87891 Personal history of nicotine dependence: Secondary | ICD-10-CM

## 2022-11-19 DIAGNOSIS — Z209 Contact with and (suspected) exposure to unspecified communicable disease: Secondary | ICD-10-CM

## 2022-11-19 DIAGNOSIS — R7989 Other specified abnormal findings of blood chemistry: Secondary | ICD-10-CM

## 2022-11-19 DIAGNOSIS — Z23 Encounter for immunization: Secondary | ICD-10-CM

## 2022-11-19 DIAGNOSIS — M818 Other osteoporosis without current pathological fracture: Secondary | ICD-10-CM | POA: Diagnosis not present

## 2022-11-19 DIAGNOSIS — F332 Major depressive disorder, recurrent severe without psychotic features: Secondary | ICD-10-CM | POA: Diagnosis not present

## 2022-11-19 NOTE — Patient Instructions (Signed)
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
## 2022-11-23 DIAGNOSIS — M542 Cervicalgia: Secondary | ICD-10-CM | POA: Diagnosis not present

## 2022-11-23 DIAGNOSIS — M5451 Vertebrogenic low back pain: Secondary | ICD-10-CM | POA: Diagnosis not present

## 2022-11-23 DIAGNOSIS — M25531 Pain in right wrist: Secondary | ICD-10-CM | POA: Diagnosis not present

## 2022-11-23 LAB — GC/CHLAMYDIA PROBE AMP
Chlamydia trachomatis, NAA: NEGATIVE
Neisseria Gonorrhoeae by PCR: NEGATIVE

## 2022-11-23 LAB — SPECIMEN STATUS REPORT

## 2022-11-23 NOTE — Progress Notes (Signed)
Negative GC/Chlamydia

## 2022-11-25 DIAGNOSIS — M25531 Pain in right wrist: Secondary | ICD-10-CM | POA: Diagnosis not present

## 2022-11-25 DIAGNOSIS — M5451 Vertebrogenic low back pain: Secondary | ICD-10-CM | POA: Diagnosis not present

## 2022-11-25 DIAGNOSIS — M542 Cervicalgia: Secondary | ICD-10-CM | POA: Diagnosis not present

## 2022-11-29 DIAGNOSIS — M5451 Vertebrogenic low back pain: Secondary | ICD-10-CM | POA: Diagnosis not present

## 2022-11-29 DIAGNOSIS — M542 Cervicalgia: Secondary | ICD-10-CM | POA: Diagnosis not present

## 2022-11-29 DIAGNOSIS — M25531 Pain in right wrist: Secondary | ICD-10-CM | POA: Diagnosis not present

## 2022-11-30 DIAGNOSIS — M5412 Radiculopathy, cervical region: Secondary | ICD-10-CM | POA: Diagnosis not present

## 2022-12-02 ENCOUNTER — Other Ambulatory Visit: Payer: Self-pay | Admitting: Physician Assistant

## 2022-12-02 ENCOUNTER — Telehealth: Payer: Self-pay | Admitting: Neurology

## 2022-12-02 DIAGNOSIS — M818 Other osteoporosis without current pathological fracture: Secondary | ICD-10-CM | POA: Diagnosis not present

## 2022-12-02 DIAGNOSIS — M542 Cervicalgia: Secondary | ICD-10-CM | POA: Diagnosis not present

## 2022-12-02 DIAGNOSIS — Z8781 Personal history of (healed) traumatic fracture: Secondary | ICD-10-CM | POA: Diagnosis not present

## 2022-12-02 DIAGNOSIS — M5451 Vertebrogenic low back pain: Secondary | ICD-10-CM | POA: Diagnosis not present

## 2022-12-02 DIAGNOSIS — M25531 Pain in right wrist: Secondary | ICD-10-CM | POA: Diagnosis not present

## 2022-12-02 DIAGNOSIS — Z9884 Bariatric surgery status: Secondary | ICD-10-CM | POA: Diagnosis not present

## 2022-12-02 DIAGNOSIS — Z79899 Other long term (current) drug therapy: Secondary | ICD-10-CM | POA: Diagnosis not present

## 2022-12-02 LAB — COMPREHENSIVE METABOLIC PANEL
Albumin: 4.3 (ref 3.5–5.0)
Calcium: 8.7 (ref 8.7–10.7)
Globulin: 2.4
eGFR: 96

## 2022-12-02 LAB — BUN/CREATININE RATIO: BUN/Creatinine Ratio: 21

## 2022-12-02 LAB — BASIC METABOLIC PANEL
BUN: 16 (ref 4–21)
CO2: 23 — AB (ref 13–22)
Chloride: 104 (ref 99–108)
Creatinine: 0.8 (ref 0.5–1.1)
Glucose: 78
Potassium: 4.3 mEq/L (ref 3.5–5.1)
Sodium: 143 (ref 137–147)

## 2022-12-02 LAB — HEPATIC FUNCTION PANEL
ALT: 44 U/L — AB (ref 7–35)
AST: 47 — AB (ref 13–35)
Alkaline Phosphatase: 120 (ref 25–125)
Bilirubin, Total: 0.5

## 2022-12-02 LAB — TSH: TSH: 2.27 (ref 0.41–5.90)

## 2022-12-02 LAB — VITAMIN D 25 HYDROXY (VIT D DEFICIENCY, FRACTURES): Vit D, 25-Hydroxy: 117

## 2022-12-02 LAB — PTH, INTACT: PTH, Intact: 20

## 2022-12-02 NOTE — Telephone Encounter (Signed)
Patient called with questions about her Amovig, PA.

## 2022-12-02 NOTE — Telephone Encounter (Signed)
Per patient her PA is done but her medication cost is too expensive so she has reached out to [employer] to get assistance.  Patient faxed over the forms to set over with Provider portion.   Advised Dr.Jaffe is out of the office right now it is his half day. But he will be in the office tomorrow, and I will have him sign it then and faxed over.

## 2022-12-03 DIAGNOSIS — R32 Unspecified urinary incontinence: Secondary | ICD-10-CM | POA: Diagnosis not present

## 2022-12-03 DIAGNOSIS — M50323 Other cervical disc degeneration at C6-C7 level: Secondary | ICD-10-CM | POA: Diagnosis not present

## 2022-12-03 DIAGNOSIS — M5451 Vertebrogenic low back pain: Secondary | ICD-10-CM | POA: Diagnosis not present

## 2022-12-03 DIAGNOSIS — M5137 Other intervertebral disc degeneration, lumbosacral region: Secondary | ICD-10-CM | POA: Diagnosis not present

## 2022-12-03 DIAGNOSIS — M47819 Spondylosis without myelopathy or radiculopathy, site unspecified: Secondary | ICD-10-CM | POA: Diagnosis not present

## 2022-12-03 DIAGNOSIS — M545 Low back pain, unspecified: Secondary | ICD-10-CM | POA: Diagnosis not present

## 2022-12-03 DIAGNOSIS — M50322 Other cervical disc degeneration at C5-C6 level: Secondary | ICD-10-CM | POA: Diagnosis not present

## 2022-12-03 DIAGNOSIS — S069XAS Unspecified intracranial injury with loss of consciousness status unknown, sequela: Secondary | ICD-10-CM | POA: Diagnosis not present

## 2022-12-03 DIAGNOSIS — M47816 Spondylosis without myelopathy or radiculopathy, lumbar region: Secondary | ICD-10-CM | POA: Diagnosis not present

## 2022-12-03 DIAGNOSIS — M5135 Other intervertebral disc degeneration, thoracolumbar region: Secondary | ICD-10-CM | POA: Diagnosis not present

## 2022-12-03 DIAGNOSIS — M47812 Spondylosis without myelopathy or radiculopathy, cervical region: Secondary | ICD-10-CM | POA: Diagnosis not present

## 2022-12-03 DIAGNOSIS — M542 Cervicalgia: Secondary | ICD-10-CM | POA: Diagnosis not present

## 2022-12-03 DIAGNOSIS — M25531 Pain in right wrist: Secondary | ICD-10-CM | POA: Diagnosis not present

## 2022-12-03 DIAGNOSIS — M5136 Other intervertebral disc degeneration, lumbar region: Secondary | ICD-10-CM | POA: Diagnosis not present

## 2022-12-03 DIAGNOSIS — M50321 Other cervical disc degeneration at C4-C5 level: Secondary | ICD-10-CM | POA: Diagnosis not present

## 2022-12-03 DIAGNOSIS — F331 Major depressive disorder, recurrent, moderate: Secondary | ICD-10-CM | POA: Diagnosis not present

## 2022-12-03 NOTE — Telephone Encounter (Signed)
Pt called in wanting an update on her [employer] assistance. She states it is urgent.

## 2022-12-03 NOTE — Telephone Encounter (Signed)
Forms filled out and faxed off. Patient advised.   If the form is not approved patient advised we have samples.

## 2022-12-07 NOTE — Telephone Encounter (Signed)
She may pick up a sample

## 2022-12-07 NOTE — Telephone Encounter (Signed)
Pt called in wanting to pick up Amovig samples. She called the company and they will not be finished processing her form for a few more days.

## 2022-12-09 ENCOUNTER — Encounter: Payer: Self-pay | Admitting: Physician Assistant

## 2022-12-09 LAB — PROTEIN, TOTAL: Total Protein: 6.7 g/dL

## 2022-12-10 DIAGNOSIS — M5451 Vertebrogenic low back pain: Secondary | ICD-10-CM | POA: Diagnosis not present

## 2022-12-10 DIAGNOSIS — R32 Unspecified urinary incontinence: Secondary | ICD-10-CM | POA: Diagnosis not present

## 2022-12-10 DIAGNOSIS — M25531 Pain in right wrist: Secondary | ICD-10-CM | POA: Diagnosis not present

## 2022-12-10 DIAGNOSIS — M542 Cervicalgia: Secondary | ICD-10-CM | POA: Diagnosis not present

## 2022-12-14 DIAGNOSIS — M25531 Pain in right wrist: Secondary | ICD-10-CM | POA: Diagnosis not present

## 2022-12-14 DIAGNOSIS — M542 Cervicalgia: Secondary | ICD-10-CM | POA: Diagnosis not present

## 2022-12-14 DIAGNOSIS — M5451 Vertebrogenic low back pain: Secondary | ICD-10-CM | POA: Diagnosis not present

## 2022-12-14 DIAGNOSIS — R32 Unspecified urinary incontinence: Secondary | ICD-10-CM | POA: Diagnosis not present

## 2022-12-14 NOTE — Telephone Encounter (Signed)
PA approval faxed to [employer] as requested.

## 2022-12-15 DIAGNOSIS — D485 Neoplasm of uncertain behavior of skin: Secondary | ICD-10-CM | POA: Diagnosis not present

## 2022-12-20 ENCOUNTER — Telehealth: Payer: Self-pay | Admitting: Neurology

## 2022-12-20 MED ORDER — AIMOVIG 140 MG/ML ~~LOC~~ SOAJ: SUBCUTANEOUS | 11 refills | Status: DC

## 2022-12-20 NOTE — Telephone Encounter (Signed)
Pt called in stating there was a typo on her name with the [employer] patient assistance. She needs a new prescription for her Aimovig sent to them with her name spelled correctly. Their fax is [phone].

## 2022-12-20 NOTE — Telephone Encounter (Signed)
New script sent Via epic and paper.

## 2022-12-21 DIAGNOSIS — H50012 Monocular esotropia, left eye: Secondary | ICD-10-CM | POA: Diagnosis not present

## 2022-12-21 DIAGNOSIS — H5000 Unspecified esotropia: Secondary | ICD-10-CM | POA: Diagnosis not present

## 2022-12-23 DIAGNOSIS — M533 Sacrococcygeal disorders, not elsewhere classified: Secondary | ICD-10-CM | POA: Diagnosis not present

## 2022-12-24 DIAGNOSIS — M7918 Myalgia, other site: Secondary | ICD-10-CM | POA: Diagnosis not present

## 2022-12-24 DIAGNOSIS — M47812 Spondylosis without myelopathy or radiculopathy, cervical region: Secondary | ICD-10-CM | POA: Diagnosis not present

## 2022-12-24 DIAGNOSIS — G894 Chronic pain syndrome: Secondary | ICD-10-CM | POA: Diagnosis not present

## 2022-12-24 DIAGNOSIS — M47816 Spondylosis without myelopathy or radiculopathy, lumbar region: Secondary | ICD-10-CM | POA: Diagnosis not present

## 2022-12-24 DIAGNOSIS — M533 Sacrococcygeal disorders, not elsewhere classified: Secondary | ICD-10-CM | POA: Diagnosis not present

## 2022-12-24 DIAGNOSIS — M5412 Radiculopathy, cervical region: Secondary | ICD-10-CM | POA: Diagnosis not present

## 2022-12-29 ENCOUNTER — Ambulatory Visit: Payer: Medicare HMO

## 2022-12-30 DIAGNOSIS — H5213 Myopia, bilateral: Secondary | ICD-10-CM | POA: Diagnosis not present

## 2022-12-30 DIAGNOSIS — H52222 Regular astigmatism, left eye: Secondary | ICD-10-CM | POA: Diagnosis not present

## 2022-12-30 DIAGNOSIS — H524 Presbyopia: Secondary | ICD-10-CM | POA: Diagnosis not present

## 2022-12-31 DIAGNOSIS — R32 Unspecified urinary incontinence: Secondary | ICD-10-CM | POA: Diagnosis not present

## 2022-12-31 DIAGNOSIS — M5451 Vertebrogenic low back pain: Secondary | ICD-10-CM | POA: Diagnosis not present

## 2022-12-31 DIAGNOSIS — M25531 Pain in right wrist: Secondary | ICD-10-CM | POA: Diagnosis not present

## 2022-12-31 DIAGNOSIS — M542 Cervicalgia: Secondary | ICD-10-CM | POA: Diagnosis not present

## 2023-01-03 DIAGNOSIS — R32 Unspecified urinary incontinence: Secondary | ICD-10-CM | POA: Diagnosis not present

## 2023-01-03 DIAGNOSIS — M25531 Pain in right wrist: Secondary | ICD-10-CM | POA: Diagnosis not present

## 2023-01-03 DIAGNOSIS — M5451 Vertebrogenic low back pain: Secondary | ICD-10-CM | POA: Diagnosis not present

## 2023-01-03 DIAGNOSIS — M542 Cervicalgia: Secondary | ICD-10-CM | POA: Diagnosis not present

## 2023-01-05 DIAGNOSIS — G4730 Sleep apnea, unspecified: Secondary | ICD-10-CM | POA: Diagnosis not present

## 2023-01-05 DIAGNOSIS — M25531 Pain in right wrist: Secondary | ICD-10-CM | POA: Diagnosis not present

## 2023-01-05 DIAGNOSIS — R32 Unspecified urinary incontinence: Secondary | ICD-10-CM | POA: Diagnosis not present

## 2023-01-05 DIAGNOSIS — M5451 Vertebrogenic low back pain: Secondary | ICD-10-CM | POA: Diagnosis not present

## 2023-01-05 DIAGNOSIS — F909 Attention-deficit hyperactivity disorder, unspecified type: Secondary | ICD-10-CM | POA: Diagnosis not present

## 2023-01-05 DIAGNOSIS — M542 Cervicalgia: Secondary | ICD-10-CM | POA: Diagnosis not present

## 2023-01-05 DIAGNOSIS — F339 Major depressive disorder, recurrent, unspecified: Secondary | ICD-10-CM | POA: Diagnosis not present

## 2023-01-06 ENCOUNTER — Ambulatory Visit: Payer: Medicare HMO

## 2023-01-12 DIAGNOSIS — M25531 Pain in right wrist: Secondary | ICD-10-CM | POA: Diagnosis not present

## 2023-01-12 DIAGNOSIS — M542 Cervicalgia: Secondary | ICD-10-CM | POA: Diagnosis not present

## 2023-01-12 DIAGNOSIS — R32 Unspecified urinary incontinence: Secondary | ICD-10-CM | POA: Diagnosis not present

## 2023-01-12 DIAGNOSIS — M5451 Vertebrogenic low back pain: Secondary | ICD-10-CM | POA: Diagnosis not present

## 2023-01-14 DIAGNOSIS — M5451 Vertebrogenic low back pain: Secondary | ICD-10-CM | POA: Diagnosis not present

## 2023-01-14 DIAGNOSIS — M542 Cervicalgia: Secondary | ICD-10-CM | POA: Diagnosis not present

## 2023-01-14 DIAGNOSIS — M25531 Pain in right wrist: Secondary | ICD-10-CM | POA: Diagnosis not present

## 2023-01-14 DIAGNOSIS — R32 Unspecified urinary incontinence: Secondary | ICD-10-CM | POA: Diagnosis not present

## 2023-01-17 DIAGNOSIS — M25531 Pain in right wrist: Secondary | ICD-10-CM | POA: Diagnosis not present

## 2023-01-17 DIAGNOSIS — M47812 Spondylosis without myelopathy or radiculopathy, cervical region: Secondary | ICD-10-CM | POA: Diagnosis not present

## 2023-01-17 DIAGNOSIS — M542 Cervicalgia: Secondary | ICD-10-CM | POA: Diagnosis not present

## 2023-01-17 DIAGNOSIS — G894 Chronic pain syndrome: Secondary | ICD-10-CM | POA: Diagnosis not present

## 2023-01-17 DIAGNOSIS — M5451 Vertebrogenic low back pain: Secondary | ICD-10-CM | POA: Diagnosis not present

## 2023-01-17 DIAGNOSIS — M533 Sacrococcygeal disorders, not elsewhere classified: Secondary | ICD-10-CM | POA: Diagnosis not present

## 2023-01-17 DIAGNOSIS — M7918 Myalgia, other site: Secondary | ICD-10-CM | POA: Diagnosis not present

## 2023-01-17 DIAGNOSIS — M47816 Spondylosis without myelopathy or radiculopathy, lumbar region: Secondary | ICD-10-CM | POA: Diagnosis not present

## 2023-01-17 DIAGNOSIS — M5412 Radiculopathy, cervical region: Secondary | ICD-10-CM | POA: Diagnosis not present

## 2023-01-17 DIAGNOSIS — R32 Unspecified urinary incontinence: Secondary | ICD-10-CM | POA: Diagnosis not present

## 2023-01-18 DIAGNOSIS — L942 Calcinosis cutis: Secondary | ICD-10-CM | POA: Diagnosis not present

## 2023-01-19 DIAGNOSIS — M5451 Vertebrogenic low back pain: Secondary | ICD-10-CM | POA: Diagnosis not present

## 2023-01-19 DIAGNOSIS — M542 Cervicalgia: Secondary | ICD-10-CM | POA: Diagnosis not present

## 2023-01-19 DIAGNOSIS — M25531 Pain in right wrist: Secondary | ICD-10-CM | POA: Diagnosis not present

## 2023-01-19 DIAGNOSIS — R32 Unspecified urinary incontinence: Secondary | ICD-10-CM | POA: Diagnosis not present

## 2023-01-25 DIAGNOSIS — L942 Calcinosis cutis: Secondary | ICD-10-CM | POA: Diagnosis not present

## 2023-01-27 DIAGNOSIS — R32 Unspecified urinary incontinence: Secondary | ICD-10-CM | POA: Diagnosis not present

## 2023-01-27 DIAGNOSIS — M25531 Pain in right wrist: Secondary | ICD-10-CM | POA: Diagnosis not present

## 2023-01-27 DIAGNOSIS — M542 Cervicalgia: Secondary | ICD-10-CM | POA: Diagnosis not present

## 2023-01-27 DIAGNOSIS — M5451 Vertebrogenic low back pain: Secondary | ICD-10-CM | POA: Diagnosis not present

## 2023-01-31 DIAGNOSIS — M25531 Pain in right wrist: Secondary | ICD-10-CM | POA: Diagnosis not present

## 2023-01-31 DIAGNOSIS — R32 Unspecified urinary incontinence: Secondary | ICD-10-CM | POA: Diagnosis not present

## 2023-01-31 DIAGNOSIS — M5451 Vertebrogenic low back pain: Secondary | ICD-10-CM | POA: Diagnosis not present

## 2023-01-31 DIAGNOSIS — M542 Cervicalgia: Secondary | ICD-10-CM | POA: Diagnosis not present

## 2023-02-04 DIAGNOSIS — R32 Unspecified urinary incontinence: Secondary | ICD-10-CM | POA: Diagnosis not present

## 2023-02-04 DIAGNOSIS — M25531 Pain in right wrist: Secondary | ICD-10-CM | POA: Diagnosis not present

## 2023-02-04 DIAGNOSIS — M542 Cervicalgia: Secondary | ICD-10-CM | POA: Diagnosis not present

## 2023-02-04 DIAGNOSIS — M5451 Vertebrogenic low back pain: Secondary | ICD-10-CM | POA: Diagnosis not present

## 2023-02-07 DIAGNOSIS — M25531 Pain in right wrist: Secondary | ICD-10-CM | POA: Diagnosis not present

## 2023-02-07 DIAGNOSIS — R32 Unspecified urinary incontinence: Secondary | ICD-10-CM | POA: Diagnosis not present

## 2023-02-07 DIAGNOSIS — M542 Cervicalgia: Secondary | ICD-10-CM | POA: Diagnosis not present

## 2023-02-07 DIAGNOSIS — M5451 Vertebrogenic low back pain: Secondary | ICD-10-CM | POA: Diagnosis not present

## 2023-02-13 DIAGNOSIS — Z0100 Encounter for examination of eyes and vision without abnormal findings: Secondary | ICD-10-CM | POA: Diagnosis not present

## 2023-02-15 DIAGNOSIS — M5451 Vertebrogenic low back pain: Secondary | ICD-10-CM | POA: Diagnosis not present

## 2023-02-15 DIAGNOSIS — M25531 Pain in right wrist: Secondary | ICD-10-CM | POA: Diagnosis not present

## 2023-02-15 DIAGNOSIS — M542 Cervicalgia: Secondary | ICD-10-CM | POA: Diagnosis not present

## 2023-02-15 DIAGNOSIS — R32 Unspecified urinary incontinence: Secondary | ICD-10-CM | POA: Diagnosis not present

## 2023-02-21 DIAGNOSIS — R32 Unspecified urinary incontinence: Secondary | ICD-10-CM | POA: Diagnosis not present

## 2023-02-21 DIAGNOSIS — M542 Cervicalgia: Secondary | ICD-10-CM | POA: Diagnosis not present

## 2023-02-21 DIAGNOSIS — M5451 Vertebrogenic low back pain: Secondary | ICD-10-CM | POA: Diagnosis not present

## 2023-02-21 DIAGNOSIS — M25531 Pain in right wrist: Secondary | ICD-10-CM | POA: Diagnosis not present

## 2023-03-01 DIAGNOSIS — M25531 Pain in right wrist: Secondary | ICD-10-CM | POA: Diagnosis not present

## 2023-03-01 DIAGNOSIS — M47812 Spondylosis without myelopathy or radiculopathy, cervical region: Secondary | ICD-10-CM | POA: Diagnosis not present

## 2023-03-01 DIAGNOSIS — G894 Chronic pain syndrome: Secondary | ICD-10-CM | POA: Diagnosis not present

## 2023-03-01 DIAGNOSIS — M533 Sacrococcygeal disorders, not elsewhere classified: Secondary | ICD-10-CM | POA: Diagnosis not present

## 2023-03-01 DIAGNOSIS — M47816 Spondylosis without myelopathy or radiculopathy, lumbar region: Secondary | ICD-10-CM | POA: Diagnosis not present

## 2023-03-01 DIAGNOSIS — M5451 Vertebrogenic low back pain: Secondary | ICD-10-CM | POA: Diagnosis not present

## 2023-03-01 DIAGNOSIS — M542 Cervicalgia: Secondary | ICD-10-CM | POA: Diagnosis not present

## 2023-03-01 DIAGNOSIS — M5412 Radiculopathy, cervical region: Secondary | ICD-10-CM | POA: Diagnosis not present

## 2023-03-01 DIAGNOSIS — R32 Unspecified urinary incontinence: Secondary | ICD-10-CM | POA: Diagnosis not present

## 2023-03-01 DIAGNOSIS — M7918 Myalgia, other site: Secondary | ICD-10-CM | POA: Diagnosis not present

## 2023-03-06 ENCOUNTER — Other Ambulatory Visit: Payer: Self-pay | Admitting: Family Medicine

## 2023-03-06 DIAGNOSIS — U071 COVID-19: Secondary | ICD-10-CM | POA: Insufficient documentation

## 2023-03-06 MED ORDER — NIRMATRELVIR/RITONAVIR (PAXLOVID)TABLET: 3.0000 | ORAL_TABLET | Freq: Two times a day (BID) | ORAL | 0 refills | Status: AC

## 2023-03-06 NOTE — Progress Notes (Signed)
Received notification by answering service, positive home Covid test. Low grade fever, productive cough, body aches, chest congestion. Advised to go to urgent care per RN, patient preferred to avoid urgent care visit and requested prescription for Paxlovid.  Medication list reviewed, instructed to hold trazodone while taking Paxlovid. GFR: 96. Follow-up with PCP as needed. If symptoms worsen, go to ED for evaluation.

## 2023-03-17 DIAGNOSIS — M25531 Pain in right wrist: Secondary | ICD-10-CM | POA: Diagnosis not present

## 2023-03-17 DIAGNOSIS — M542 Cervicalgia: Secondary | ICD-10-CM | POA: Diagnosis not present

## 2023-03-17 DIAGNOSIS — R32 Unspecified urinary incontinence: Secondary | ICD-10-CM | POA: Diagnosis not present

## 2023-03-17 DIAGNOSIS — M5451 Vertebrogenic low back pain: Secondary | ICD-10-CM | POA: Diagnosis not present

## 2023-03-28 DIAGNOSIS — E66811 Obesity, class 1: Secondary | ICD-10-CM | POA: Diagnosis not present

## 2023-04-06 DIAGNOSIS — M5451 Vertebrogenic low back pain: Secondary | ICD-10-CM | POA: Diagnosis not present

## 2023-04-06 DIAGNOSIS — R32 Unspecified urinary incontinence: Secondary | ICD-10-CM | POA: Diagnosis not present

## 2023-04-06 DIAGNOSIS — M542 Cervicalgia: Secondary | ICD-10-CM | POA: Diagnosis not present

## 2023-04-06 DIAGNOSIS — M25531 Pain in right wrist: Secondary | ICD-10-CM | POA: Diagnosis not present

## 2023-04-11 DIAGNOSIS — Z0100 Encounter for examination of eyes and vision without abnormal findings: Secondary | ICD-10-CM | POA: Diagnosis not present

## 2023-04-13 DIAGNOSIS — R32 Unspecified urinary incontinence: Secondary | ICD-10-CM | POA: Diagnosis not present

## 2023-04-13 DIAGNOSIS — M542 Cervicalgia: Secondary | ICD-10-CM | POA: Diagnosis not present

## 2023-04-13 DIAGNOSIS — M25531 Pain in right wrist: Secondary | ICD-10-CM | POA: Diagnosis not present

## 2023-04-13 DIAGNOSIS — M5451 Vertebrogenic low back pain: Secondary | ICD-10-CM | POA: Diagnosis not present
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
## 2023-04-15 ENCOUNTER — Encounter: Payer: Self-pay | Admitting: Neurology

## 2023-04-15 ENCOUNTER — Telehealth (INDEPENDENT_AMBULATORY_CARE_PROVIDER_SITE_OTHER): Payer: Medicare HMO | Admitting: Neurology

## 2023-04-15 DIAGNOSIS — G43109 Migraine with aura, not intractable, without status migrainosus: Secondary | ICD-10-CM

## 2023-04-15 DIAGNOSIS — G44229 Chronic tension-type headache, not intractable: Secondary | ICD-10-CM | POA: Diagnosis not present

## 2023-04-15 DIAGNOSIS — M542 Cervicalgia: Secondary | ICD-10-CM | POA: Diagnosis not present

## 2023-04-18 ENCOUNTER — Ambulatory Visit: Payer: Self-pay | Admitting: Physician Assistant

## 2023-04-18 ENCOUNTER — Ambulatory Visit: Payer: Medicare HMO

## 2023-04-18 ENCOUNTER — Ambulatory Visit (INDEPENDENT_AMBULATORY_CARE_PROVIDER_SITE_OTHER): Payer: Medicare HMO | Admitting: Medical-Surgical

## 2023-04-18 ENCOUNTER — Encounter: Payer: Self-pay | Admitting: Medical-Surgical

## 2023-04-18 VITALS — BP 123/71 | HR 80 | Resp 20 | Ht 65.0 in | Wt 193.4 lb

## 2023-04-18 DIAGNOSIS — J329 Chronic sinusitis, unspecified: Secondary | ICD-10-CM

## 2023-04-18 DIAGNOSIS — R059 Cough, unspecified: Secondary | ICD-10-CM

## 2023-04-18 DIAGNOSIS — R0602 Shortness of breath: Secondary | ICD-10-CM

## 2023-04-18 DIAGNOSIS — J40 Bronchitis, not specified as acute or chronic: Secondary | ICD-10-CM | POA: Diagnosis not present

## 2023-04-18 MED ORDER — DEXAMETHASONE 4 MG PO TABS: 4.0000 mg | ORAL_TABLET | Freq: Two times a day (BID) | ORAL | 0 refills | Status: DC

## 2023-04-18 MED ORDER — FLUCONAZOLE 150 MG PO TABS: 150.0000 mg | ORAL_TABLET | Freq: Once | ORAL | 0 refills | Status: AC

## 2023-04-18 MED ORDER — CEFDINIR 300 MG PO CAPS: 300.0000 mg | ORAL_CAPSULE | Freq: Two times a day (BID) | ORAL | 0 refills | Status: DC
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
## 2023-04-18 NOTE — Progress Notes (Signed)
Medical screening examination/treatment was performed by qualified clinical staff member and as supervising provider I was immediately available for consultation/collaboration. I have reviewed documentation and agree with assessment and plan.  Thayer Ohm, DNP, APRN, FNP-BC [facility]

## 2023-04-18 NOTE — Telephone Encounter (Signed)
Copied from CRM [id number]. Topic: Clinical - Red Word Triage >> Apr 18, 2023 12:41 PM Julie Johnston wrote: Red Word that prompted transfer to Nurse Triage: The patient had COVID in the beginning of December and is still not recovering. She is short and breath and has severe pain in her ribcage.   Chief Complaint: shortness of breath with exertion Symptoms: productive cough, fatigue, crackling in chest Frequency: 2 weeks Pertinent Negatives: Patient denies fever Disposition: [] ED /[] Urgent Care (no appt availability in office) / [x] Appointment(In office/virtual)/ []  [facility]/ [] Home Care/ [] Refused Recommended Disposition /[] [facility]/ []  Follow-up with PCP Additional Notes: The patient reported that she has not been feeling well since she had Covid mid December, 2024.  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  She has had "crackling" in her chest relieved by her inhaler.  She has a productive cough with yellow sputum, rib pain due to coughing, and has felt fatigue.  She was scheduled for a same day appointment for further evaluation.  Reason for Disposition  [1] MILD difficulty breathing (e.g., minimal/no SOB at rest, SOB with walking, pulse <100) AND [2] NEW-onset or WORSE than normal  Answer Assessment - Initial Assessment Questions 1. RESPIRATORY STATUS: "Describe your breathing?" (e.g., wheezing, shortness of breath, unable to speak, severe coughing)      Shortness of breath  2. ONSET: "When did this breathing problem begin?"      Last 2 weeks  3. PATTERN "Does the difficult breathing come and go, or has it been constant since it started?"      Constant  4. SEVERITY: "How bad is your breathing?" (e.g., mild, moderate, severe)    - MILD: No SOB at rest, mild SOB with walking, speaks normally in sentences, can lie down, no retractions, pulse < 100.     - MODERATE: SOB at rest, SOB with minimal exertion and prefers to sit, cannot lie down flat, speaks in phrases, mild retractions, audible wheezing, pulse 100-120.    - SEVERE: Very SOB at rest, speaks in single words, struggling to breathe, sitting hunched forward, retractions, pulse > 120      Just feels more winded than usual  5. RECURRENT SYMPTOM: "Have you had difficulty breathing before?" If Yes, ask: "When was the last time?" and "What happened that time?"      Have not felt well since being diagnosed with Covid 03/09/2023 6. CARDIAC HISTORY: "Do you have any history of heart disease?" (e.g., heart attack, angina, bypass surgery, angioplasty)      None  7. LUNG HISTORY: "Do you have any history of lung disease?"  (e.g., pulmonary embolus, asthma, emphysema)     None  8. CAUSE: "What do you think is causing the breathing problem?"      Possible residual due to covid  9. OTHER SYMPTOMS: "Do you have any other symptoms? (e.g., dizziness, runny nose, cough, chest pain, fever)     Crackling in chest - inhaler helps  Rib pain Fatigue Sinus congestion - headache  Ears are pain  Hoarse  10. O2 SATURATION MONITOR:  "Do you use an oxygen saturation monitor (pulse oximeter) at home?" If Yes, ask: "What is your reading (oxygen level) today?" "What is your usual oxygen saturation reading?" (e.g., 95%)       Does not own one  Protocols used: Breathing Difficulty-A-AH

## 2023-04-19 NOTE — Telephone Encounter (Signed)
Patient seen in office yesterday 04/18/23 by Christen Butter, NP

## 2023-04-20 ENCOUNTER — Encounter: Payer: Self-pay | Admitting: Medical-Surgical

## 2023-04-20 DIAGNOSIS — Z20822 Contact with and (suspected) exposure to covid-19: Secondary | ICD-10-CM

## 2023-04-20 DIAGNOSIS — R059 Cough, unspecified: Secondary | ICD-10-CM

## 2023-04-20 MED ORDER — ALBUTEROL SULFATE HFA 108 (90 BASE) MCG/ACT IN AERS: 1.0000 | INHALATION_SPRAY | Freq: Four times a day (QID) | RESPIRATORY_TRACT | 1 refills | Status: AC | PRN

## 2023-04-20 NOTE — Telephone Encounter (Signed)
Changed order to stat

## 2023-04-21 ENCOUNTER — Encounter: Payer: Self-pay | Admitting: Medical-Surgical

## 2023-04-21 DIAGNOSIS — M47812 Spondylosis without myelopathy or radiculopathy, cervical region: Secondary | ICD-10-CM | POA: Diagnosis not present

## 2023-04-24 ENCOUNTER — Other Ambulatory Visit: Payer: Self-pay | Admitting: Neurology

## 2023-04-25 ENCOUNTER — Encounter: Payer: Self-pay | Admitting: Medical-Surgical

## 2023-04-25 ENCOUNTER — Ambulatory Visit: Payer: Self-pay | Admitting: Physician Assistant

## 2023-04-25 DIAGNOSIS — M5412 Radiculopathy, cervical region: Secondary | ICD-10-CM | POA: Diagnosis not present

## 2023-04-25 DIAGNOSIS — M7918 Myalgia, other site: Secondary | ICD-10-CM | POA: Diagnosis not present

## 2023-04-25 DIAGNOSIS — G894 Chronic pain syndrome: Secondary | ICD-10-CM | POA: Diagnosis not present

## 2023-04-25 DIAGNOSIS — M47812 Spondylosis without myelopathy or radiculopathy, cervical region: Secondary | ICD-10-CM | POA: Diagnosis not present

## 2023-04-25 DIAGNOSIS — M47816 Spondylosis without myelopathy or radiculopathy, lumbar region: Secondary | ICD-10-CM | POA: Diagnosis not present

## 2023-04-25 DIAGNOSIS — M533 Sacrococcygeal disorders, not elsewhere classified: Secondary | ICD-10-CM | POA: Diagnosis not present

## 2023-04-25 NOTE — Telephone Encounter (Addendum)
Answer Assessment - Initial Assessment Questions 1. NAME of MEDICINE: "What medicine(s) are you calling about?"     Cefdinir  2. QUESTION: "What is your question?" (e.g., double dose of medicine, side effect)     Frequent night sweats since starting the medication  3. PRESCRIBER: "Who prescribed the medicine?" Reason: if prescribed by specialist, call should be referred to that group.     Christen Butter, NP  4. SYMPTOMS: "Do you have any symptoms?" If Yes, ask: "What symptoms are you having?"  "How bad are the symptoms (e.g., mild, moderate, severe)     Night sweats, drenched in sweat.  Patient stated that she was recently in the office and received treatment for bronchitis. Patient stated that she completed the Dexamethasone prescription. She has been taking Cefdinir each day as prescribed since 1/27, but she feels like it could be causing her to have night sweats and being drenched in sweat. The night sweats have occurred for 4-5 nights. Patient is questioning if this is related to the medication. If so, she does not want to continue taking it. Patient also stated that she still feels congested. Patient is aware that a message will be sent and the office will follow up with her.  Protocols used: Medication Question Call-A-AH   *Patient denies elevated temperature. Her last temp was 98.7

## 2023-04-26 ENCOUNTER — Ambulatory Visit (INDEPENDENT_AMBULATORY_CARE_PROVIDER_SITE_OTHER): Payer: Medicare HMO | Admitting: Physician Assistant

## 2023-04-26 ENCOUNTER — Ambulatory Visit: Payer: Medicare HMO

## 2023-04-26 ENCOUNTER — Encounter: Payer: Self-pay | Admitting: Physician Assistant

## 2023-04-26 VITALS — BP 110/78 | HR 86 | Temp 97.8°F | Ht 65.0 in | Wt 192.0 lb

## 2023-04-26 DIAGNOSIS — R0602 Shortness of breath: Secondary | ICD-10-CM | POA: Diagnosis not present

## 2023-04-26 DIAGNOSIS — R0789 Other chest pain: Secondary | ICD-10-CM | POA: Insufficient documentation

## 2023-04-26 DIAGNOSIS — Z8616 Personal history of COVID-19: Secondary | ICD-10-CM

## 2023-04-26 DIAGNOSIS — R059 Cough, unspecified: Secondary | ICD-10-CM

## 2023-04-26 DIAGNOSIS — R61 Generalized hyperhidrosis: Secondary | ICD-10-CM | POA: Diagnosis not present

## 2023-04-26 DIAGNOSIS — R232 Flushing: Secondary | ICD-10-CM | POA: Diagnosis not present

## 2023-04-26 LAB — CK TOTAL AND CKMB (NOT AT ARMC)
CK-MB Index: 2.1 ng/mL (ref 0.0–5.3)
Total CK: 78 U/L (ref 32–182)

## 2023-04-26 NOTE — Progress Notes (Signed)
 Acute Office Visit  Subjective:     Patient ID: Julie Johnston, female    DOB: 1970/10/03, 53 y.o.   MRN: [id number]  Chief Complaint  Patient presents with   Cough    HPI Patient is in today because she feels like she is not getting better since covid in December 2024. She treated symptomatically until middle January where she came to clinic and saw NP who gave her cefdinir  and decadron  for sinobronchitis. She has finished both. She is having profuse night sweats and hot flashes throughout the day. She is fatigued and short of breath with chest tightness. She denies any fever or body aches. 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   No recent travel but she has been around a lot of sick people.   Not stopped or started any new medications recently.   .. Active Ambulatory Problems    Diagnosis Date Noted   Lyme disease 05/09/2015   Insomnia 05/09/2015   Palpitations 05/09/2015   Chronic fatigue 05/09/2015   No energy 05/09/2015   Hyperlipidemia 05/09/2015   Pap smear abnormality of cervix with LGSIL 02/07/2014   Attention deficit hyperactivity disorder (ADHD), predominantly inattentive type 05/09/2015   Major depressive disorder    Obstructive sleep apnea 06/18/2015   Fibromyalgia 09/22/2015   Drug-induced weight gain 02/23/2016   Migraine 11/29/2017   Frequent infections 05/08/2018   Decreased thyroid  stimulating hormone (TSH) level 05/08/2018   Equivocal stress echocardiogram 05/17/2018   Essential hypertension 06/04/2019   Post concussion syndrome 10/01/2019   Acute thoracic back pain 10/01/2019   PTSD (post-traumatic stress disorder) 10/01/2019   Generalized anxiety disorder 11/07/2019   Mild traumatic brain injury 09/12/2019   Pulmonary collapse 06/10/2014   Polyp of transverse colon 02/25/2020   Diverticulosis of colon 02/25/2020   Internal hemorrhoids 02/25/2020   Dizziness 05/19/2020   Double vision 05/19/2020   Vertigo  05/19/2020   Severe obesity (BMI >= 40) (HCC) 05/27/2020   Other abnormalities of gait and mobility 06/02/2020   Acute intractable headache 08/01/2020   Post-concussion headache 08/04/2020   Parotiditis 08/14/2020   History of Roux-en-Y gastric bypass 11/18/2020   Paresthesia 11/18/2020   Non-alcoholic fatty liver disease 11/25/2020   Intractable migraine with aura with status migrainosus 12/16/2020   Elevated liver enzymes 12/17/2020   Screen for STD (sexually transmitted disease) 08/19/2021   Cellulitis of right upper extremity 01/19/2022   Cognitive change 10/26/2019   Concussion without loss of consciousness 10/26/2019   Post-operative nausea and vomiting 08/29/2020   TMJ pain dysfunction syndrome 01/09/2021   Adjustment disorder with mixed anxiety and depressed mood 10/26/2019   Intertrigo 06/15/2022   Excess skin of abdominal wall 06/15/2022   GAD (generalized anxiety disorder) 06/15/2022   Chronic bilateral low back pain without sciatica 09/08/2022   DDD (degenerative disc disease), lumbar 09/08/2022   DDD (degenerative disc disease), cervical 09/28/2022   Other osteoporosis without current pathological fracture 11/19/2022   MDD (major depressive disorder), recurrent episode, severe (HCC) 11/19/2022   Epidermal cyst 11/19/2022   COVID-19 virus infection 03/06/2023   History of COVID-19 04/26/2023   SOB (shortness of breath) 04/26/2023   Hot flashes 04/26/2023   Night sweats 04/26/2023   Chest tightness 04/26/2023   Resolved Ambulatory Problems    Diagnosis Date Noted   Pre-diabetes 05/14/2015   Urine ketones 09/22/2015   Obesity 09/22/2015   Dysuria 11/29/2017   Influenza B 12/16/2017   Potential exposure to STD  12/16/2017   Influenza-like illness 07/20/2018   Right posterior sixth rib fracture 10/02/2019   Controlled type 2 diabetes mellitus with complication, without long-term current use of insulin (HCC) 05/27/2020   Unsteadiness on feet 06/02/2020   Head  injury 08/01/2020   Parotid swelling 08/13/2020   Ear fullness, right 08/14/2020   Foreign body (FB) in soft tissue 09/19/2020   Current hepatitis A infection 11/25/2020   Adhesive capsulitis of left shoulder 04/18/2017   Closed torus fracture of lower end of left fibula 09/20/2018   Foot sprain, right, initial encounter 08/21/2018   Past Medical History:  Diagnosis Date   Allergy    Arthritis    Obesity (BMI 35.0-39.9 without comorbidity)      ROS See HPI.      Objective:    BP 110/78   Pulse 86   Temp 97.8 F (36.6 C)   Ht 5' 5 (1.651 m)   Wt 192 lb (87.1 kg)   SpO2 99%   BMI 31.95 kg/m  BP Readings from Last 3 Encounters:  04/26/23 110/78  04/18/23 123/71  11/19/22 124/74   Wt Readings from Last 3 Encounters:  04/26/23 192 lb (87.1 kg)  04/18/23 193 lb 6.4 oz (87.7 kg)  11/19/22 179 lb (81.2 kg)      EKG- NSR, no ST depression or elevation, non-specific T wave abnormality  Physical Exam Constitutional:      Appearance: She is ill-appearing and diaphoretic.  HENT:     Head: Normocephalic.     Right Ear: Tympanic membrane, ear canal and external ear normal. There is no impacted cerumen.     Left Ear: Tympanic membrane, ear canal and external ear normal. There is no impacted cerumen.     Nose: Nose normal.     Mouth/Throat:     Mouth: Mucous membranes are moist.     Pharynx: No oropharyngeal exudate or posterior oropharyngeal erythema.  Eyes:     Extraocular Movements: Extraocular movements intact.     Conjunctiva/sclera: Conjunctivae normal.     Pupils: Pupils are equal, round, and reactive to light.  Skin:    Findings: No rash.  Neurological:     General: No focal deficit present.     Mental Status: She is alert and oriented to person, place, and time.  Psychiatric:     Comments: Anxious          Assessment & Plan:  SABRASABRAAshlie Johnston was seen today for cough.  Diagnoses and all orders for this visit:  SOB (shortness of breath) -      CMP14+EGFR -     QuantiFERON-TB Gold Plus -     TSH + free T4 -     Troponin T -     CK total and CKMB (cardiac)not at [facility] -     DG Chest 2 View; Future -     C-reactive protein -     CK Total (and CKMB)  Night sweats -     CMP14+EGFR -     QuantiFERON-TB Gold Plus -     TSH + free T4 -     Troponin T -     CK total and CKMB (cardiac)not at [facility] -     DG Chest 2 View; Future -     C-reactive protein -     CK Total (and CKMB)  Hot flashes -     CMP14+EGFR -     QuantiFERON-TB Gold Plus -     TSH + free  T4 -     Troponin T -     CK total and CKMB (cardiac)not at [facility] -     DG Chest 2 View; Future -     C-reactive protein -     CK Total (and CKMB)  History of COVID-19 -     CMP14+EGFR -     QuantiFERON-TB Gold Plus -     TSH + free T4 -     Troponin T -     CK total and CKMB (cardiac)not at [facility] -     DG Chest 2 View; Future -     C-reactive protein -     CK Total (and CKMB)  Chest tightness -     CMP14+EGFR -     QuantiFERON-TB Gold Plus -     TSH + free T4 -     Troponin T -     CK total and CKMB (cardiac)not at [facility] -     DG Chest 2 View; Future -     C-reactive protein -     CK Total (and CKMB)   Unclear etiology of symptoms like post viral or even post covid symptoms Vitals reassuring Will test for TB due to the sweating and cough. Since having chest tightness ordered EKG that was overall reassuring.  Will get cardiac enzymes CXR stat showed no pneumonia or any acute findings Continue to use albuterol  as needed Rest and hydrate Use delsym for cough Will call with labs and go from there   Vermell Bologna, PA-C

## 2023-04-26 NOTE — Patient Instructions (Signed)
Get CXR and get labs

## 2023-04-26 NOTE — Progress Notes (Signed)
LUNGS look GREAT. No abnormality.

## 2023-04-27 ENCOUNTER — Other Ambulatory Visit: Payer: Self-pay | Admitting: Emergency Medicine

## 2023-04-27 ENCOUNTER — Encounter: Payer: Self-pay | Admitting: Physician Assistant

## 2023-04-27 DIAGNOSIS — R0602 Shortness of breath: Secondary | ICD-10-CM

## 2023-04-27 DIAGNOSIS — R61 Generalized hyperhidrosis: Secondary | ICD-10-CM | POA: Insufficient documentation

## 2023-04-27 DIAGNOSIS — R0789 Other chest pain: Secondary | ICD-10-CM

## 2023-04-27 NOTE — Progress Notes (Signed)
 Can we call lab and see about adding a d-dimer to the blood.

## 2023-04-27 NOTE — Progress Notes (Signed)
 Normal CK. Waiting on the others to result.

## 2023-04-28 ENCOUNTER — Encounter: Payer: Self-pay | Admitting: Physician Assistant

## 2023-04-29 MED ORDER — PREDNISONE 20 MG PO TABS: ORAL_TABLET | ORAL | 0 refills | Status: DC

## 2023-04-29 NOTE — Addendum Note (Signed)
 Addended by: Doretha Ganja on: 04/29/2023 03:00 PM   Modules accepted: Orders

## 2023-04-30 LAB — CMP14+EGFR
ALT: 66 [IU]/L — ABNORMAL HIGH (ref 0–32)
AST: 37 [IU]/L (ref 0–40)
Albumin: 4 g/dL (ref 3.8–4.9)
Alkaline Phosphatase: 97 [IU]/L (ref 44–121)
BUN/Creatinine Ratio: 24 — ABNORMAL HIGH (ref 9–23)
BUN: 17 mg/dL (ref 6–24)
Bilirubin Total: 0.6 mg/dL (ref 0.0–1.2)
CO2: 23 mmol/L (ref 20–29)
Calcium: 9 mg/dL (ref 8.7–10.2)
Chloride: 103 mmol/L (ref 96–106)
Creatinine, Ser: 0.72 mg/dL (ref 0.57–1.00)
Globulin, Total: 2.4 g/dL (ref 1.5–4.5)
Glucose: 54 mg/dL — ABNORMAL LOW (ref 70–99)
Potassium: 4.1 mmol/L (ref 3.5–5.2)
Sodium: 142 mmol/L (ref 134–144)
Total Protein: 6.4 g/dL (ref 6.0–8.5)
eGFR: 100 mL/min/{1.73_m2} (ref >59)

## 2023-04-30 LAB — QUANTIFERON-TB GOLD PLUS
QuantiFERON Mitogen Value: >10 [IU]/mL
QuantiFERON Nil Value: 0.01 [IU]/mL
QuantiFERON TB1 Ag Value: 0.02 [IU]/mL
QuantiFERON TB2 Ag Value: 0.01 [IU]/mL
QuantiFERON-TB Gold Plus: NEGATIVE

## 2023-04-30 LAB — CK TOTAL AND CKMB (NOT AT ARMC)
CK-MB Index: 1.9 ng/mL (ref 0.0–5.3)
Total CK: 73 U/L (ref 32–182)

## 2023-04-30 LAB — C-REACTIVE PROTEIN: CRP: <1 mg/L (ref 0–10)

## 2023-04-30 LAB — TSH+FREE T4
Free T4: 1.56 ng/dL (ref 0.82–1.77)
TSH: 2.97 u[IU]/mL (ref 0.450–4.500)

## 2023-04-30 LAB — TROPONIN T: Troponin T (Highly Sensitive): <6 ng/L (ref 0–14)

## 2023-05-02 ENCOUNTER — Other Ambulatory Visit: Payer: Self-pay

## 2023-05-02 ENCOUNTER — Emergency Department (HOSPITAL_BASED_OUTPATIENT_CLINIC_OR_DEPARTMENT_OTHER)
Admission: EM | Admit: 2023-05-02 | Discharge: 2023-05-03 | Disposition: A | Discharge status: Home / Self Care | Payer: Medicare HMO | Attending: Emergency Medicine | Admitting: Emergency Medicine

## 2023-05-02 ENCOUNTER — Other Ambulatory Visit: Payer: Self-pay | Admitting: Physician Assistant

## 2023-05-02 ENCOUNTER — Encounter (HOSPITAL_BASED_OUTPATIENT_CLINIC_OR_DEPARTMENT_OTHER): Payer: Self-pay

## 2023-05-02 ENCOUNTER — Encounter: Payer: Self-pay | Admitting: Physician Assistant

## 2023-05-02 ENCOUNTER — Ambulatory Visit (INDEPENDENT_AMBULATORY_CARE_PROVIDER_SITE_OTHER): Payer: Medicare HMO | Admitting: Physician Assistant

## 2023-05-02 ENCOUNTER — Emergency Department (HOSPITAL_BASED_OUTPATIENT_CLINIC_OR_DEPARTMENT_OTHER): Payer: Medicare HMO

## 2023-05-02 VITALS — BP 150/78 | HR 93 | Ht 65.0 in | Wt 201.0 lb

## 2023-05-02 DIAGNOSIS — I2693 Single subsegmental pulmonary embolism without acute cor pulmonale: Secondary | ICD-10-CM | POA: Insufficient documentation

## 2023-05-02 DIAGNOSIS — M249 Joint derangement, unspecified: Secondary | ICD-10-CM | POA: Diagnosis not present

## 2023-05-02 DIAGNOSIS — R7989 Other specified abnormal findings of blood chemistry: Secondary | ICD-10-CM

## 2023-05-02 DIAGNOSIS — R5382 Chronic fatigue, unspecified: Secondary | ICD-10-CM | POA: Diagnosis not present

## 2023-05-02 DIAGNOSIS — M797 Fibromyalgia: Secondary | ICD-10-CM

## 2023-05-02 DIAGNOSIS — R0602 Shortness of breath: Secondary | ICD-10-CM | POA: Insufficient documentation

## 2023-05-02 DIAGNOSIS — R030 Elevated blood-pressure reading, without diagnosis of hypertension: Secondary | ICD-10-CM | POA: Diagnosis not present

## 2023-05-02 DIAGNOSIS — Z8616 Personal history of COVID-19: Secondary | ICD-10-CM | POA: Insufficient documentation

## 2023-05-02 DIAGNOSIS — D72829 Elevated white blood cell count, unspecified: Secondary | ICD-10-CM | POA: Diagnosis not present

## 2023-05-02 DIAGNOSIS — R002 Palpitations: Secondary | ICD-10-CM | POA: Diagnosis present

## 2023-05-02 LAB — PREGNANCY, URINE: Preg Test, Ur: NEGATIVE

## 2023-05-02 LAB — TROPONIN I (HIGH SENSITIVITY)
Troponin I (High Sensitivity): 2 ng/L (ref <18)
Troponin I (High Sensitivity): 3 ng/L (ref <18)

## 2023-05-02 LAB — CBC
HCT: 37.9 % (ref 36.0–46.0)
Hemoglobin: 13 g/dL (ref 12.0–15.0)
MCH: 30.7 pg (ref 26.0–34.0)
MCHC: 34.3 g/dL (ref 30.0–36.0)
MCV: 89.6 fL (ref 80.0–100.0)
Platelets: 235 10*3/uL (ref 150–400)
RBC: 4.23 MIL/uL (ref 3.87–5.11)
RDW: 13.2 % (ref 11.5–15.5)
WBC: 12.7 10*3/uL — ABNORMAL HIGH (ref 4.0–10.5)
nRBC: 0 % (ref 0.0–0.2)

## 2023-05-02 LAB — BASIC METABOLIC PANEL
Anion gap: 8 (ref 5–15)
BUN: 24 mg/dL — ABNORMAL HIGH (ref 6–20)
CO2: 24 mmol/L (ref 22–32)
Calcium: 8.6 mg/dL — ABNORMAL LOW (ref 8.9–10.3)
Chloride: 107 mmol/L (ref 98–111)
Creatinine, Ser: 0.78 mg/dL (ref 0.44–1.00)
GFR, Estimated: >60 mL/min (ref >60)
Glucose, Bld: 106 mg/dL — ABNORMAL HIGH (ref 70–99)
Potassium: 3.6 mmol/L (ref 3.5–5.1)
Sodium: 139 mmol/L (ref 135–145)

## 2023-05-02 LAB — RESP PANEL BY RT-PCR (RSV, FLU A&B, COVID)  RVPGX2
Influenza A by PCR: NEGATIVE
Influenza B by PCR: NEGATIVE
Resp Syncytial Virus by PCR: NEGATIVE
SARS Coronavirus 2 by RT PCR: NEGATIVE

## 2023-05-02 LAB — D-DIMER, QUANTITATIVE
D-DIMER: 0.59 mg{FEU}/L — ABNORMAL HIGH (ref 0.00–0.49)
D-Dimer, Quant: 0.42 ug{FEU}/mL (ref 0.00–0.50)

## 2023-05-02 MED ORDER — IOHEXOL 350 MG/ML SOLN
75.0000 mL | Freq: Once | INTRAVENOUS | Status: AC | PRN
  Administered 2023-05-02: 75 mL via INTRAVENOUS

## 2023-05-02 NOTE — Telephone Encounter (Signed)
 Per [employer] - clinical notes and results are required for medical review. Spoke to the patient regarding the requirements. The patient did not want to wait. She has decided to go to the ER to complete the CTA.

## 2023-05-02 NOTE — ED Triage Notes (Signed)
 Pt reports ongoing SHOB and chest congestion since having COVID in December. Pt reports she has been on two rounds of steroids recently without improvement. Pt reports palpitations today. Pt reports having positive d-dimer and PCP and was told to come here. Pt denies chest pain at present.

## 2023-05-02 NOTE — ED Notes (Signed)
 Patient transported to CT

## 2023-05-02 NOTE — Progress Notes (Signed)
 Acute Office Visit  Subjective:     Patient ID: Julie Johnston, female    DOB: 1970/06/17, 53 y.o.   MRN: [id number]  No chief complaint on file.   HPI Patient is in today for to follow up from last week SOB. Pt continued to be SOB and started on prednisone . She feels a little better but not much. She continues to be winded easily. Her labs from last week were all normal. Cardiac enzymes, metabolic panel, TB. CXR without any acute findings.  D-dimer was not checked. She denies any lower leg edema or calf pain. She has had 10lb weight gain over the last month. She is able to lay flat at night.   Her PT mentioned connective tissue disorder and she would like to be checked. She has lots of joint hypermobility, raynauds phenomenon, arthralgias.   .. Active Ambulatory Problems    Diagnosis Date Noted   Lyme disease 05/09/2015   Insomnia 05/09/2015   Palpitations 05/09/2015   Chronic fatigue 05/09/2015   No energy 05/09/2015   Hyperlipidemia 05/09/2015   Pap smear abnormality of cervix with LGSIL 02/07/2014   Attention deficit hyperactivity disorder (ADHD), predominantly inattentive type 05/09/2015   Major depressive disorder    Obstructive sleep apnea 06/18/2015   Fibromyalgia 09/22/2015   Drug-induced weight gain 02/23/2016   Migraine 11/29/2017   Frequent infections 05/08/2018   Decreased thyroid  stimulating hormone (TSH) level 05/08/2018   Equivocal stress echocardiogram 05/17/2018   Essential hypertension 06/04/2019   Post concussion syndrome 10/01/2019   Acute thoracic back pain 10/01/2019   PTSD (post-traumatic stress disorder) 10/01/2019   Generalized anxiety disorder 11/07/2019   Mild traumatic brain injury 09/12/2019   Pulmonary collapse 06/10/2014   Polyp of transverse colon 02/25/2020   Diverticulosis of colon 02/25/2020   Internal hemorrhoids 02/25/2020   Dizziness 05/19/2020   Double vision 05/19/2020   Vertigo 05/19/2020   Severe obesity (BMI >= 40) (HCC)  05/27/2020   Other abnormalities of gait and mobility 06/02/2020   Acute intractable headache 08/01/2020   Post-concussion headache 08/04/2020   Parotiditis 08/14/2020   History of Roux-en-Y gastric bypass 11/18/2020   Paresthesia 11/18/2020   Non-alcoholic fatty liver disease 11/25/2020   Intractable migraine with aura with status migrainosus 12/16/2020   Elevated liver enzymes 12/17/2020   Screen for STD (sexually transmitted disease) 08/19/2021   Cellulitis of right upper extremity 01/19/2022   Cognitive change 10/26/2019   Concussion without loss of consciousness 10/26/2019   Post-operative nausea and vomiting 08/29/2020   TMJ pain dysfunction syndrome 01/09/2021   Adjustment disorder with mixed anxiety and depressed mood 10/26/2019   Intertrigo 06/15/2022   Excess skin of abdominal wall 06/15/2022   GAD (generalized anxiety disorder) 06/15/2022   Chronic bilateral low back pain without sciatica 09/08/2022   DDD (degenerative disc disease), lumbar 09/08/2022   DDD (degenerative disc disease), cervical 09/28/2022   Other osteoporosis without current pathological fracture 11/19/2022   MDD (major depressive disorder), recurrent episode, severe (HCC) 11/19/2022   Epidermal cyst 11/19/2022   COVID-19 virus infection 03/06/2023   History of COVID-19 04/26/2023   SOB (shortness of breath) 04/26/2023   Hot flashes 04/26/2023   Night sweats 04/26/2023   Chest tightness 04/26/2023   Sweating increase 04/27/2023   Hypermobility of joint 05/02/2023   Elevated blood pressure reading 05/02/2023   Resolved Ambulatory Problems    Diagnosis Date Noted   Pre-diabetes 05/14/2015   Urine ketones 09/22/2015   Obesity 09/22/2015   Dysuria 11/29/2017  Influenza B 12/16/2017   Potential exposure to STD 12/16/2017   Influenza-like illness 07/20/2018   Right posterior sixth rib fracture 10/02/2019   Controlled type 2 diabetes mellitus with complication, without long-term current use of  insulin (HCC) 05/27/2020   Unsteadiness on feet 06/02/2020   Head injury 08/01/2020   Parotid swelling 08/13/2020   Ear fullness, right 08/14/2020   Foreign body (FB) in soft tissue 09/19/2020   Current hepatitis A infection 11/25/2020   Adhesive capsulitis of left shoulder 04/18/2017   Closed torus fracture of lower end of left fibula 09/20/2018   Foot sprain, right, initial encounter 08/21/2018   Past Medical History:  Diagnosis Date   Allergy    Arthritis    Obesity (BMI 35.0-39.9 without comorbidity)      ROS See HPI.      Objective:    BP (!) 175/85   Pulse 93   Ht 5\' 5"  (1.651 m)   Wt 201 lb (91.2 kg)   SpO2 99%   BMI 33.45 kg/m  BP Readings from Last 3 Encounters:  05/02/23 (!) 175/85  04/26/23 110/78  04/18/23 123/71   Wt Readings from Last 3 Encounters:  05/02/23 201 lb (91.2 kg)  04/26/23 192 lb (87.1 kg)  04/18/23 193 lb 6.4 oz (87.7 kg)      Physical Exam Constitutional:      Appearance: Normal appearance. She is obese.  HENT:     Head: Normocephalic.  Cardiovascular:     Rate and Rhythm: Normal rate and regular rhythm.  Pulmonary:     Effort: Pulmonary effort is normal.     Breath sounds: Normal breath sounds.  Musculoskeletal:     Right lower leg: No edema.     Left lower leg: No edema.     Comments: No calf tenderness, swelling, redness.   Neurological:     General: No focal deficit present.     Mental Status: She is alert and oriented to person, place, and time.  Psychiatric:        Mood and Affect: Mood normal.          Assessment & Plan:  Aaron AasAaron AasDiagnoses and all orders for this visit:  SOB (shortness of breath) -     D-dimer, quantitative -     B Nat Peptide -     ANA,IFA RA Diag Pnl w/rflx Tit/Patn -     Sed Rate (ESR) -     C-reactive protein -     CBC w/Diff/Platelet  Chronic fatigue -     D-dimer, quantitative -     B Nat Peptide -     ANA,IFA RA Diag Pnl w/rflx Tit/Patn -     Sed Rate (ESR) -     C-reactive  protein -     CBC w/Diff/Platelet  Fibromyalgia -     B Nat Peptide -     ANA,IFA RA Diag Pnl w/rflx Tit/Patn -     Sed Rate (ESR) -     C-reactive protein -     CBC w/Diff/Platelet  Hypermobility of joint -     B Nat Peptide -     ANA,IFA RA Diag Pnl w/rflx Tit/Patn -     Sed Rate (ESR) -     C-reactive protein -     CBC w/Diff/Platelet  Elevated blood pressure reading   Suspect SOB is likely post viral etiology and will take time Continue prednisone  Will check D-dimer today to rule out PE, no signs of DVT  on exam.  If d-dimer positive will order CTA.  Will check BNP for any extra fluid around heart or for signs of heart failure. She has gained 10lbs in last month Will check ANA, ESR, CBC for connective tissue disorder  Sandy Crumb, PA-C

## 2023-05-02 NOTE — Progress Notes (Signed)
 Negative for TB. How are you feeling?

## 2023-05-02 NOTE — Progress Notes (Signed)
 Positive D-dimer. CTA ordered for [facility].

## 2023-05-03 ENCOUNTER — Encounter: Payer: Self-pay | Admitting: Physician Assistant

## 2023-05-03 ENCOUNTER — Ambulatory Visit: Payer: Self-pay | Admitting: Physician Assistant

## 2023-05-03 ENCOUNTER — Ambulatory Visit (INDEPENDENT_AMBULATORY_CARE_PROVIDER_SITE_OTHER): Payer: Medicare HMO | Admitting: Medical-Surgical

## 2023-05-03 ENCOUNTER — Encounter: Payer: Self-pay | Admitting: Medical-Surgical

## 2023-05-03 VITALS — BP 112/68 | HR 81 | Resp 20 | Ht 65.0 in | Wt 199.8 lb

## 2023-05-03 DIAGNOSIS — Z09 Encounter for follow-up examination after completed treatment for conditions other than malignant neoplasm: Secondary | ICD-10-CM | POA: Diagnosis not present

## 2023-05-03 DIAGNOSIS — R0602 Shortness of breath: Secondary | ICD-10-CM | POA: Diagnosis not present

## 2023-05-03 MED ORDER — APIXABAN 2.5 MG PO TABS
10.0000 mg | ORAL_TABLET | Freq: Once | ORAL | Status: AC
  Administered 2023-05-03: 10 mg via ORAL
  Filled 2023-05-03: qty 4

## 2023-05-03 MED ORDER — APIXABAN (ELIQUIS) VTE STARTER PACK (10MG AND 5MG): ORAL_TABLET | ORAL | 0 refills | Status: DC

## 2023-05-03 NOTE — Discharge Instructions (Signed)
Begin taking Eliquis as prescribed.  Follow-up with your primary doctor in the next few days.  Return to the emergency department if symptoms significantly worsen or change.

## 2023-05-03 NOTE — Progress Notes (Unsigned)
        Established patient visit  History, exam, impression, and plan:  1. Hospital discharge follow-up (Primary) Julie Johnston 53 year old female presenting today for hospital discharge follow-up.  She was seen yesterday at the ER after having a positive D-dimer in the setting of shortness of breath.  The stat imaging that was ordered was not approved by insurance so she was directed to the ER for further evaluation.  While there she was found to have a single subsegmental pulmonary embolism without acute cor pulmonale in the right lower lobe.  She was discharged with instructions to start Eliquis 10 mg twice daily.  She has had 1 dose of medication and reports that it is too costly for her to get from the pharmacy.  She presents today with significant concerns for new onset symptoms that started last night/this morning.  She reports that she is very dizzy and unbalanced.  Feeling very fatigued with weakness in her legs.  Also feels more confused than usual.  Admits to being quite anxious about her diagnosis but does not feel that anxiety is the cause of her symptoms.  Seems very focused on the fact that she can no longer take Celebrex due to being on Eliquis.  Repeatedly asking for other options in the NSAID category to help manage her chronic pain.  On exam, she is visibly anxious, running her hands through her hair as a sign of mental distress.  Speech is clear.  Alert and oriented x 3.  Gait is slow and guarded but moving independently without notable weaknes or foot drop.  Unclear if her symptoms are related to a medication reaction from Eliquis or simply related to the anxiety and mental stress of the new diagnosis with restriction of NSAID use.  Coupon card given for Eliquis so that she gets a 30-day supply which will allow [place] to figure out how to get around the financial constraints.  Unfortunately, I was not able to find any alternatives that she is willing to take or tolerate for pain management.  I  advised her that I would certainly touch base with her PCP in the morning and make her aware of the significant concerns regarding pain.  We reviewed monitoring of her current symptoms and if they worsen or new symptoms develop, advised her to go straight to the ED for further evaluation.  No overt signs of stroke or significant cognitive change.  Deferring further imaging today.   Procedures performed this visit: None.  Return if symptoms worsen or fail to improve.  __________________________________ Thayer Ohm, DNP, APRN, FNP-BC Primary Care and Sports Medicine [facility]

## 2023-05-03 NOTE — ED Provider Notes (Signed)
[place] [facility] Provider Note   CSN: [id number] Arrival date & time: 05/02/23  1926     History  Chief Complaint  Patient presents with   Shortness of Breath   Palpitations    Julie Johnston is a 53 y.o. female.  Patient is a 53 year old female with past medical history of fibromyalgia, migraines, depression, PTSD.  Patient presenting today for evaluation of shortness of breath and palpitations.  She has had ongoing issues with dyspnea since being diagnosed with COVID in December.  She has been treated with steroids, but these symptoms persist.  She had a positive D-dimer test today at the doctor's office, then was referred here for further workup.  She denies chest pain.  No fevers or chills.  She does describe some shortness of breath and persistent cough.  The history is provided by the patient.       Home Medications Prior to Admission medications   Medication Sig Start Date End Date Taking? Authorizing Provider  albuterol (VENTOLIN HFA) 108 (90 Base) MCG/ACT inhaler Inhale 1-2 puffs into the lungs every 6 (six) hours as needed for wheezing or shortness of breath. 04/20/23   Breeback, Jade L, PA-C  celecoxib (CELEBREX) 200 MG capsule TAKE 1 TO 2 CAPSULES BY MOUTH DAILY AS NEEDED FOR PAIN. 06/15/22   Breeback, Lonna Cobb, PA-C  Erenumab-aooe (AIMOVIG) 140 MG/ML SOAJ INJECT 140 MG INTO THE SKIN EVERY 30 DAYS 12/20/22   Everlena Cooper, Adam R, DO  Eszopiclone 3 MG TABS Take 3 mg by mouth at bedtime as needed. 03/31/22   [provider]  fluticasone (FLONASE) 50 MCG/ACT nasal spray SPRAY 2 SPRAYS INTO EACH NOSTRIL EVERY DAY 12/03/22   Breeback, Jade L, PA-C  gabapentin (NEURONTIN) 800 MG tablet TAKE 1 TABLET BY MOUTH THREE TIMES A DAY 01/06/21   Breeback, Jade L, PA-C  mupirocin ointment (BACTROBAN) 2 % APPLY TO AFFECTED AREA 3 TIMES A DAY FOR 7 DAYS 10/21/22   Breeback, Jade L, PA-C  nystatin cream (MYCOSTATIN) Apply 1 Application topically 2 (two)  times daily. 06/15/22   Breeback, Lonna Cobb, PA-C  omeprazole (PRILOSEC) 20 MG capsule Take 1 capsule (20 mg total) by mouth daily. 06/15/22   Breeback, Jade L, PA-C  predniSONE (DELTASONE) 20 MG tablet Take 3 tablets for 3 days, take 2 tablets for 3 days, take 1 tablet for 3 days, take 1/2 tablet for 4 days. 04/29/23   Breeback, Jade L, PA-C  rizatriptan (MAXALT-MLT) 10 MG disintegrating tablet TAKE 1 TABLET BY MOUTH AS NEEDED FOR MIGRAINE. MAY REPEAT IN 2 HOURS IF NEEDED 06/08/21   Breeback, Jade L, PA-C  selegiline (EMSAM) 9 MG/24HR Place 12 mg onto the skin daily. Pt takes 12mg     [provider]  tiZANidine (ZANAFLEX) 2 MG tablet TAKE 1 TAB AT BEDTIME FOR 1 WEEK, THEN 1 TAB TWICE DAILY FOR 1 WEEK, THEN 1 TAB 3 TIMES DAILY 04/25/23   Antony Madura, MD  traZODone (DESYREL) 100 MG tablet Take 100 mg by mouth at bedtime as needed. 01/19/21   [provider]  valACYclovir (VALTREX) 1000 MG tablet Take 2 tablets (2,000 mg total) by mouth 2 (two) times daily. For one day. 02/01/18   Jomarie Longs, PA-C      Allergies    Belviq [lorcaserin hcl]    Review of Systems   Review of Systems  All other systems reviewed and are negative.   Physical Exam Updated Vital Signs BP 137/73 (BP Location:  Right Arm)   Pulse 72   Temp 98.1 F (36.7 C) (Oral)   Resp 11   Ht 5\' 5"  (1.651 m)   Wt 91.2 kg   SpO2 96%   BMI 33.45 kg/m  Physical Exam Vitals and nursing note reviewed.  Constitutional:      General: She is not in acute distress.    Appearance: She is well-developed. She is not diaphoretic.  HENT:     Head: Normocephalic and atraumatic.  Cardiovascular:     Rate and Rhythm: Normal rate and regular rhythm.     Heart sounds: No murmur heard.    No friction rub. No gallop.  Pulmonary:     Effort: Pulmonary effort is normal. No respiratory distress.     Breath sounds: Normal breath sounds. No wheezing.  Abdominal:     General: Bowel sounds are normal. There is no distension.      Palpations: Abdomen is soft.     Tenderness: There is no abdominal tenderness.  Musculoskeletal:        General: Normal range of motion.     Cervical back: Normal range of motion and neck supple.     Right lower leg: No tenderness. No edema.     Left lower leg: No tenderness. No edema.  Skin:    General: Skin is warm and dry.  Neurological:     General: No focal deficit present.     Mental Status: She is alert and oriented to person, place, and time.     ED Results / Procedures / Treatments   Labs (all labs ordered are listed, but only abnormal results are displayed) Labs Reviewed  BASIC METABOLIC PANEL - Abnormal; Notable for the following components:      Result Value   Glucose, Bld 106 (*)    BUN 24 (*)    Calcium 8.6 (*)    All other components within normal limits  CBC - Abnormal; Notable for the following components:   WBC 12.7 (*)    All other components within normal limits  RESP PANEL BY RT-PCR (RSV, FLU A&B, COVID)  RVPGX2  PREGNANCY, URINE  D-DIMER, QUANTITATIVE  TROPONIN I (HIGH SENSITIVITY)  TROPONIN I (HIGH SENSITIVITY)    EKG EKG Interpretation Date/Time:  Monday May 02 2023 19:35:48 EST Ventricular Rate:  82 PR Interval:  166 QRS Duration:  90 QT Interval:  356 QTC Calculation: 416 R Axis:   41  Text Interpretation: Sinus rhythm RSR' in V1 or V2, right VCD or RVH Baseline wander in lead(s) V6 Confirmed by Geoffery Lyons ([id number]) on 05/02/2023 11:19:17 PM  Radiology CT Angio Chest PE W and/or Wo Contrast Result Date: 05/03/2023 CLINICAL DATA:  Shortness of breath EXAM: CT ANGIOGRAPHY CHEST WITH CONTRAST TECHNIQUE: Multidetector CT imaging of the chest was performed using the standard protocol during bolus administration of intravenous contrast. Multiplanar CT image reconstructions and MIPs were obtained to evaluate the vascular anatomy. RADIATION DOSE REDUCTION: This exam was performed according to the departmental dose-optimization program which  includes automated exposure control, adjustment of the mA and/or kV according to patient size and/or use of iterative reconstruction technique. CONTRAST:  75mL OMNIPAQUE IOHEXOL 350 MG/ML SOLN COMPARISON:  Chest x-ray 04/26/2023, CT chest 10/02/2019 FINDINGS: Cardiovascular: Satisfactory opacification of the pulmonary arteries to the segmental level. Small nonocclusive filling defect within subsegmental right lower lobe pulmonary vessel, series 4, image 137. No other discrete filling defects are visualized. Normal cardiac size. No pericardial effusion. Nonaneurysmal aorta Mediastinum/Nodes: No enlarged mediastinal,  hilar, or axillary lymph nodes. Thyroid gland, trachea, and esophagus demonstrate no significant findings. Lungs/Pleura: Lungs are clear. No pleural effusion or pneumothorax. Upper Abdomen: Status post gastric bypass.  No acute finding Musculoskeletal: No chest wall abnormality. No acute or significant osseous findings. Review of the MIP images confirms the above findings. IMPRESSION: 1. Probable small subsegmental right lower lobe pulmonary embolus. 2. No other acute intrathoracic abnormality. Critical Value/emergent results were called by telephone at the time of interpretation on 05/03/2023 at 12:40 am to provider Geoffery Lyons , who verbally acknowledged these results. Electronically Signed   By: Jasmine Pang M.D.   On: 05/03/2023 00:41    Procedures Procedures    Medications Ordered in ED Medications  iohexol (OMNIPAQUE) 350 MG/ML injection 75 mL (75 mLs Intravenous Contrast Given 05/02/23 2356)    ED Course/ Medical Decision Making/ A&P  Patient presenting with shortness of breath and palpitations as described in the HPI.  Patient arrives here with stable vital signs and is afebrile.  Physical examination basically unremarkable.  There is no hypoxia.  Laboratory studies obtained including CBC, metabolic panel, and D-dimer.  She has a leukocytosis with white count of 12.7, but  laboratory studies otherwise unremarkable.  Troponin is negative.  D-dimer is also negative at 0.14.  CT angiogram of the chest obtained showing a probable small subsegmental pulmonary embolism, but no other acute findings.  Anticoagulation will be initiated and I will have patient follow-up with primary doctor in the next few days.  Final Clinical Impression(s) / ED Diagnoses Final diagnoses:  None    Rx / DC Orders ED Discharge Orders     None         Geoffery Lyons, MD 05/03/23 [phone]

## 2023-05-03 NOTE — Telephone Encounter (Addendum)
Chief Complaint: heaviness/lightheaded Symptoms: lightheadedness, SOB (at baseline) Frequency: yesterday Pertinent Negatives: Patient denies fever, chest pain, numbness/tingling, double vision Disposition: [] ED /[] Urgent Care (no appt availability in office) / [x] Appointment(In office/virtual)/ []  [place] [facility]/ [] Home Care/ [] Refused Recommended Disposition /[] [facility]/ []  Follow-up with PCP Additional Notes: Patient with c/o of heaviness, lightheadedness since yesterday. Of note, pt went to ED yesterday and was dx with PE and started on Eliquis. Pt reports that lightheadedness was present since yesterday and has worsened today along with a sensation of "heaviness" and feeling "discombobulated". Pt endorses SOB that is unchanged since yesterday. Pt does endorse that she is anxious about her medical situation, and inability to afford Eliquis and interaction with Celebrex. Of note, during triage call, PCP's RN called pt and stated that pt would be eligible for a 30-day supply for the time being. Scheduled patient per protocol on 05/03/2023 for acute concern and ED F/U. Patient verbalized understanding and to call 911 with worsening symptoms. Pt also noted that she will take a Uber to appt to ensure safety.    Copied from CRM [id number]. Topic: Clinical - Red Word Triage >> May 03, 2023  1:51 PM Nila Nephew wrote: Red Word that prompted transfer to Nurse Triage: Post ER follow-up, states she needs to speak to PCP or clinical lead Reason for Disposition  Taking a medicine that could cause dizziness (e.g., blood pressure medications, diuretics)  Answer Assessment - Initial Assessment Questions 1. NAME of MEDICINE: "What medicine(s) are you calling about?"     elaquis 2. QUESTION: "What is your question?" (e.g., double dose of medicine, side effect)     "I feel really weird" "I feel really heavy like I'm walking thru sand" Rx payment is high and unable Has concern with stopping  Celebrex-- is another alternative to Eliquis? 3. PRESCRIBER: "Who prescribed the medicine?" Reason: if prescribed by specialist, call should be referred to that group.     ED MD 4. SYMPTOMS: "Do you have any symptoms?" If Yes, ask: "What symptoms are you having?"  "How bad are the symptoms (e.g., mild, moderate, severe)     Light-headedness/confusion - reports TBI 3 years ago, "this is googly/discombobulated" Denies chest pain SOB is at baseline from ED visit  Answer Assessment - Initial Assessment Questions 2. LIGHTHEADED: "Do you feel lightheaded?" (e.g., somewhat faint, woozy, weak upon standing)     Started while in ED, but now worsened 3. VERTIGO: "Do you feel like either you or the room is spinning or tilting?" (i.e. vertigo)     no 4. SEVERITY: "How bad is it?"  "Do you feel like you are going to faint?" "Can you stand and walk?"   - MILD: Feels slightly dizzy, but walking normally.   - MODERATE: Feels unsteady when walking, but not falling; interferes with normal activities (e.g., school, work).   - SEVERE: Unable to walk without falling, or requires assistance to walk without falling; feels like passing out now.      mild 5. ONSET:  "When did the dizziness begin?"     yesterday 6. AGGRAVATING FACTORS: "Does anything make it worse?" (e.g., standing, change in head position)     Standing up too fast  8. CAUSE: "What do you think is causing the dizziness?"     Eliquis 9. RECURRENT SYMPTOM: "Have you had dizziness before?" If Yes, ask: "When was the last time?" "What happened that time?"     no 10. OTHER SYMPTOMS: "Do you have any other  symptoms?" (e.g., fever, chest pain, vomiting, diarrhea, bleeding)       Ongoing cough that hasn't changed Has felt generally sick for weeks  Protocols used: Medication Question Call-A-AH, Dizziness - Lightheadedness-A-AH

## 2023-05-03 NOTE — Telephone Encounter (Unsigned)
Copied from CRM [id number]. Topic: Clinical - Prescription Issue >> May 03, 2023 11:00 AM Julie Johnston wrote: Reason for CRM: Patient was prescribed APIXABAN (ELIQUIS) VTE STARTER PACK (10MG  AND 5MG ) at the ED last night. Patient states this medication is entirely too expensive and is inquiring if there is a card we may have to reduce the cost of the prescription or if an alternative can be prescribed.   Patient also takes Celebrex. Patient states there is an interaction between Celebrex and Eliquis. Patient would like to be advised if she can continue taking Celebrex despite the interaction or if she should cease taking it entirely.

## 2023-05-04 ENCOUNTER — Telehealth: Payer: Self-pay

## 2023-05-04 DIAGNOSIS — F339 Major depressive disorder, recurrent, unspecified: Secondary | ICD-10-CM | POA: Diagnosis not present

## 2023-05-04 DIAGNOSIS — F909 Attention-deficit hyperactivity disorder, unspecified type: Secondary | ICD-10-CM | POA: Diagnosis not present

## 2023-05-04 NOTE — Transitions of Care (Post Inpatient/ED Visit) (Signed)
   05/04/2023  Name: Julie Johnston MRN: [id number] DOB: 05-23-1970  Today's TOC FU Call Status: Today's TOC FU Call Status:: Successful TOC FU Call Completed TOC FU Call Complete Date: 05/04/23 Patient's Name and Date of Birth confirmed.  Transition Care Management Follow-up Telephone Call Date of Discharge: 05/03/23 Discharge Facility: [facility] Type of Discharge: Emergency Department Reason for ED Visit: Other: How have you been since you were released from the hospital?: Same Any questions or concerns?: No  Items Reviewed: Did you receive and understand the discharge instructions provided?: Yes Medications obtained,verified, and reconciled?: Yes (Medications Reviewed) Any new allergies since your discharge?: No Dietary orders reviewed?: Yes Do you have support at home?: No  Medications Reviewed Today: Medications Reviewed Today   Medications were not reviewed in this encounter     Home Care and Equipment/Supplies: Were Home Health Services Ordered?: NA Any new equipment or medical supplies ordered?: NA  Functional Questionnaire: Do you need assistance with bathing/showering or dressing?: No Do you need assistance with meal preparation?: No Do you need assistance with eating?: No Do you have difficulty maintaining continence: No Do you need assistance with getting out of bed/getting out of a chair/moving?: No Do you have difficulty managing or taking your medications?: No  Follow up appointments reviewed: PCP Follow-up appointment confirmed?: Yes Date of PCP follow-up appointment?: 05/03/23 Specialist Hospital Follow-up appointment confirmed?: NA Do you need transportation to your follow-up appointment?: No Do you understand care options if your condition(s) worsen?: Yes-patient verbalized understanding  patient seen yesterday 05/03/2023  SIGNATURE Karena Addison, LPN [facility] Nurse Health Advisor Direct Dial [phone]

## 2023-05-05 ENCOUNTER — Ambulatory Visit: Payer: Medicare HMO | Admitting: Family Medicine

## 2023-05-05 ENCOUNTER — Ambulatory Visit: Payer: Self-pay | Admitting: Physician Assistant

## 2023-05-05 ENCOUNTER — Encounter: Payer: Self-pay | Admitting: Medical-Surgical

## 2023-05-05 ENCOUNTER — Ambulatory Visit
Admission: EM | Admit: 2023-05-05 | Discharge: 2023-05-05 | Disposition: A | Discharge status: Home / Self Care | Payer: Medicare HMO | Attending: Family Medicine | Admitting: Family Medicine

## 2023-05-05 DIAGNOSIS — I2699 Other pulmonary embolism without acute cor pulmonale: Secondary | ICD-10-CM | POA: Diagnosis not present

## 2023-05-05 DIAGNOSIS — X58XXXA Exposure to other specified factors, initial encounter: Secondary | ICD-10-CM | POA: Diagnosis not present

## 2023-05-05 DIAGNOSIS — R4182 Altered mental status, unspecified: Secondary | ICD-10-CM

## 2023-05-05 DIAGNOSIS — M7989 Other specified soft tissue disorders: Secondary | ICD-10-CM | POA: Diagnosis not present

## 2023-05-05 DIAGNOSIS — E119 Type 2 diabetes mellitus without complications: Secondary | ICD-10-CM | POA: Diagnosis not present

## 2023-05-05 DIAGNOSIS — S069XAA Unspecified intracranial injury with loss of consciousness status unknown, initial encounter: Secondary | ICD-10-CM | POA: Diagnosis not present

## 2023-05-05 DIAGNOSIS — F909 Attention-deficit hyperactivity disorder, unspecified type: Secondary | ICD-10-CM | POA: Diagnosis not present

## 2023-05-05 DIAGNOSIS — G4733 Obstructive sleep apnea (adult) (pediatric): Secondary | ICD-10-CM | POA: Diagnosis not present

## 2023-05-05 DIAGNOSIS — I10 Essential (primary) hypertension: Secondary | ICD-10-CM | POA: Diagnosis not present

## 2023-05-05 DIAGNOSIS — R519 Headache, unspecified: Secondary | ICD-10-CM | POA: Diagnosis not present

## 2023-05-05 DIAGNOSIS — Z79899 Other long term (current) drug therapy: Secondary | ICD-10-CM | POA: Diagnosis not present

## 2023-05-05 DIAGNOSIS — Z87891 Personal history of nicotine dependence: Secondary | ICD-10-CM | POA: Diagnosis not present

## 2023-05-05 DIAGNOSIS — R410 Disorientation, unspecified: Secondary | ICD-10-CM | POA: Diagnosis not present

## 2023-05-05 DIAGNOSIS — M797 Fibromyalgia: Secondary | ICD-10-CM | POA: Diagnosis not present

## 2023-05-05 DIAGNOSIS — F418 Other specified anxiety disorders: Secondary | ICD-10-CM | POA: Diagnosis not present

## 2023-05-05 DIAGNOSIS — Z9049 Acquired absence of other specified parts of digestive tract: Secondary | ICD-10-CM | POA: Diagnosis not present

## 2023-05-05 DIAGNOSIS — Z5329 Procedure and treatment not carried out because of patient's decision for other reasons: Secondary | ICD-10-CM | POA: Diagnosis not present

## 2023-05-05 DIAGNOSIS — G459 Transient cerebral ischemic attack, unspecified: Secondary | ICD-10-CM | POA: Diagnosis not present

## 2023-05-05 DIAGNOSIS — M1990 Unspecified osteoarthritis, unspecified site: Secondary | ICD-10-CM | POA: Diagnosis not present

## 2023-05-05 NOTE — ED Notes (Signed)
Patient is being discharged from the Urgent Care and sent to the Emergency Department via pov . Per dr. Marlinda Mike MD, patient is in need of higher level of care due to confusion and recent PE. Patient is aware and verbalizes understanding of plan of care.  Vitals:   05/05/23 1605  BP: (!) 175/92  Pulse: 85  Resp: 16  Temp: 98.3 F (36.8 C)  SpO2: 98%

## 2023-05-05 NOTE — Discharge Instructions (Signed)
She will go to the ER by private vehicle

## 2023-05-05 NOTE — Telephone Encounter (Signed)
  Chief Complaint: facial swelling Symptoms: swelling Frequency: Was in ER the other day and diagnosised with a PE and placed on Eliquis. Pertinent Negatives: Patient denies sob, difficulty breathing, pain, numbness and tingling Disposition: [] ED /[] Urgent Care (no appt availability in office) / [x] Appointment(In office/virtual)/ []  [facility]/ [] Home Care/ [] Refused Recommended Disposition /[] [facility]/ []  Follow-up with PCP Additional Notes: Patient would like to know if she should take the dose of Eliquis today or not.  Office updated and to call patient back.  Apt this afternoon.  Care advice given, denies questions, instructed to go to the ER if becomes worse.   Reason for Disposition  [1] Prescription not at pharmacy AND [2] was prescribed by PCP recently (Exception: [facility] has access to EMR and prescription is recorded there. Go to Home Care and confirm for pharmacy.)  Answer Assessment - Initial Assessment Questions 1. NAME of MEDICINE: "What medicine(s) are you calling about?"     eliquis 2. QUESTION: "What is your question?" (e.g., double dose of medicine, side effect)     Swelling face more on left than right 3. PRESCRIBER: "Who prescribed the medicine?" Reason: if prescribed by specialist, call should be referred to that group.     [place] [facility] [place] 4. SYMPTOMS: "Do you have any symptoms?" If Yes, ask: "What symptoms are you having?"  "How bad are the symptoms (e.g., mild, moderate, severe)     swelling 5. PREGNANCY:  "Is there any chance that you are pregnant?" "When was your last menstrual period?"     na  Protocols used: Medication Question Call-A-AH

## 2023-05-05 NOTE — ED Triage Notes (Signed)
Pt presents to uc with co of confusion. Pt reports she was recently diagnosed with a PE and given eliquis and has been taking that. Pt also reports facial swelling last night and she took benadryl and prednisone for that.  Pmh of TBI
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
## 2023-05-06 ENCOUNTER — Other Ambulatory Visit: Payer: Self-pay | Admitting: Physician Assistant

## 2023-05-06 ENCOUNTER — Encounter: Payer: Self-pay | Admitting: Physician Assistant

## 2023-05-06 ENCOUNTER — Ambulatory Visit: Payer: Self-pay | Admitting: Physician Assistant

## 2023-05-06 DIAGNOSIS — I2699 Other pulmonary embolism without acute cor pulmonale: Secondary | ICD-10-CM | POA: Diagnosis not present

## 2023-05-06 DIAGNOSIS — Z5329 Procedure and treatment not carried out because of patient's decision for other reasons: Secondary | ICD-10-CM | POA: Diagnosis not present

## 2023-05-06 DIAGNOSIS — G459 Transient cerebral ischemic attack, unspecified: Secondary | ICD-10-CM | POA: Diagnosis not present

## 2023-05-06 DIAGNOSIS — Z7901 Long term (current) use of anticoagulants: Secondary | ICD-10-CM

## 2023-05-06 DIAGNOSIS — Z79899 Other long term (current) drug therapy: Secondary | ICD-10-CM

## 2023-05-06 DIAGNOSIS — I2693 Single subsegmental pulmonary embolism without acute cor pulmonale: Secondary | ICD-10-CM

## 2023-05-06 MED ORDER — SULFAMETHOXAZOLE-TRIMETHOPRIM 800-160 MG PO TABS: 1.0000 | ORAL_TABLET | Freq: Two times a day (BID) | ORAL | 0 refills | Status: DC

## 2023-05-06 NOTE — Telephone Encounter (Signed)
Reports declining admission for UTI and would like antibiotics called in.  Office to follow up with her.

## 2023-05-08 LAB — CBC WITH DIFFERENTIAL/PLATELET
Basophils Absolute: 0 10*3/uL (ref 0.0–0.2)
Basos: 0 %
EOS (ABSOLUTE): 0 10*3/uL (ref 0.0–0.4)
Eos: 0 %
Hematocrit: 41.4 % (ref 34.0–46.6)
Hemoglobin: 13.6 g/dL (ref 11.1–15.9)
Immature Grans (Abs): 0.1 10*3/uL (ref 0.0–0.1)
Immature Granulocytes: 1 %
Lymphocytes Absolute: 1 10*3/uL (ref 0.7–3.1)
Lymphs: 8 %
MCH: 30.7 pg (ref 26.6–33.0)
MCHC: 32.9 g/dL (ref 31.5–35.7)
MCV: 94 fL (ref 79–97)
Monocytes Absolute: 0.1 10*3/uL (ref 0.1–0.9)
Monocytes: 1 %
Neutrophils Absolute: 10.5 10*3/uL — ABNORMAL HIGH (ref 1.4–7.0)
Neutrophils: 90 %
Platelets: 250 10*3/uL (ref 150–450)
RBC: 4.43 x10E6/uL (ref 3.77–5.28)
RDW: 12.7 % (ref 11.7–15.4)
WBC: 11.7 10*3/uL — ABNORMAL HIGH (ref 3.4–10.8)

## 2023-05-08 LAB — SEDIMENTATION RATE: Sed Rate: 7 mm/h (ref 0–40)

## 2023-05-08 LAB — ANA,IFA RA DIAG PNL W/RFLX TIT/PATN
ANA Titer 1: NEGATIVE
Cyclic Citrullin Peptide Ab: 4 U (ref 0–19)
Rheumatoid fact SerPl-aCnc: <10 [IU]/mL (ref <14.0)

## 2023-05-08 LAB — BRAIN NATRIURETIC PEPTIDE: BNP: 34.3 pg/mL (ref 0.0–100.0)

## 2023-05-08 LAB — C-REACTIVE PROTEIN: CRP: 1 mg/L (ref 0–10)

## 2023-05-09 DIAGNOSIS — Z136 Encounter for screening for cardiovascular disorders: Secondary | ICD-10-CM | POA: Diagnosis not present

## 2023-05-09 NOTE — Progress Notes (Signed)
 ANA and inflammatory markers negative for any concern for auto-immune disease.

## 2023-05-12 ENCOUNTER — Encounter: Payer: Self-pay | Admitting: Physician Assistant

## 2023-05-12 DIAGNOSIS — M542 Cervicalgia: Secondary | ICD-10-CM | POA: Diagnosis not present

## 2023-05-12 DIAGNOSIS — M47816 Spondylosis without myelopathy or radiculopathy, lumbar region: Secondary | ICD-10-CM | POA: Diagnosis not present

## 2023-05-12 DIAGNOSIS — M25532 Pain in left wrist: Secondary | ICD-10-CM | POA: Diagnosis not present

## 2023-05-12 DIAGNOSIS — S3992XA Unspecified injury of lower back, initial encounter: Secondary | ICD-10-CM | POA: Diagnosis not present

## 2023-05-12 DIAGNOSIS — S63502A Unspecified sprain of left wrist, initial encounter: Secondary | ICD-10-CM | POA: Diagnosis not present

## 2023-05-12 DIAGNOSIS — M47812 Spondylosis without myelopathy or radiculopathy, cervical region: Secondary | ICD-10-CM | POA: Diagnosis not present

## 2023-05-12 DIAGNOSIS — W19XXXA Unspecified fall, initial encounter: Secondary | ICD-10-CM | POA: Diagnosis not present

## 2023-05-12 DIAGNOSIS — R296 Repeated falls: Secondary | ICD-10-CM | POA: Diagnosis not present

## 2023-05-12 DIAGNOSIS — Y92009 Unspecified place in unspecified non-institutional (private) residence as the place of occurrence of the external cause: Secondary | ICD-10-CM | POA: Diagnosis not present

## 2023-05-12 DIAGNOSIS — M7918 Myalgia, other site: Secondary | ICD-10-CM | POA: Diagnosis not present

## 2023-05-12 DIAGNOSIS — M545 Low back pain, unspecified: Secondary | ICD-10-CM | POA: Diagnosis not present

## 2023-05-13 ENCOUNTER — Encounter: Payer: Self-pay | Admitting: Physician Assistant

## 2023-05-13 ENCOUNTER — Telehealth: Payer: Self-pay

## 2023-05-13 ENCOUNTER — Other Ambulatory Visit: Payer: Self-pay | Admitting: Physician Assistant

## 2023-05-13 DIAGNOSIS — R519 Headache, unspecified: Secondary | ICD-10-CM

## 2023-05-13 DIAGNOSIS — Z79899 Other long term (current) drug therapy: Secondary | ICD-10-CM

## 2023-05-13 DIAGNOSIS — I2699 Other pulmonary embolism without acute cor pulmonale: Secondary | ICD-10-CM

## 2023-05-13 DIAGNOSIS — R399 Unspecified symptoms and signs involving the genitourinary system: Secondary | ICD-10-CM

## 2023-05-13 NOTE — Telephone Encounter (Signed)
 Copied from CRM [id number]. Topic: Appointments - Scheduling Inquiry for Clinic >> May 12, 2023  4:02 PM Gery Pray wrote: Reason for CRM: Patient calling to get an MRI due to having a fall last night 02/19. Patient stated that she has been making some really questionable decisions such as climbing on top of an office chair with wheels and spins, something she would never do. Patient has even stated that she struggles with her impulse decisions and choices. Patient is in a lot of pain due to a sprang wrist and some damage that was caused during the fall. Patient states her pain today is a 7 on a scal from 1-10 with 10 being the worse.  Patient is wanting to get a pre auth sent over to have MRI completed. She is wanting to ensure that she does not have a brain bleed.

## 2023-05-16 ENCOUNTER — Telehealth: Payer: Self-pay

## 2023-05-16 NOTE — Progress Notes (Signed)
 05/16/2023 Name: Julie Johnston MRN: [id number] DOB: 04-15-1970  Chief Complaint  Patient presents with   Medication Assistance   Julie Johnston is a 53 y.o. year old female who presented for a telephone visit.   They were referred to the pharmacist by their PCP for assistance in managing medication access.   Subjective:  Care Team: Primary Care Provider: Jomarie Longs, PA-C   Medication Access/Adherence  Current Pharmacy:  [id number] [place] Evangel[place], [place] - [address] [place] [address] Phone: [phone] Fax: [phone]  [facility] [id number] - [place], [place] - [address] [place] [address] Phone: [phone] Fax: [phone]  [facility] - [place], [place] - [address], [address], [address] [place] [address] Phone: [phone] Fax: [phone]  [facility] - [place], [place] - [address] [place] [address] Phone: [phone] Fax: [phone]  -Patient reports affordability concerns with their medications: Yes -patient recently prescribed Eliquis starter pack for subsegmented PE and could not afford copay.  She was provided with a manufacturer coupon to get this first fill at no cost and has approximately 2 weeks of medication remaining. -Patient reports access/transportation concerns to their pharmacy: No  -Patient reports adherence concerns with their medications:  Yes  -unsure if she will be able to afford Eliquis copay moving forward  Objective:  Lab Results  Component Value Date   CREATININE 0.78 05/02/2023   BUN 24 (H) 05/02/2023   NA 139 05/02/2023   K 3.6 05/02/2023   CL 107 05/02/2023   CO2 24 05/02/2023   Medications Reviewed Today     Reviewed by Lenna Gilford, RPH (Pharmacist) on 05/16/23 at 1016  Med List Status: <None>   Medication Order Taking? Sig Documenting Provider Last Dose Status Informant   albuterol (VENTOLIN HFA) 108 (90 Base) MCG/ACT inhaler [id number]  Inhale 1-2 puffs into the lungs every 6 (six) hours as needed for wheezing or shortness of breath. Johnston, Julie L, PA-C  Active   APIXABAN (ELIQUIS) VTE STARTER PACK (10MG  AND 5MG ) [id number] Yes Take as directed on package: start with two-5mg  tablets twice daily for 7 days. On day 8, switch to one-5mg  tablet twice daily. Geoffery Lyons, MD Taking Active   celecoxib (CELEBREX) 200 MG capsule [id number]  TAKE 1 TO 2 CAPSULES BY MOUTH DAILY AS NEEDED FOR PAIN. Jomarie Longs, PA-C  Active   Erenumab-aooe (AIMOVIG) 140 MG/ML Ivory Broad [id number] Yes INJECT 140 MG INTO THE SKIN EVERY 30 DAYS Drema Dallas, DO Taking Active            Med Note Littie Deeds, Kaye Luoma A   Mon May 16, 2023 10:15 AM) PAP  Eszopiclone 3 MG TABS [id number]  Take 3 mg by mouth at bedtime as needed. [provider]  Active   fluticasone (FLONASE) 50 MCG/ACT nasal spray [id number]  SPRAY 2 SPRAYS INTO EACH NOSTRIL EVERY DAY Jomarie Longs, PA-C  Active   gabapentin (NEURONTIN) 800 MG tablet [id number]  TAKE 1 TABLET BY MOUTH THREE TIMES A DAY Johnston, Julie L, PA-C  Active   mupirocin ointment (BACTROBAN) 2 % [id number]  APPLY TO AFFECTED AREA 3 TIMES A DAY FOR 7 DAYS Johnston, Julie L, PA-C  Active   nystatin cream (MYCOSTATIN) [id number]  Apply 1 Application topically 2 (two) times daily. Jomarie Longs, PA-C  Active  Med Note Julie Johnston   Wed Oct 06, 2022  2:30 PM) As needed  omeprazole (PRILOSEC) 20 MG capsule [id number]  Take 1 capsule (20 mg total) by mouth daily. Johnston, Julie L, PA-C  Active   predniSONE (DELTASONE) 20 MG tablet [id number]  Take 3 tablets for 3 days, take 2 tablets for 3 days, take 1 tablet for 3 days, take 1/2 tablet for 4 days. Johnston, Julie L, PA-C  Active   rizatriptan (MAXALT-MLT) 10 MG disintegrating tablet [id number]  TAKE 1 TABLET BY MOUTH AS NEEDED FOR MIGRAINE. MAY REPEAT IN 2 HOURS IF NEEDED Johnston, Julie L, PA-C   Active   selegiline (EMSAM) 9 MG/24HR [id number]  Place 12 mg onto the skin daily. Pt takes 12mg  [provider]  Active            Med Note Littie Deeds, Aristotelis Vilardi A   Mon May 16, 2023 10:16 AM) PAP  sulfamethoxazole-trimethoprim (BACTRIM DS) 800-160 MG tablet [id number]  Take 1 tablet by mouth 2 (two) times daily. Johnston, Julie L, PA-C  Active   tiZANidine (ZANAFLEX) 2 MG tablet [id number]  TAKE 1 TAB AT BEDTIME FOR 1 WEEK, THEN 1 TAB TWICE DAILY FOR 1 WEEK, THEN 1 TAB 3 TIMES DAILY Hill, Manus Gunning, MD  Active   traZODone (DESYREL) 100 MG tablet [id number]  Take 100 mg by mouth at bedtime as needed. [provider]  Active   valACYclovir (VALTREX) 1000 MG tablet [id number]  Take 2 tablets (2,000 mg total) by mouth 2 (two) times daily. For one day. Jomarie Longs, PA-C  Active            Med Note Johnston Needle, Julie J   Tue Apr 06, 2022 11:02 AM) As needed           Assessment/Plan:   Medication Access/Adherence -Patient does not qualify for LIS through Medicare Extra Help based on [facility] -Patient may qualify for Eliquis PAP through [facility] basd on HHI, but she would need to first pay 3% of HHI out of pocket on medication costs to qualify -Contacted patients insurance and a 30 day supply of Eliquis 5mg  BID would be $188/month until her $2000 max out of pocket has been met- patient does have the option to contact insurance and request $2000 be spread across monthly payments to make copay $0 -Dabigitra150mg  BID would be $59/month  Follow Up Plan: Consulting PCP and will follow-up with patient  Lenna Gilford, PharmD, DPLA

## 2023-05-17 NOTE — Progress Notes (Signed)
   05/17/2023  Patient ID: Julie Johnston, female   DOB: [birth year], 53 y.o.   MRN: [id number]  Provider is open to anticoagulation with agent that will be affordable for her and states she will need an additional 2 months of treatment.  Contacting patient via MyChart message to inform of copays for Eliquis and dabigatran to see if either would be affordable for the next two weeks.  I am also verifying how patient is taking trazodone as this medication is contraindicated with Emsam and also should be carefully monitored with anticoagulants due increased risk of bleeding.    Lenna Gilford, PharmD, DPLA

## 2023-05-18 ENCOUNTER — Telehealth: Payer: Self-pay

## 2023-05-18 MED ORDER — DABIGATRAN ETEXILATE MESYLATE 150 MG PO CAPS
150.0000 mg | ORAL_CAPSULE | Freq: Two times a day (BID) | ORAL | 1 refills | Status: DC
Start: 2023-05-18 — End: 2023-07-18

## 2023-05-18 NOTE — Progress Notes (Signed)
   05/18/2023  Patient ID: Julie Johnston, female   DOB: 1971-01-14, 53 y.o.   MRN: [id number]  Patient prefers to switch to Pradaxa 150mg  BID after completing Eliquis starter pack based on affordability.  Prescription is pending for PCP to sign if in agreement.  Note:  patient endorses taking trazodone 200mg  at bedtime for sleep.  This and Emsam (MAOI medication) are both managed by psychiatry.  Informed patient that these two medications should not be taken together due to risk of DDI.  Also informed here there is an increased risk of bleeding when taking trazodone with any anticoagulant.  Recommended decreasing dose by half and only taking as needed at least while on anticoagulation therapy.  I will schedule a follow-up visit to discuss potential adverse effects and s/sx to look out for in regard to serotonin syndrome and bleeding risks.  Lenna Gilford, PharmD, DPLA

## 2023-05-19 ENCOUNTER — Telehealth: Payer: Self-pay | Admitting: Physician Assistant

## 2023-05-19 NOTE — Telephone Encounter (Signed)
 Patient advised. Labs have been ordered.

## 2023-05-19 NOTE — Addendum Note (Signed)
 Addended by: Chalmers Cater on: 05/19/2023 11:52 AM   Modules accepted: Orders

## 2023-05-19 NOTE — Telephone Encounter (Signed)
 Copied from CRM [id number]. Topic: General - Call Back - No Documentation >> May 18, 2023  4:23 PM Geroge Baseman wrote: Reason for CRM: Patient trying to return call to [facility], please call back ASAP.

## 2023-05-20 ENCOUNTER — Other Ambulatory Visit: Payer: Self-pay

## 2023-05-20 ENCOUNTER — Other Ambulatory Visit: Payer: Self-pay | Admitting: Physician Assistant

## 2023-05-20 MED ORDER — DIAZEPAM 5 MG PO TABS: ORAL_TABLET | ORAL | 0 refills | Status: DC

## 2023-05-20 NOTE — Progress Notes (Signed)
   05/20/2023  Patient ID: Julie Johnston, female   DOB: November 10, 1970, 53 y.o.   MRN: [id number]  Patient out reach to further discuss potential drug-drug interactions between trazodone and Emsam and trazodone and anticoagulant medications  -Educated patient on signs and symptoms of serotonin syndrome and informed her to seek medical attention immediately if these present -Patient has backed trazodone down to 200 mg at bedtime from 100 mg at bedtime and endorses sleep has not changed -Educated patient on signs and symptoms of bleeding based on increased risk when taking trazodone with anticoagulant medications.  Instructed her to reach out to PCP if any of these signs present. -Patient understands the risk and potential side effects from taking these medications together  Lenna Gilford, PharmD, DPLA

## 2023-05-23 ENCOUNTER — Ambulatory Visit: Payer: Medicare HMO

## 2023-05-23 DIAGNOSIS — R519 Headache, unspecified: Secondary | ICD-10-CM

## 2023-05-23 DIAGNOSIS — J329 Chronic sinusitis, unspecified: Secondary | ICD-10-CM

## 2023-05-23 DIAGNOSIS — H532 Diplopia: Secondary | ICD-10-CM | POA: Diagnosis not present

## 2023-05-23 DIAGNOSIS — R4182 Altered mental status, unspecified: Secondary | ICD-10-CM | POA: Diagnosis not present

## 2023-05-23 MED ORDER — GADOBUTROL 1 MMOL/ML IV SOLN
9.0000 mL | Freq: Once | INTRAVENOUS | Status: AC | PRN
  Administered 2023-05-23: 9 mL via INTRAVENOUS

## 2023-05-24 NOTE — Telephone Encounter (Signed)
 Patient advised. Labs have been ordered.         Electronically signed by Chalmers Cater, CMA at 05/19/2023 11:52 AM

## 2023-05-26 DIAGNOSIS — R399 Unspecified symptoms and signs involving the genitourinary system: Secondary | ICD-10-CM | POA: Diagnosis not present

## 2023-05-28 LAB — URINE CULTURE: Organism ID, Bacteria: NO GROWTH

## 2023-05-30 ENCOUNTER — Ambulatory Visit: Payer: Self-pay | Admitting: Physician Assistant

## 2023-05-30 ENCOUNTER — Other Ambulatory Visit: Payer: Self-pay | Admitting: Physician Assistant

## 2023-05-30 ENCOUNTER — Encounter: Payer: Self-pay | Admitting: Physician Assistant

## 2023-05-30 DIAGNOSIS — E669 Obesity, unspecified: Secondary | ICD-10-CM | POA: Diagnosis not present

## 2023-05-30 DIAGNOSIS — I6789 Other cerebrovascular disease: Secondary | ICD-10-CM | POA: Insufficient documentation

## 2023-05-30 DIAGNOSIS — Z9884 Bariatric surgery status: Secondary | ICD-10-CM | POA: Diagnosis not present

## 2023-05-30 MED ORDER — DOXYCYCLINE HYCLATE 100 MG PO TABS: 100.0000 mg | ORAL_TABLET | Freq: Two times a day (BID) | ORAL | 0 refills | Status: DC

## 2023-05-30 MED ORDER — FLUCONAZOLE 150 MG PO TABS: 150.0000 mg | ORAL_TABLET | Freq: Once | ORAL | 0 refills | Status: AC

## 2023-05-30 NOTE — Telephone Encounter (Signed)
 Spoke with radiology reading room and they will move up MRI to STAT read.

## 2023-05-30 NOTE — Telephone Encounter (Signed)
 Spoke with patient. She is not happy with [facility] calls-feels that they are not adequately trained to evaluate triage calls and she has frequently been told to go to ER - frustrated over this and does not want to speak to the triage call department with [facility] again. I told her that I would forward this message to manager   She states she explained to the nurse who called that she only wanted to know if she should see PCP or neurology regarding the loss of sensation in lower leg.  States she is not going to go to ER - can not afford this and does not feel that it is necessary.  She spoke to her friend who is an ER doctor and was told to see neurology for this issue.  She is scheduled tomorrow with [facility]

## 2023-05-30 NOTE — Progress Notes (Signed)
 No acute changes on MRI. You do have evidence of some paranasal sinus infection. Will send doxycyline to start.

## 2023-05-30 NOTE — Telephone Encounter (Signed)
 Copied from CRM [id number]. Topic: Clinical - Red Word Triage >> May 30, 2023  1:26 PM Martha Clan wrote: Red Word that prompted transfer to Nurse Triage: Patient cannot feel right leg. Feels like when she had a disk issue. Recently had a Pulmonary embolism.  Chief Complaint: can't feel her right leg from the knee down Symptoms: no feeling; recent dx of PE Frequency: constant Pertinent Negatives: Patient denies sob, cp Disposition: [x] ED /[] Urgent Care (no appt availability in office) / [] Appointment(In office/virtual)/ []  [place] [facility]/ [] Home Care/ [] Refused Recommended Disposition /[] [place] [facility]/ []  Follow-up with PCP Additional Notes: instructed to go to the ER.  Patient declines dispo. Wants to speak with PCP.  Office updated via crm d/t lunch break.   Reason for Disposition  History of prior "blood clot" in leg or lungs (i.e., deep vein thrombosis, pulmonary embolism)  Answer Assessment - Initial Assessment Questions 1. ONSET: "When did the pain start?"      A few days ago 2. LOCATION: "Where is the pain located?"      Right leg 3. PAIN: "How bad is the pain?"    (Scale 1-10; or mild, moderate, severe)   -  MILD (1-3): doesn't interfere with normal activities    -  MODERATE (4-7): interferes with normal activities (e.g., work or school) or awakens from sleep, limping    -  SEVERE (8-10): excruciating pain, unable to do any normal activities, unable to walk     5/10 4. WORK OR EXERCISE: "Has there been any recent work or exercise that involved this part of the body?"      Recent fall 5. CAUSE: "What do you think is causing the leg pain?"     unknown 6. OTHER SYMPTOMS: "Do you have any other symptoms?" (e.g., chest pain, back pain, breathing difficulty, swelling, rash, fever, numbness, weakness)     denies 7. PREGNANCY: "Is there any chance you are pregnant?" "When was your last menstrual period?"     na  Protocols used: Leg Pain-A-AH

## 2023-05-31 DIAGNOSIS — M5416 Radiculopathy, lumbar region: Secondary | ICD-10-CM | POA: Diagnosis not present

## 2023-05-31 DIAGNOSIS — W19XXXA Unspecified fall, initial encounter: Secondary | ICD-10-CM | POA: Diagnosis not present

## 2023-05-31 DIAGNOSIS — M47812 Spondylosis without myelopathy or radiculopathy, cervical region: Secondary | ICD-10-CM | POA: Diagnosis not present

## 2023-05-31 DIAGNOSIS — G894 Chronic pain syndrome: Secondary | ICD-10-CM | POA: Diagnosis not present

## 2023-05-31 DIAGNOSIS — Y92009 Unspecified place in unspecified non-institutional (private) residence as the place of occurrence of the external cause: Secondary | ICD-10-CM | POA: Diagnosis not present

## 2023-06-03 NOTE — Telephone Encounter (Signed)
 Thank you for bringing this to my attention. I will send this message to the [facility] supervisiors .

## 2023-06-06 ENCOUNTER — Ambulatory Visit (INDEPENDENT_AMBULATORY_CARE_PROVIDER_SITE_OTHER): Admitting: Physician Assistant

## 2023-06-06 VITALS — BP 142/83 | HR 85 | Ht 65.0 in | Wt 199.2 lb

## 2023-06-06 DIAGNOSIS — R052 Subacute cough: Secondary | ICD-10-CM | POA: Diagnosis not present

## 2023-06-06 DIAGNOSIS — R52 Pain, unspecified: Secondary | ICD-10-CM

## 2023-06-06 DIAGNOSIS — R0989 Other specified symptoms and signs involving the circulatory and respiratory systems: Secondary | ICD-10-CM

## 2023-06-06 MED ORDER — HYDROCOD POLI-CHLORPHE POLI ER 10-8 MG/5ML PO SUER: 5.0000 mL | Freq: Two times a day (BID) | ORAL | 0 refills | Status: DC | PRN

## 2023-06-06 MED ORDER — PREDNISONE 20 MG PO TABS: 20.0000 mg | ORAL_TABLET | Freq: Two times a day (BID) | ORAL | 0 refills | Status: DC

## 2023-06-06 NOTE — Progress Notes (Unsigned)
   Acute Office Visit  Subjective:     Patient ID: Julie Johnston, female    DOB: 05-Aug-1970, 53 y.o.   MRN: [id number]  No chief complaint on file.   HPI Patient is in today for ***  Exposed to RSV sat  Finished doxy  We cough       Last antibiotc  ROS      Objective:    There were no vitals taken for this visit. BP Readings from Last 3 Encounters:  06/06/23 (!) 142/83  05/05/23 (!) 175/92  05/03/23 112/68   Wt Readings from Last 3 Encounters:  06/06/23 199 lb 4 oz (90.4 kg)  05/03/23 199 lb 12.8 oz (90.6 kg)  05/02/23 201 lb (91.2 kg)      Physical Exam  No results found for any visits on 06/06/23.      Assessment & Plan:   Problem List Items Addressed This Visit   None   No orders of the defined types were placed in this encounter.   No follow-ups on file.  Tandy Gaw, PA-C

## 2023-06-07 ENCOUNTER — Encounter: Payer: Self-pay | Admitting: Physician Assistant

## 2023-06-07 DIAGNOSIS — R052 Subacute cough: Secondary | ICD-10-CM | POA: Insufficient documentation

## 2023-06-07 LAB — COVID-19, FLU A+B AND RSV
Influenza A, NAA: NOT DETECTED
Influenza B, NAA: NOT DETECTED
RSV, NAA: NOT DETECTED
SARS-CoV-2, NAA: NOT DETECTED

## 2023-06-07 NOTE — Progress Notes (Signed)
Negative for RSV.

## 2023-06-10 DIAGNOSIS — S52592A Other fractures of lower end of left radius, initial encounter for closed fracture: Secondary | ICD-10-CM | POA: Diagnosis not present

## 2023-06-10 DIAGNOSIS — M5126 Other intervertebral disc displacement, lumbar region: Secondary | ICD-10-CM | POA: Diagnosis not present

## 2023-06-10 DIAGNOSIS — M4326 Fusion of spine, lumbar region: Secondary | ICD-10-CM | POA: Diagnosis not present

## 2023-06-10 DIAGNOSIS — M25532 Pain in left wrist: Secondary | ICD-10-CM | POA: Diagnosis not present

## 2023-06-10 DIAGNOSIS — M5125 Other intervertebral disc displacement, thoracolumbar region: Secondary | ICD-10-CM | POA: Diagnosis not present

## 2023-06-10 MED ORDER — PREDNISONE 20 MG PO TABS: ORAL_TABLET | ORAL | 0 refills | Status: DC

## 2023-06-16 DIAGNOSIS — M47812 Spondylosis without myelopathy or radiculopathy, cervical region: Secondary | ICD-10-CM | POA: Diagnosis not present

## 2023-06-20 DIAGNOSIS — M47816 Spondylosis without myelopathy or radiculopathy, lumbar region: Secondary | ICD-10-CM | POA: Diagnosis not present

## 2023-06-20 DIAGNOSIS — G894 Chronic pain syndrome: Secondary | ICD-10-CM | POA: Diagnosis not present

## 2023-06-20 DIAGNOSIS — M47812 Spondylosis without myelopathy or radiculopathy, cervical region: Secondary | ICD-10-CM | POA: Diagnosis not present

## 2023-06-20 DIAGNOSIS — M7918 Myalgia, other site: Secondary | ICD-10-CM | POA: Diagnosis not present

## 2023-06-20 DIAGNOSIS — M5416 Radiculopathy, lumbar region: Secondary | ICD-10-CM | POA: Diagnosis not present

## 2023-06-20 DIAGNOSIS — S3210XS Unspecified fracture of sacrum, sequela: Secondary | ICD-10-CM | POA: Diagnosis not present

## 2023-06-20 DIAGNOSIS — M533 Sacrococcygeal disorders, not elsewhere classified: Secondary | ICD-10-CM | POA: Diagnosis not present

## 2023-06-28 ENCOUNTER — Encounter: Payer: Self-pay | Admitting: Physician Assistant

## 2023-06-28 ENCOUNTER — Other Ambulatory Visit: Payer: Self-pay | Admitting: Physician Assistant

## 2023-06-29 ENCOUNTER — Encounter (INDEPENDENT_AMBULATORY_CARE_PROVIDER_SITE_OTHER): Payer: Self-pay

## 2023-06-29 MED ORDER — TRAMADOL HCL 50 MG PO TABS: 50.0000 mg | ORAL_TABLET | Freq: Two times a day (BID) | ORAL | 0 refills | Status: AC | PRN

## 2023-07-06 DIAGNOSIS — M797 Fibromyalgia: Secondary | ICD-10-CM | POA: Diagnosis not present

## 2023-07-06 DIAGNOSIS — Z87820 Personal history of traumatic brain injury: Secondary | ICD-10-CM | POA: Diagnosis not present

## 2023-07-06 DIAGNOSIS — I10 Essential (primary) hypertension: Secondary | ICD-10-CM | POA: Diagnosis not present

## 2023-07-06 DIAGNOSIS — M542 Cervicalgia: Secondary | ICD-10-CM | POA: Diagnosis not present

## 2023-07-06 DIAGNOSIS — E785 Hyperlipidemia, unspecified: Secondary | ICD-10-CM | POA: Diagnosis not present

## 2023-07-06 DIAGNOSIS — M4722 Other spondylosis with radiculopathy, cervical region: Secondary | ICD-10-CM | POA: Diagnosis not present

## 2023-07-06 DIAGNOSIS — G4733 Obstructive sleep apnea (adult) (pediatric): Secondary | ICD-10-CM | POA: Diagnosis not present

## 2023-07-06 DIAGNOSIS — F32A Depression, unspecified: Secondary | ICD-10-CM | POA: Diagnosis not present

## 2023-07-06 DIAGNOSIS — Z6832 Body mass index (BMI) 32.0-32.9, adult: Secondary | ICD-10-CM | POA: Diagnosis not present

## 2023-07-06 DIAGNOSIS — M51369 Other intervertebral disc degeneration, lumbar region without mention of lumbar back pain or lower extremity pain: Secondary | ICD-10-CM | POA: Diagnosis not present

## 2023-07-06 DIAGNOSIS — G4700 Insomnia, unspecified: Secondary | ICD-10-CM | POA: Diagnosis not present

## 2023-07-06 DIAGNOSIS — G43101 Migraine with aura, not intractable, with status migrainosus: Secondary | ICD-10-CM | POA: Diagnosis not present

## 2023-07-06 DIAGNOSIS — F4323 Adjustment disorder with mixed anxiety and depressed mood: Secondary | ICD-10-CM | POA: Diagnosis not present

## 2023-07-06 DIAGNOSIS — E119 Type 2 diabetes mellitus without complications: Secondary | ICD-10-CM | POA: Diagnosis not present

## 2023-07-06 DIAGNOSIS — F419 Anxiety disorder, unspecified: Secondary | ICD-10-CM | POA: Diagnosis not present

## 2023-07-06 DIAGNOSIS — M5116 Intervertebral disc disorders with radiculopathy, lumbar region: Secondary | ICD-10-CM | POA: Diagnosis not present

## 2023-07-06 DIAGNOSIS — G894 Chronic pain syndrome: Secondary | ICD-10-CM | POA: Diagnosis not present

## 2023-07-06 DIAGNOSIS — F909 Attention-deficit hyperactivity disorder, unspecified type: Secondary | ICD-10-CM | POA: Diagnosis not present

## 2023-07-06 DIAGNOSIS — M47812 Spondylosis without myelopathy or radiculopathy, cervical region: Secondary | ICD-10-CM | POA: Diagnosis not present

## 2023-07-06 DIAGNOSIS — Z79899 Other long term (current) drug therapy: Secondary | ICD-10-CM | POA: Diagnosis not present

## 2023-07-14 ENCOUNTER — Other Ambulatory Visit: Payer: Self-pay | Admitting: Physician Assistant

## 2023-07-15 DIAGNOSIS — S52592D Other fractures of lower end of left radius, subsequent encounter for closed fracture with routine healing: Secondary | ICD-10-CM | POA: Diagnosis not present

## 2023-07-15 DIAGNOSIS — S52502D Unspecified fracture of the lower end of left radius, subsequent encounter for closed fracture with routine healing: Secondary | ICD-10-CM | POA: Diagnosis not present

## 2023-07-15 DIAGNOSIS — S6982XA Other specified injuries of left wrist, hand and finger(s), initial encounter: Secondary | ICD-10-CM | POA: Diagnosis not present

## 2023-07-15 DIAGNOSIS — S63502D Unspecified sprain of left wrist, subsequent encounter: Secondary | ICD-10-CM | POA: Diagnosis not present

## 2023-07-15 DIAGNOSIS — M25532 Pain in left wrist: Secondary | ICD-10-CM | POA: Diagnosis not present

## 2023-07-17 ENCOUNTER — Other Ambulatory Visit: Payer: Self-pay | Admitting: Physician Assistant

## 2023-07-18 ENCOUNTER — Encounter: Payer: Self-pay | Admitting: Physician Assistant

## 2023-07-18 DIAGNOSIS — M5416 Radiculopathy, lumbar region: Secondary | ICD-10-CM | POA: Diagnosis not present

## 2023-07-18 DIAGNOSIS — G894 Chronic pain syndrome: Secondary | ICD-10-CM | POA: Diagnosis not present

## 2023-07-18 DIAGNOSIS — G8929 Other chronic pain: Secondary | ICD-10-CM | POA: Diagnosis not present

## 2023-07-18 DIAGNOSIS — S3210XD Unspecified fracture of sacrum, subsequent encounter for fracture with routine healing: Secondary | ICD-10-CM | POA: Diagnosis not present

## 2023-07-18 DIAGNOSIS — M25571 Pain in right ankle and joints of right foot: Secondary | ICD-10-CM | POA: Diagnosis not present

## 2023-07-18 DIAGNOSIS — S96911A Strain of unspecified muscle and tendon at ankle and foot level, right foot, initial encounter: Secondary | ICD-10-CM | POA: Diagnosis not present

## 2023-07-18 DIAGNOSIS — M810 Age-related osteoporosis without current pathological fracture: Secondary | ICD-10-CM | POA: Diagnosis not present

## 2023-07-22 ENCOUNTER — Telehealth: Payer: Self-pay

## 2023-07-22 NOTE — Telephone Encounter (Signed)
 Copied from CRM [id number]. Topic: Clinical - Medical Advice >> Jul 22, 2023 12:40 PM Julie Johnston F wrote: Reason for CRM:   Patient is asking to be called regarding a matter she sent over a message about on Monday. Message is as follows: "Hi - I saw a neurosurgeon at [facility] today to talk about options for my lumbar spine.  One thing he mentioned was a trasforaminal steroid injection, but i can't get that while I'm on Pradaxa . Is it likely I'll be able to stop that after 3 months? Is there a test to determine whether the clot is still there? Thank you. "   Please call patient back with an update by end of business day as she is becoming frustrated by the lack of communication.   Callback Number: [phone]

## 2023-07-22 NOTE — Telephone Encounter (Signed)
 Patient has viewed message in Mychart - also verified by phone that message from provider with response was reviewed.  She will be sending the provider a Mychart message with questions she has regarding this as well.

## 2023-07-24 ENCOUNTER — Encounter

## 2023-07-25 ENCOUNTER — Ambulatory Visit (INDEPENDENT_AMBULATORY_CARE_PROVIDER_SITE_OTHER): Admitting: Medical-Surgical

## 2023-07-25 ENCOUNTER — Encounter: Payer: Self-pay | Admitting: Medical-Surgical

## 2023-07-25 ENCOUNTER — Ambulatory Visit: Payer: Self-pay

## 2023-07-25 VITALS — BP 118/73 | HR 75 | Resp 20 | Ht 65.0 in | Wt 202.0 lb

## 2023-07-25 DIAGNOSIS — Z59819 Housing instability, housed unspecified: Secondary | ICD-10-CM | POA: Diagnosis not present

## 2023-07-25 DIAGNOSIS — Z79899 Other long term (current) drug therapy: Secondary | ICD-10-CM | POA: Diagnosis not present

## 2023-07-25 DIAGNOSIS — L237 Allergic contact dermatitis due to plants, except food: Secondary | ICD-10-CM | POA: Diagnosis not present

## 2023-07-25 DIAGNOSIS — Z5941 Food insecurity: Secondary | ICD-10-CM

## 2023-07-25 MED ORDER — DEXAMETHASONE SODIUM PHOSPHATE 10 MG/ML IJ SOLN
10.0000 mg | Freq: Once | INTRAMUSCULAR | Status: AC
  Administered 2023-07-25: 10 mg via INTRAMUSCULAR

## 2023-07-25 MED ORDER — HYDROXYZINE PAMOATE 25 MG PO CAPS: 25.0000 mg | ORAL_CAPSULE | Freq: Three times a day (TID) | ORAL | 0 refills | Status: DC | PRN

## 2023-07-25 MED ORDER — PREDNISONE 10 MG (48) PO TBPK: ORAL_TABLET | Freq: Every day | ORAL | 0 refills | Status: DC

## 2023-07-25 NOTE — Telephone Encounter (Signed)
 Copied from CRM [id number]. Topic: Clinical - Medication Question >> Jul 25, 2023  5:13 PM Karole Pacer C wrote: Reason for CRM: Patient would like a call back at [phone]/ Patient would like to know if the provider wants her to start the following medication: predniSONE  today or tomorrow? She would also like to know if she should be taking this medication: hydrOXYzine (VISTARIL) with or without benadryl.
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
## 2023-07-26 ENCOUNTER — Telehealth: Payer: Self-pay | Admitting: *Deleted

## 2023-07-26 NOTE — Progress Notes (Signed)
 Complex Care Management Note  Care Guide Note 07/26/2023 Name: Julie Johnston MRN: [id number] DOB: 1970-12-07  [name] is a 53 y.o. year old female who sees [place], Elvie Hammed, PA-C for primary care. I reached out to Laporsha Finck by phone today to offer complex care management services.  Ms. Ribordy was given information about Complex Care Management services today including:   The Complex Care Management services include support from the care team which includes your Nurse Care Manager, Clinical Social Worker, or Pharmacist.  The Complex Care Management team is here to help remove barriers to the health concerns and goals most important to you. Complex Care Management services are voluntary, and the patient may decline or stop services at any time by request to their care team member.   Complex Care Management Consent Status: Patient agreed to services and verbal consent obtained.   Follow up plan:  Telephone appointment with complex care management team member scheduled for:  08/02/2023 and 08/09/2023  Encounter Outcome:  Patient Scheduled  Kandis Ormond, CMA [place]  [facility] Guide Direct Dial: [phone]  Fax: [phone] Website: [email or web address]

## 2023-07-26 NOTE — Telephone Encounter (Signed)
 This request has been handled. No further action is required. Patient starting taking the prednisone  today.

## 2023-07-27 ENCOUNTER — Encounter: Payer: Self-pay | Admitting: Physician Assistant

## 2023-07-29 MED ORDER — WARFARIN SODIUM 2 MG PO TABS
2.0000 mg | ORAL_TABLET | Freq: Every day | ORAL | 0 refills | Status: DC
Start: 2023-07-29 — End: 2023-08-09

## 2023-07-29 NOTE — Telephone Encounter (Signed)
 Patient was provided with the information from the mfg website for the pradaxa . Patient is worry about not getting the rx and does not want to wait until Monday. She is requesting for the provider to send in the rx for coumadin today. Patient states an update can be sent via MyChart if provider is agreeable to send in coumadin rx. Please advise, thanks.

## 2023-07-29 NOTE — Telephone Encounter (Signed)
 Will send coumadin 2 mg daily she will need to have an INR checked on Monday or Tuesday we recommend taking Coumadin in the evening.  She will need to avoid eating a lot of leafy green vegetables that can affect the Coumadin level.  Just so she understands this is not typically how we start Coumadin usually we bridge with Lovenox until the INR is therapeutic on Coumadin but I know that this is a cost issue and she is stuck between a rock and a hard place but I think we could do this for now until we can figure out if the Pradaxa  could be covered in a different way with either a coupon etc.  Meds ordered this encounter  Medications   warfarin (COUMADIN) 2 MG tablet    Sig: Take 1 tablet (2 mg total) by mouth daily.    Dispense:  30 tablet    Refill:  0

## 2023-08-02 ENCOUNTER — Other Ambulatory Visit: Payer: Self-pay | Admitting: Licensed Clinical Social Worker

## 2023-08-02 DIAGNOSIS — R600 Localized edema: Secondary | ICD-10-CM | POA: Diagnosis not present

## 2023-08-02 DIAGNOSIS — S52512D Displaced fracture of left radial styloid process, subsequent encounter for closed fracture with routine healing: Secondary | ICD-10-CM | POA: Diagnosis not present

## 2023-08-02 DIAGNOSIS — S52592A Other fractures of lower end of left radius, initial encounter for closed fracture: Secondary | ICD-10-CM | POA: Diagnosis not present

## 2023-08-02 DIAGNOSIS — M65942 Unspecified synovitis and tenosynovitis, left hand: Secondary | ICD-10-CM | POA: Diagnosis not present

## 2023-08-02 DIAGNOSIS — M1812 Unilateral primary osteoarthritis of first carpometacarpal joint, left hand: Secondary | ICD-10-CM | POA: Diagnosis not present

## 2023-08-02 DIAGNOSIS — S52502D Unspecified fracture of the lower end of left radius, subsequent encounter for closed fracture with routine healing: Secondary | ICD-10-CM | POA: Diagnosis not present
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
## 2023-08-08 ENCOUNTER — Other Ambulatory Visit: Payer: Self-pay | Admitting: Medical-Surgical

## 2023-08-08 ENCOUNTER — Encounter: Payer: Self-pay | Admitting: Medical-Surgical

## 2023-08-08 MED ORDER — HYDROXYZINE PAMOATE 25 MG PO CAPS: 25.0000 mg | ORAL_CAPSULE | Freq: Three times a day (TID) | ORAL | 0 refills | Status: DC | PRN

## 2023-08-08 NOTE — Addendum Note (Signed)
 Addended by: [name] on: 08/08/2023 04:26 PM   Modules accepted: Orders

## 2023-08-08 NOTE — Telephone Encounter (Signed)
 Ok I will refill. Sent to pharmacy.

## 2023-08-08 NOTE — Telephone Encounter (Signed)
 Sorry about send this to you Jade. Should I forward this to [name] for review instead ?  Spoke with patient she is using for sumac rash that is mostly better but still has some itchy spots.  She states she was taking previous script every 8 hours and this is why the script has been used up.  Benadryl caused her to be too sleepy.  She is still needing the refill and knows that this is not a long term medication . She is requesting the refill be sent to use for remaining pruritic sites.

## 2023-08-08 NOTE — Telephone Encounter (Signed)
 Patient requesting rx for hydroxyzine  25mcg  Last written 07/25/2023 Last OV 07/25/2023 Upcoming appt = none

## 2023-08-09 ENCOUNTER — Telehealth: Payer: Self-pay

## 2023-08-09 ENCOUNTER — Other Ambulatory Visit: Payer: Self-pay

## 2023-08-09 NOTE — Progress Notes (Signed)
 08/09/2023 Name: Julie Johnston MRN: [id number] DOB: 1970/06/20  Chief Complaint  Patient presents with   Medication Assistance   Julie Johnston is a 53 y.o. year old female who presented for a telephone visit.   They were referred to the pharmacist by their PCP for assistance in managing medication access.    Subjective:  Care Team: Primary Care Provider: Breeback, Jade L, PA-C ;  Medication Access/Adherence  Current Pharmacy:  [id number] [name], [place] - [address] [place] [address] Phone: [phone] Fax: [phone]  [facility] - [place], [place] - [address], [address] [place] [address] Phone: [phone] Fax: [phone]  [facility] - [place], [place] - [address] [place] [address] Phone: [phone] Fax: [phone]  [id number] - [place], [place] - [address] AT [facility] [address] [place] [address] Phone: [phone] Fax: [phone]  -Patient reports affordability concerns with their medications: Yes  Patient unable to afford Dabigatran  150mg  BID moving forward, so she has been changed to warfarin 2mg  daily and took first dose last night Diagnosed with PE 2/10 thought to be secondary to COVID infection Patient completed 30 day Eliquis  starter pack, then 2 months of Dabigatran  150mg  BID Patient also states insurance does not cover eszopiclone 3mg , so she is using GoodRx to fill medication Receives Emsam 12mg  and Aimovig  140mg  through patient assistance programs, and Aimovig  program is due for re-enrolment   -Patient reports access/transportation concerns to their pharmacy: No   -Patient reports adherence concerns with their medications:  Yes   Currently without Aimovig  due to PAP renewal needing to be completed  Objective:  Medications Reviewed Today     Reviewed by Julie Johnston, Johnston (Pharmacist) on 08/09/23 at 1127  Med List Status: <None>   Medication Order Taking? Sig Documenting Provider Last Dose Status Informant  albuterol  (VENTOLIN  HFA) 108 (90 Base) MCG/ACT inhaler [id number] Yes Inhale 1-2 puffs into the lungs every 6 (six) hours as needed for wheezing or shortness of breath. Breeback, Jade L, PA-C Taking Active            Med Note Julie Johnston, Julie Johnston A   Tue Aug 09, 2023 11:22 AM) Has not needed recently  Erenumab -aooe (AIMOVIG ) 140 MG/ML SOAJ [id number] Yes INJECT 140 MG INTO THE SKIN EVERY 30 DAYS Julie Julie Johnston Taking Active            Med Note Julie Johnston, Lorah Kalina A   Tue Aug 09, 2023 11:22 AM) PAP needs renewal  Eszopiclone 3 MG TABS [id number] Yes Take 3 mg by mouth at bedtime as needed. [provider] Taking Active   fluticasone  (FLONASE ) 50 MCG/ACT nasal spray [id number] Yes SPRAY 2 SPRAYS INTO EACH NOSTRIL EVERY DAY Breeback, Jade L, PA-C Taking Active            Med Note Julie Johnston, Navid Lenzen A   Tue Aug 09, 2023 11:22 AM) As needed  gabapentin  (NEURONTIN ) 800 MG tablet [id number] Yes TAKE 1 TABLET BY MOUTH THREE TIMES A DAY Breeback, Jade L, PA-C Taking Active   hydrOXYzine  (VISTARIL ) 25 MG capsule [id number] Yes Take 1 capsule (25 mg total) by mouth every 8 (eight) hours as needed for itching. Breeback, Jade L, PA-C Taking Active            Med Note ([name] A   Tue  Aug 09, 2023 11:22 AM) Will complete course soon  mupirocin  ointment (BACTROBAN ) 2 % [id number] Yes APPLY TO AFFECTED AREA 3 TIMES A DAY FOR 7 DAYS Breeback, Jade L, PA-C Taking Active            Med Note Julie Johnston, Marabelle Cushman A   Tue Aug 09, 2023 11:23 AM) As needed  nystatin  cream (MYCOSTATIN ) [id number] Yes Apply 1 Application topically 2 (two) times daily. Breeback, Jade L, PA-C Taking Active            Med Note [name]   Wed Oct 06, 2022  2:30 PM) As needed  omeprazole  (PRILOSEC) 20 MG capsule [id number] Yes Take 1 capsule (20 mg total) by mouth daily. Breeback, Jade L,  PA-C Taking Active   predniSONE  (STERAPRED UNI-PAK 48 TAB) 10 MG (48) TBPK tablet [id number] Yes Take by mouth daily. 12-day taper pack, use as directed for taper [name], [name], NP Taking Active            Med Note Julie Johnston, [name] A   Tue Aug 09, 2023 11:23 AM) Will complete soon  rizatriptan  (MAXALT -MLT) 10 MG disintegrating tablet [id number] Yes TAKE 1 TABLET BY MOUTH AS NEEDED FOR MIGRAINE. MAY REPEAT IN 2 HOURS IF NEEDED Breeback, Jade L, PA-C Taking Active            Med Note Julie Johnston, Nicollette Wilhelmi A   Tue Aug 09, 2023 11:27 AM) Rarely using  selegiline (EMSAM) 9 MG/24HR [id number] Yes Place 12 mg onto the skin daily. Pt takes 12mg  [provider] Taking Active            Med Note Julie Johnston, Ary Rudnick A   Mon May 16, 2023 10:16 AM) PAP  tiZANidine  (ZANAFLEX ) 2 MG tablet [id number] Yes TAKE 1 TAB AT BEDTIME FOR 1 WEEK, THEN 1 TAB TWICE DAILY FOR 1 WEEK, THEN 1 TAB 3 TIMES DAILY Hill, Marvina Slough, MD Taking Active            Med Note Julie Johnston, [name] A   Tue Aug 09, 2023 11:27 AM) Takes TID  traZODone (DESYREL) 100 MG tablet [id number] Yes Take by mouth at bedtime as needed. [provider] Taking Active   valACYclovir  (VALTREX ) 1000 MG tablet [id number] Yes Take 2 tablets (2,000 mg total) by mouth 2 (two) times daily. For one day. [name], PA-C Taking Active            Med Note [name]   Tue Apr 06, 2022 11:02 AM) As needed           Assessment/Plan:   Medication Access/Adherence -Based on plan for 3 months of anticoagulation, patient does not require additional pharmacotherapy -Recommended contacting PCP/going to ED immediately if symptoms of SOB or heart palpitations return -Coordinating with medication assistance team and provider to assist with PAP re-enrollment for Aimovig  -Patient does not qualify for Medicaid or [place] Medicare Extrra Help; but she may qualify for [place] [facility], which would provide albuterol  rescue inhaler, fluticasone  NS, omeprazole  20mg ,  rizatriptan , and trazodone at no cost.  Sending MyChart message with program information and my direct number in case further needs arise  Julie Johnston, PharmD, DPLA

## 2023-08-09 NOTE — Telephone Encounter (Signed)
 PAP: Patient assistance application for Aimovig through [employer] has been mailed to pt's home address on file. Provider portion of application will be faxed to provider's office.

## 2023-08-10 ENCOUNTER — Ambulatory Visit

## 2023-08-10 ENCOUNTER — Telehealth: Payer: Self-pay

## 2023-08-10 NOTE — Telephone Encounter (Signed)
 [employer] Aimovig  assistance form received.

## 2023-08-11 MED ORDER — AIMOVIG 140 MG/ML ~~LOC~~ SOAJ: SUBCUTANEOUS | 2 refills | Status: AC

## 2023-08-11 NOTE — Telephone Encounter (Signed)
 Forms signed.

## 2023-08-17 ENCOUNTER — Telehealth: Payer: Self-pay

## 2023-08-17 NOTE — Telephone Encounter (Signed)
 Forwarding message to Dr. Greer Leak covering Sandy Crumb,

## 2023-08-17 NOTE — Telephone Encounter (Signed)
 Copied from CRM [id number]. Topic: Clinical - Medical Advice >> Aug 17, 2023 10:35 AM Shelby Dessert H wrote: Reason for CRM: Patient called and stated that she had a pulmonary embolism in February and she completed 3 months of treatment with blood thinners finished a week ago on the 20th and since then the patient has been very tired and the last couple of days she has felt pain when she inhales deeply, on scale 1-10 pain level is a 2-3, patient is wanting to know what she needs to do, what the next steps would be or is there something the provider would recommend, patient is requesting a callback from the clinic nurse or MA, patients callback number is [phone]. Patient refused NT.

## 2023-08-17 NOTE — Telephone Encounter (Signed)
 Needs appt

## 2023-08-18 ENCOUNTER — Telehealth: Payer: Self-pay

## 2023-08-18 ENCOUNTER — Ambulatory Visit (INDEPENDENT_AMBULATORY_CARE_PROVIDER_SITE_OTHER): Admitting: Family Medicine

## 2023-08-18 ENCOUNTER — Ambulatory Visit: Admitting: Family Medicine

## 2023-08-18 ENCOUNTER — Ambulatory Visit (HOSPITAL_BASED_OUTPATIENT_CLINIC_OR_DEPARTMENT_OTHER)
Admission: RE | Admit: 2023-08-18 | Discharge: 2023-08-18 | Disposition: A | Discharge status: Home / Self Care | Source: Ambulatory Visit | Attending: Family Medicine | Admitting: Family Medicine

## 2023-08-18 ENCOUNTER — Encounter: Payer: Self-pay | Admitting: Family Medicine

## 2023-08-18 VITALS — BP 123/83 | HR 76 | Ht 65.0 in | Wt 204.2 lb

## 2023-08-18 DIAGNOSIS — R079 Chest pain, unspecified: Secondary | ICD-10-CM | POA: Diagnosis not present

## 2023-08-18 DIAGNOSIS — Z86711 Personal history of pulmonary embolism: Secondary | ICD-10-CM | POA: Diagnosis not present

## 2023-08-18 DIAGNOSIS — R0781 Pleurodynia: Secondary | ICD-10-CM | POA: Diagnosis not present

## 2023-08-18 DIAGNOSIS — R918 Other nonspecific abnormal finding of lung field: Secondary | ICD-10-CM | POA: Diagnosis not present

## 2023-08-18 MED ORDER — IOHEXOL 350 MG/ML SOLN
100.0000 mL | Freq: Once | INTRAVENOUS | Status: AC | PRN
  Administered 2023-08-18: 100 mL via INTRAVENOUS

## 2023-08-18 NOTE — Telephone Encounter (Signed)
 Per another call, patient did not want to see Dr. Courtland Ditch due to "bad google reviews". Patient wanted to see their provider today. Informed the provider is currently out of the office. The provider next available was 08/22/23. Patient refused to wait until then to be seen. Scheduled with Dr. Augustus Ledger at 310 pm.

## 2023-08-18 NOTE — Telephone Encounter (Signed)
 Copied from CRM [id number]. Topic: General - Running Late >> Aug 18, 2023  1:10 PM Lynnie Saucier S wrote: Patient/patient representative is calling because they are running late for an appointment. Patient should arrive at 1:23 PM per GPS.
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
## 2023-08-18 NOTE — Telephone Encounter (Signed)
 Attempted call to patient. Left a voice mail message requesting a return call. Gave return contact # as well as my direct # for return call.

## 2023-08-18 NOTE — Assessment & Plan Note (Signed)
 She recently discontinued her anticoagulation for treatment of pulmonary embolism after completion of course.  She now has new pleuritic chest pain concerning for PE.  I have ordered a stat CT angio of the chest.  She also has had some fatigue and will check CMP and CBC.

## 2023-08-18 NOTE — Telephone Encounter (Signed)
 Patient scheduled today with DR. Courtland Ditch at 1:10pm

## 2023-08-19 ENCOUNTER — Other Ambulatory Visit: Payer: Self-pay | Admitting: Family Medicine

## 2023-08-19 ENCOUNTER — Ambulatory Visit: Payer: Self-pay | Admitting: Family Medicine

## 2023-08-19 LAB — CBC WITH DIFFERENTIAL/PLATELET
Basophils Absolute: 0 10*3/uL (ref 0.0–0.2)
Basos: 0 %
EOS (ABSOLUTE): 0.2 10*3/uL (ref 0.0–0.4)
Eos: 4 %
Hematocrit: 41.9 % (ref 34.0–46.6)
Hemoglobin: 13.9 g/dL (ref 11.1–15.9)
Immature Grans (Abs): 0 10*3/uL (ref 0.0–0.1)
Immature Granulocytes: 0 %
Lymphocytes Absolute: 1.9 10*3/uL (ref 0.7–3.1)
Lymphs: 31 %
MCH: 30.8 pg (ref 26.6–33.0)
MCHC: 33.2 g/dL (ref 31.5–35.7)
MCV: 93 fL (ref 79–97)
Monocytes Absolute: 0.4 10*3/uL (ref 0.1–0.9)
Monocytes: 6 %
Neutrophils Absolute: 3.5 10*3/uL (ref 1.4–7.0)
Neutrophils: 59 %
Platelets: 242 10*3/uL (ref 150–450)
RBC: 4.52 x10E6/uL (ref 3.77–5.28)
RDW: 12.4 % (ref 11.7–15.4)
WBC: 6.1 10*3/uL (ref 3.4–10.8)

## 2023-08-19 LAB — CMP14+EGFR
ALT: 27 IU/L (ref 0–32)
AST: 26 IU/L (ref 0–40)
Albumin: 4 g/dL (ref 3.8–4.9)
Alkaline Phosphatase: 109 IU/L (ref 44–121)
BUN/Creatinine Ratio: 23 (ref 9–23)
BUN: 14 mg/dL (ref 6–24)
Bilirubin Total: 0.5 mg/dL (ref 0.0–1.2)
CO2: 19 mmol/L — ABNORMAL LOW (ref 20–29)
Calcium: 9.7 mg/dL (ref 8.7–10.2)
Chloride: 105 mmol/L (ref 96–106)
Creatinine, Ser: 0.6 mg/dL (ref 0.57–1.00)
Globulin, Total: 2.6 g/dL (ref 1.5–4.5)
Glucose: 78 mg/dL (ref 70–99)
Potassium: 4.5 mmol/L (ref 3.5–5.2)
Sodium: 141 mmol/L (ref 134–144)
Total Protein: 6.6 g/dL (ref 6.0–8.5)
eGFR: 107 mL/min/{1.73_m2} (ref >59)

## 2023-08-19 MED ORDER — AZITHROMYCIN 250 MG PO TABS: ORAL_TABLET | ORAL | 0 refills | Status: AC

## 2023-08-19 MED ORDER — FLUCONAZOLE 150 MG PO TABS: 150.0000 mg | ORAL_TABLET | Freq: Once | ORAL | 0 refills | Status: AC

## 2023-08-19 MED ORDER — AMOXICILLIN-POT CLAVULANATE 875-125 MG PO TABS: 1.0000 | ORAL_TABLET | Freq: Two times a day (BID) | ORAL | 0 refills | Status: DC

## 2023-08-26 ENCOUNTER — Other Ambulatory Visit: Payer: Self-pay | Admitting: Physician Assistant

## 2023-08-26 DIAGNOSIS — Z9884 Bariatric surgery status: Secondary | ICD-10-CM

## 2023-08-26 DIAGNOSIS — Z79899 Other long term (current) drug therapy: Secondary | ICD-10-CM

## 2023-08-29 ENCOUNTER — Ambulatory Visit: Payer: Self-pay | Admitting: Physician Assistant

## 2023-08-29 ENCOUNTER — Telehealth: Payer: Self-pay | Admitting: Physician Assistant

## 2023-08-29 ENCOUNTER — Ambulatory Visit (INDEPENDENT_AMBULATORY_CARE_PROVIDER_SITE_OTHER): Admitting: Physician Assistant

## 2023-08-29 ENCOUNTER — Ambulatory Visit

## 2023-08-29 ENCOUNTER — Ambulatory Visit (INDEPENDENT_AMBULATORY_CARE_PROVIDER_SITE_OTHER)

## 2023-08-29 DIAGNOSIS — R350 Frequency of micturition: Secondary | ICD-10-CM

## 2023-08-29 DIAGNOSIS — R052 Subacute cough: Secondary | ICD-10-CM

## 2023-08-29 DIAGNOSIS — R0781 Pleurodynia: Secondary | ICD-10-CM

## 2023-08-29 DIAGNOSIS — Z8701 Personal history of pneumonia (recurrent): Secondary | ICD-10-CM | POA: Diagnosis not present

## 2023-08-29 DIAGNOSIS — R918 Other nonspecific abnormal finding of lung field: Secondary | ICD-10-CM | POA: Diagnosis not present

## 2023-08-29 DIAGNOSIS — Z86711 Personal history of pulmonary embolism: Secondary | ICD-10-CM

## 2023-08-29 LAB — POCT URINALYSIS DIP (CLINITEK)
Bilirubin, UA: NEGATIVE
Blood, UA: NEGATIVE
Glucose, UA: NEGATIVE mg/dL
Ketones, POC UA: NEGATIVE mg/dL
Leukocytes, UA: NEGATIVE
Nitrite, UA: NEGATIVE
POC PROTEIN,UA: NEGATIVE
Spec Grav, UA: <=1.005 — AB (ref 1.010–1.025)
Urobilinogen, UA: 0.2 U/dL
pH, UA: 5.5 (ref 5.0–8.0)

## 2023-08-29 NOTE — Telephone Encounter (Signed)
 Copied from CRM [id number]. Topic: General - Running Late >> Aug 29, 2023  3:02 PM Julie Johnston wrote: Patient is calling to let the clinic know she is running about 10 mins late because she had to get a jump start for her car, she also needs to be seen per her because she is almost positive she has a UTI, I instructed the patient to go ahead and come on.   Patient showed up late and was upset she was told be E2C2 to come in anyway

## 2023-08-29 NOTE — Progress Notes (Signed)
Please culture.

## 2023-08-29 NOTE — Progress Notes (Signed)
 Pt here for possible UTI.                             Pt UA came back normal

## 2023-08-29 NOTE — Progress Notes (Signed)
 GREAT news. Looks like pneumonia is clearing. You still have some residual inflammation of the bronchials. I see you have taken augmentin  but have you had steroid. This could help with inflammation. Are you using your albuterol  inhaler? Ok to use every 2-6 hours as needed for shortness of breath/cough.

## 2023-08-30 ENCOUNTER — Ambulatory Visit (INDEPENDENT_AMBULATORY_CARE_PROVIDER_SITE_OTHER): Admitting: Physician Assistant

## 2023-08-30 ENCOUNTER — Encounter: Payer: Self-pay | Admitting: Physician Assistant

## 2023-08-30 VITALS — BP 125/73 | HR 90 | Ht 65.0 in | Wt 204.2 lb

## 2023-08-30 DIAGNOSIS — R0781 Pleurodynia: Secondary | ICD-10-CM | POA: Diagnosis not present

## 2023-08-30 DIAGNOSIS — R0602 Shortness of breath: Secondary | ICD-10-CM

## 2023-08-30 DIAGNOSIS — Z86711 Personal history of pulmonary embolism: Secondary | ICD-10-CM

## 2023-08-30 DIAGNOSIS — J209 Acute bronchitis, unspecified: Secondary | ICD-10-CM | POA: Diagnosis not present

## 2023-08-30 DIAGNOSIS — Z7289 Other problems related to lifestyle: Secondary | ICD-10-CM

## 2023-08-30 DIAGNOSIS — R5383 Other fatigue: Secondary | ICD-10-CM | POA: Diagnosis not present

## 2023-08-30 NOTE — Telephone Encounter (Signed)
 Opened CRM #[id number] and routed to [facility] supervisor to review.

## 2023-08-30 NOTE — Patient Instructions (Addendum)
 Referral in for pulmonology Will get echo

## 2023-08-30 NOTE — Progress Notes (Unsigned)
   Established Patient Office Visit  Subjective   Patient ID: Julie Johnston, female    DOB: 1970-10-22  Age: 53 y.o. MRN: [id number]  No chief complaint on file.   HPI  CTA 5/29  No PE ? pneumonia {History (Optional):23778}  ROS    Objective:     There were no vitals taken for this visit. {Vitals History (Optional):23777}  Physical Exam   No results found for any visits on 08/30/23.  {Labs (Optional):23779}  The ASCVD Risk score (Arnett DK, et al., 2019) failed to calculate for the following reasons:   Cannot find a previous HDL lab   Cannot find a previous total cholesterol lab    Assessment & Plan:   Problem List Items Addressed This Visit   None   No follow-ups on file.    Luzelena Heeg, PA-C

## 2023-08-30 NOTE — Progress Notes (Signed)
 Pt was over 10 minutes late for appt. She was seen for nurse visit UA check and CXR ordered to follow up on pneumonia.   UA looked great with no concerns.  R/S for tomorrow.

## 2023-08-31 ENCOUNTER — Encounter: Payer: Self-pay | Admitting: Physician Assistant

## 2023-08-31 ENCOUNTER — Ambulatory Visit: Admitting: Physician Assistant

## 2023-08-31 DIAGNOSIS — J209 Acute bronchitis, unspecified: Secondary | ICD-10-CM | POA: Insufficient documentation

## 2023-08-31 DIAGNOSIS — R0781 Pleurodynia: Secondary | ICD-10-CM

## 2023-08-31 DIAGNOSIS — R0602 Shortness of breath: Secondary | ICD-10-CM

## 2023-08-31 DIAGNOSIS — Z86711 Personal history of pulmonary embolism: Secondary | ICD-10-CM

## 2023-08-31 DIAGNOSIS — Z7289 Other problems related to lifestyle: Secondary | ICD-10-CM | POA: Insufficient documentation

## 2023-08-31 NOTE — Telephone Encounter (Signed)
 Julie Johnston -with card imaging scheduling returned call but was disconnected  Calling form [phone]  Called and requested a return call.

## 2023-08-31 NOTE — Telephone Encounter (Signed)
 Called [facility] - left a voice mail message requesting a return call to assist with moving up echocardiogram appt

## 2023-08-31 NOTE — Telephone Encounter (Signed)
 Can you check on pulmonology referral? Can we see if we can move up echo?

## 2023-08-31 NOTE — Telephone Encounter (Signed)
 Spoke with Abe Abed- can move up Echocardiogram but will be in [place]. She had left voice mail message for patient.  Called patient and informed. Given contact information for Abe Abed [phone]- patient will contact Abe Abed regarding moving up echocardiogram.

## 2023-08-31 NOTE — Telephone Encounter (Signed)
 Reached out to patient and she has received the Pap application for Aimovig . Will try to return to us  soon.

## 2023-08-31 NOTE — Telephone Encounter (Signed)
 Echocardiogram Appointment has rescheduled 09/05/23 at 2:00pm. Pt aware.

## 2023-09-01 NOTE — Telephone Encounter (Signed)
 I have no pulmonology referral for 2025?

## 2023-09-03 LAB — HISTOPLASMA ANTIBODIES, QN,DID: Histoplasma Ab, Immunodiffusion: NEGATIVE

## 2023-09-03 LAB — CHLAMYDIA PSITTACI IGM AB: Chlamydia psittaci IgM Ab: <1:10 {titer}

## 2023-09-05 ENCOUNTER — Telehealth: Payer: Self-pay | Admitting: Neurology

## 2023-09-05 ENCOUNTER — Encounter: Payer: Self-pay | Admitting: Physician Assistant

## 2023-09-05 ENCOUNTER — Ambulatory Visit: Payer: Self-pay | Admitting: Physician Assistant

## 2023-09-05 ENCOUNTER — Ambulatory Visit (HOSPITAL_COMMUNITY)
Admission: RE | Admit: 2023-09-05 | Discharge: 2023-09-05 | Disposition: A | Discharge status: Home / Self Care | Source: Ambulatory Visit | Attending: Physician Assistant | Admitting: Physician Assistant

## 2023-09-05 DIAGNOSIS — R0602 Shortness of breath: Secondary | ICD-10-CM | POA: Diagnosis not present

## 2023-09-05 DIAGNOSIS — I10 Essential (primary) hypertension: Secondary | ICD-10-CM | POA: Insufficient documentation

## 2023-09-05 DIAGNOSIS — F900 Attention-deficit hyperactivity disorder, predominantly inattentive type: Secondary | ICD-10-CM

## 2023-09-05 DIAGNOSIS — F411 Generalized anxiety disorder: Secondary | ICD-10-CM

## 2023-09-05 DIAGNOSIS — F332 Major depressive disorder, recurrent severe without psychotic features: Secondary | ICD-10-CM

## 2023-09-05 DIAGNOSIS — F4310 Post-traumatic stress disorder, unspecified: Secondary | ICD-10-CM

## 2023-09-05 LAB — ECHOCARDIOGRAM COMPLETE
AR max vel: 2.53 cm2
AV Area VTI: 2.92 cm2
AV Area mean vel: 2.47 cm2
AV Mean grad: 5 mmHg
AV Peak grad: 8.9 mmHg
Ao pk vel: 1.49 m/s
Area-P 1/2: 3.76 cm2
S' Lateral: 2.7 cm

## 2023-09-05 NOTE — Progress Notes (Signed)
 Echo has great ejection fraction. No abnormalities.

## 2023-09-05 NOTE — Telephone Encounter (Signed)
 Pt is calling to see if she can come by before she heads home to [facility] and get a sample of aimovig 

## 2023-09-05 NOTE — Progress Notes (Signed)
Negative antibodies.

## 2023-09-05 NOTE — Telephone Encounter (Signed)
 Aimovig  sample given.   Advised patient when she arrived to pikc up samples of Aimovig .  I sent over a script to [facility] through the Assistance back in May. Please check to see if that was received if not please let us  know and we will resend it.  Patient stated she will check at home as well to make sure on her end she sent in her neccessary paperwork.

## 2023-09-05 NOTE — Telephone Encounter (Signed)
 Pt wants to know if she can come by and get a sample of the aimovig   today. She has a imaging appt at 2:00 today and she come before or after that

## 2023-09-06 NOTE — Telephone Encounter (Signed)
 Please see mychart message sent by pt and advise.

## 2023-09-12 ENCOUNTER — Encounter: Payer: Self-pay | Admitting: Physician Assistant

## 2023-09-12 MED ORDER — AIRSUPRA 90-80 MCG/ACT IN AERO: 2.0000 | INHALATION_SPRAY | Freq: Four times a day (QID) | RESPIRATORY_TRACT | 1 refills | Status: DC | PRN

## 2023-09-14 ENCOUNTER — Encounter: Payer: Self-pay | Admitting: Physician Assistant

## 2023-09-14 ENCOUNTER — Other Ambulatory Visit: Payer: Self-pay | Admitting: Physician Assistant

## 2023-09-14 DIAGNOSIS — M25532 Pain in left wrist: Secondary | ICD-10-CM | POA: Diagnosis not present

## 2023-09-14 DIAGNOSIS — M5030 Other cervical disc degeneration, unspecified cervical region: Secondary | ICD-10-CM

## 2023-09-14 DIAGNOSIS — M5136 Other intervertebral disc degeneration, lumbar region with discogenic back pain only: Secondary | ICD-10-CM

## 2023-09-15 ENCOUNTER — Encounter: Payer: Self-pay | Admitting: Pulmonary Disease

## 2023-09-15 ENCOUNTER — Ambulatory Visit: Admitting: Pulmonary Disease

## 2023-09-15 VITALS — BP 129/54 | HR 65 | Ht 65.0 in | Wt 208.0 lb

## 2023-09-15 DIAGNOSIS — R918 Other nonspecific abnormal finding of lung field: Secondary | ICD-10-CM | POA: Diagnosis not present

## 2023-09-15 DIAGNOSIS — R5383 Other fatigue: Secondary | ICD-10-CM | POA: Diagnosis not present

## 2023-09-15 DIAGNOSIS — J4520 Mild intermittent asthma, uncomplicated: Secondary | ICD-10-CM | POA: Diagnosis not present

## 2023-09-15 LAB — SEDIMENTATION RATE: Sed Rate: 10 mm/h (ref 0–30)

## 2023-09-15 LAB — C-REACTIVE PROTEIN: CRP: <1 mg/dL (ref 0.5–20.0)

## 2023-09-15 MED ORDER — FLUTICASONE-SALMETEROL 250-50 MCG/ACT IN AEPB: 1.0000 | INHALATION_SPRAY | Freq: Two times a day (BID) | RESPIRATORY_TRACT | 11 refills | Status: DC
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
## 2023-09-15 NOTE — Patient Instructions (Addendum)
 Start advair inhaler 1 puffs twice daily - rinse mouth out after each use  Use albuterol  inhaler as needed  Recommend wearing mask, preferably N95 when working around your birds  Avoid smoke/inhalation exposures  We will schedule you for breathing tests  We will check labs today  We will schedule you for home sleep study  Follow up in 2 months to review breathing tests.

## 2023-09-16 MED ORDER — CELECOXIB 100 MG PO CAPS: 100.0000 mg | ORAL_CAPSULE | Freq: Two times a day (BID) | ORAL | 0 refills | Status: DC | PRN

## 2023-09-16 NOTE — Telephone Encounter (Signed)
**Note De-identified  [place] Obfuscation** Please advise 

## 2023-09-16 NOTE — Addendum Note (Signed)
 Addended by: Aubrii Sharpless D on: 09/16/2023 11:36 AM   Modules accepted: Orders

## 2023-09-19 ENCOUNTER — Encounter: Payer: Self-pay | Admitting: Pulmonary Disease

## 2023-09-19 DIAGNOSIS — S52592D Other fractures of lower end of left radius, subsequent encounter for closed fracture with routine healing: Secondary | ICD-10-CM | POA: Diagnosis not present

## 2023-09-19 DIAGNOSIS — S63502D Unspecified sprain of left wrist, subsequent encounter: Secondary | ICD-10-CM | POA: Diagnosis not present

## 2023-09-19 DIAGNOSIS — M25532 Pain in left wrist: Secondary | ICD-10-CM | POA: Diagnosis not present

## 2023-09-19 LAB — HYPERSENSITIVITY PNEUMONITIS
A. Pullulans Abs: NEGATIVE
A.Fumigatus #1 Abs: NEGATIVE
Micropolyspora faeni, IgG: NEGATIVE
Pigeon Serum Abs: NEGATIVE
Thermoact. Saccharii: NEGATIVE
Thermoactinomyces vulgaris, IgG: NEGATIVE

## 2023-09-20 NOTE — Telephone Encounter (Signed)
**Note De-identified  [place] Obfuscation** Please advise 

## 2023-09-22 ENCOUNTER — Encounter: Payer: Self-pay | Admitting: Pulmonary Disease

## 2023-09-22 DIAGNOSIS — M25532 Pain in left wrist: Secondary | ICD-10-CM | POA: Diagnosis not present

## 2023-09-22 NOTE — Telephone Encounter (Signed)
 Dr. Kara, please see mychart msg from the pt. I did advise seek emergency care since she mentioned she fears her PE may be back.   As mentioned,  I was Dx in Feb with a PE, after being misdiagnosed and unsuccessfully treated several times for pneumonia.    I was on apixaban  for 1 mo, pradaxa  for 2 mo. Within 2 days of stopping the blood thinners, I became profoundly exhausted and have stayed that way.    Should we be looking at CTEPH/PPES/other post-PE conditions? Is there value in re-running a d dimer to see if there are other clots despite the CT not showing any?    I know my body. 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  And I feel much more like I did just before the PE Dx than I did when I had sleep apnea because I was fat.    I am very concerned about that.

## 2023-09-26 ENCOUNTER — Other Ambulatory Visit: Payer: Self-pay | Admitting: Physician Assistant

## 2023-09-26 DIAGNOSIS — S32010S Wedge compression fracture of first lumbar vertebra, sequela: Secondary | ICD-10-CM

## 2023-09-26 DIAGNOSIS — Z13820 Encounter for screening for osteoporosis: Secondary | ICD-10-CM

## 2023-09-26 DIAGNOSIS — S32030S Wedge compression fracture of third lumbar vertebra, sequela: Secondary | ICD-10-CM

## 2023-09-27 DIAGNOSIS — F332 Major depressive disorder, recurrent severe without psychotic features: Secondary | ICD-10-CM | POA: Diagnosis not present

## 2023-09-27 DIAGNOSIS — F4312 Post-traumatic stress disorder, chronic: Secondary | ICD-10-CM | POA: Diagnosis not present

## 2023-09-27 DIAGNOSIS — F900 Attention-deficit hyperactivity disorder, predominantly inattentive type: Secondary | ICD-10-CM | POA: Diagnosis not present

## 2023-09-27 NOTE — Telephone Encounter (Signed)
**Note De-identified  [place] Obfuscation** Please advise 

## 2023-09-28 ENCOUNTER — Ambulatory Visit: Admitting: Pulmonary Disease

## 2023-09-28 ENCOUNTER — Encounter: Payer: Self-pay | Admitting: Pulmonary Disease

## 2023-09-28 VITALS — BP 135/82 | HR 96 | Ht 67.0 in | Wt 207.6 lb

## 2023-09-28 DIAGNOSIS — R6889 Other general symptoms and signs: Secondary | ICD-10-CM

## 2023-09-28 DIAGNOSIS — Z86711 Personal history of pulmonary embolism: Secondary | ICD-10-CM | POA: Diagnosis not present

## 2023-09-28 DIAGNOSIS — R5383 Other fatigue: Secondary | ICD-10-CM

## 2023-09-28 DIAGNOSIS — J4520 Mild intermittent asthma, uncomplicated: Secondary | ICD-10-CM

## 2023-09-28 DIAGNOSIS — R918 Other nonspecific abnormal finding of lung field: Secondary | ICD-10-CM

## 2023-09-28 LAB — D-DIMER, QUANTITATIVE: D-Dimer, Quant: 0.24 ug{FEU}/mL (ref <0.50)

## 2023-09-28 NOTE — Patient Instructions (Addendum)
 Stop advair inhaler due to hoarseness side effects  We will check CBC, CMP and D-Dimer today  If D-dimer is elevated, we will check a CTA Chest scan  We will follow up pulmonary function tests that are scheduled for 8/1
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
## 2023-09-29 ENCOUNTER — Ambulatory Visit: Payer: Self-pay | Admitting: Pulmonary Disease

## 2023-09-29 DIAGNOSIS — M25532 Pain in left wrist: Secondary | ICD-10-CM | POA: Diagnosis not present

## 2023-09-29 LAB — COMPREHENSIVE METABOLIC PANEL WITH GFR
ALT: 21 U/L (ref 0–35)
AST: 26 U/L (ref 0–37)
Albumin: 4.6 g/dL (ref 3.5–5.2)
Alkaline Phosphatase: 89 U/L (ref 39–117)
BUN: 14 mg/dL (ref 6–23)
CO2: 27 meq/L (ref 19–32)
Calcium: 9.8 mg/dL (ref 8.4–10.5)
Chloride: 107 meq/L (ref 96–112)
Creatinine, Ser: 0.74 mg/dL (ref 0.40–1.20)
GFR: 92.24 mL/min (ref >60.00)
Glucose, Bld: 74 mg/dL (ref 70–99)
Potassium: 4.6 meq/L (ref 3.5–5.1)
Sodium: 140 meq/L (ref 135–145)
Total Bilirubin: 0.7 mg/dL (ref 0.2–1.2)
Total Protein: 7.3 g/dL (ref 6.0–8.3)

## 2023-09-29 LAB — CBC WITH DIFFERENTIAL/PLATELET
Basophils Absolute: 0 K/uL (ref 0.0–0.1)
Basophils Relative: 0.6 % (ref 0.0–3.0)
Eosinophils Absolute: 0.1 K/uL (ref 0.0–0.7)
Eosinophils Relative: 2.5 % (ref 0.0–5.0)
HCT: 41.7 % (ref 36.0–46.0)
Hemoglobin: 13.8 g/dL (ref 12.0–15.0)
Lymphocytes Relative: 25 % (ref 12.0–46.0)
Lymphs Abs: 1.2 K/uL (ref 0.7–4.0)
MCHC: 33.2 g/dL (ref 30.0–36.0)
MCV: 90.9 fl (ref 78.0–100.0)
Monocytes Absolute: 0.4 K/uL (ref 0.1–1.0)
Monocytes Relative: 8.3 % (ref 3.0–12.0)
Neutro Abs: 3.1 K/uL (ref 1.4–7.7)
Neutrophils Relative %: 63.6 % (ref 43.0–77.0)
Platelets: 215 K/uL (ref 150.0–400.0)
RBC: 4.59 Mil/uL (ref 3.87–5.11)
RDW: 13.8 % (ref 11.5–15.5)
WBC: 4.8 K/uL (ref 4.0–10.5)

## 2023-09-29 NOTE — Telephone Encounter (Signed)
 Can you please advise?

## 2023-09-30 ENCOUNTER — Ambulatory Visit: Payer: Self-pay

## 2023-09-30 ENCOUNTER — Ambulatory Visit: Admission: EM | Admit: 2023-09-30 | Discharge: 2023-09-30 | Disposition: A | Discharge status: Home / Self Care

## 2023-09-30 DIAGNOSIS — R059 Cough, unspecified: Secondary | ICD-10-CM

## 2023-09-30 DIAGNOSIS — R0602 Shortness of breath: Secondary | ICD-10-CM

## 2023-09-30 HISTORY — DX: Other pulmonary embolism without acute cor pulmonale: I26.99

## 2023-09-30 HISTORY — DX: Type 2 diabetes mellitus without complications: E11.9

## 2023-09-30 LAB — POC SARS CORONAVIRUS 2 AG -  ED: SARS Coronavirus 2 Ag: NEGATIVE

## 2023-09-30 LAB — POCT INFLUENZA A/B
Influenza A, POC: NEGATIVE
Influenza B, POC: NEGATIVE

## 2023-09-30 MED ORDER — IPRATROPIUM-ALBUTEROL 0.5-2.5 (3) MG/3ML IN SOLN
3.0000 mL | RESPIRATORY_TRACT | Status: AC
  Administered 2023-09-30: 3 mL via RESPIRATORY_TRACT

## 2023-09-30 MED ORDER — METHYLPREDNISOLONE ACETATE 80 MG/ML IJ SUSP
80.0000 mg | Freq: Once | INTRAMUSCULAR | Status: AC
  Administered 2023-09-30: 80 mg via INTRAMUSCULAR

## 2023-09-30 MED ORDER — DOXYCYCLINE HYCLATE 100 MG PO CAPS: 100.0000 mg | ORAL_CAPSULE | Freq: Two times a day (BID) | ORAL | 0 refills | Status: AC

## 2023-09-30 MED ORDER — PREDNISONE 20 MG PO TABS: ORAL_TABLET | ORAL | 0 refills | Status: DC

## 2023-09-30 NOTE — ED Triage Notes (Signed)
 [facility], coughing, chills, lymphoid swelling in groin, sore throat for 3 days.   Pt reports pmh of pulmonary embolism in feb. Pt reports she has been taken off of the blood thinners in may and since then she has felt fatigued she is being followed by a pulmonologist.
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
## 2023-09-30 NOTE — Telephone Encounter (Signed)
 Patient advised to go to the urgent care.

## 2023-09-30 NOTE — Discharge Instructions (Addendum)
 Advised patient COVID-19 and Influenza A/B- today.  Advised patient to take medication as directed with food to completion.  Advised patient to take prednisone  with first dose of doxycycline  for the next 5 of 7 days.  Advised patient to begin prednisone  tomorrow Saturday, 10/01/2023.  Encouraged increase in daily water intake to 64 ounces per day.  Advised if symptoms worsen and/or unresolved please follow-up with your PCP or pulmonology for further evaluation.

## 2023-09-30 NOTE — Telephone Encounter (Signed)
 Patient refused to speak with nurse triage, stating she only wants to speak with someone in the office. PAS will connect the patient with the office.     Copied from CRM [id number]. Topic: Clinical - Red Word Triage >> Sep 30, 2023  9:59 AM Julie Johnston wrote: Red Word that prompted transfer to Nurse Triage: Patient states she has seen a pulmonologist in the past two weeks. States she now has a productive cough & sore throat. Stated she feels like she has a fever but doesn't and she took an expired home covid test that showed negative. Wants to be seen today but there was no availability until Monday for anyone in the office. Patient states she doesn't know what she needs to do.

## 2023-10-03 ENCOUNTER — Ambulatory Visit (INDEPENDENT_AMBULATORY_CARE_PROVIDER_SITE_OTHER): Admitting: Physician Assistant

## 2023-10-03 ENCOUNTER — Encounter: Payer: Self-pay | Admitting: Physician Assistant

## 2023-10-03 ENCOUNTER — Telehealth: Payer: Self-pay

## 2023-10-03 VITALS — BP 134/70 | HR 102 | Temp 98.6°F | Ht 67.0 in | Wt 206.0 lb

## 2023-10-03 DIAGNOSIS — J22 Unspecified acute lower respiratory infection: Secondary | ICD-10-CM

## 2023-10-03 DIAGNOSIS — J45901 Unspecified asthma with (acute) exacerbation: Secondary | ICD-10-CM

## 2023-10-03 DIAGNOSIS — B379 Candidiasis, unspecified: Secondary | ICD-10-CM | POA: Diagnosis not present

## 2023-10-03 DIAGNOSIS — R918 Other nonspecific abnormal finding of lung field: Secondary | ICD-10-CM

## 2023-10-03 DIAGNOSIS — Z86711 Personal history of pulmonary embolism: Secondary | ICD-10-CM

## 2023-10-03 DIAGNOSIS — T3695XA Adverse effect of unspecified systemic antibiotic, initial encounter: Secondary | ICD-10-CM

## 2023-10-03 MED ORDER — IPRATROPIUM-ALBUTEROL 0.5-2.5 (3) MG/3ML IN SOLN
3.0000 mL | Freq: Once | RESPIRATORY_TRACT | Status: AC
Start: 2023-10-03 — End: ?

## 2023-10-03 MED ORDER — BUDESONIDE-FORMOTEROL FUMARATE 160-4.5 MCG/ACT IN AERO: 2.0000 | INHALATION_SPRAY | Freq: Two times a day (BID) | RESPIRATORY_TRACT | 1 refills | Status: DC

## 2023-10-03 MED ORDER — FLUCONAZOLE 150 MG PO TABS: ORAL_TABLET | ORAL | 0 refills | Status: DC

## 2023-10-03 MED ORDER — IPRATROPIUM-ALBUTEROL 0.5-2.5 (3) MG/3ML IN SOLN: 3.0000 mL | RESPIRATORY_TRACT | 0 refills | Status: DC | PRN

## 2023-10-03 NOTE — Telephone Encounter (Signed)
 Dme supply sent to [facility] using [facility]ute  [facility] NPI [id number] [address], [place], [place] [address] Order Information: Order ID: [id number]: Nebulizer Compressor for Reusable Package with Mouthpiece Only Quantity: 1, Start Date: 10/03/2023, Length of Need: 99 months, Medication: albuterol  ipratropium (Duoneb, Combivent), Dosage Instructions: DuoNeb is one 3 mL vial administered 4 times per day via nebulization with up to 2 additional 3 mL doses allowed per day, if needed. [id number]: Nebulizer Set, Reusable Quantity: 1, Start Date: 10/03/2023, Length of Need: 99 months Mouthpiece Only, N/A Catalog ID: [id number] Quantity: 1, Start Date: 10/03/2023, Length of Need: 99 months [id number]: Disposable Nebulizer Compressor Filter Quantity: 1, Start Date: 10/03/2023, Length of Need: 99 months [id number]: Nebulizer Set, Disposable Quantity: 1, Start Date: 10/03/2023, Length of Need: 99 months

## 2023-10-03 NOTE — Progress Notes (Unsigned)
duo

## 2023-10-03 NOTE — Progress Notes (Unsigned)
 Established Patient Office Visit  Subjective   Patient ID: Julie Johnston, female    DOB: June 02, 1970  Age: 53 y.o. MRN: [id number]   HPI Pt is a 53 yo female with recurrent upper respiratory infections and coughing since February. She has been referred to pulmonologist and he placed her on Wixella but caused patient to lose her voice and not tolerate. She felt like she did better on Trelegy but he did not put her on any other inhalers yet. He is not exactly sure what dx is but has documented interstitial lung abnormality. Her cough and congestion worsened so bad that she went to UC on 09/30/23 and was given steroid and doxycycline . She does not feel like she is getting better. She is using albuterol  inhaler some and it does help but causes her heart to race. She is frustrated and does not know what else to do. She does not feel like pulmonologist is helping her and would like to be referred to [facility] at [facility].   She has hx of abx induced yeast infections and needs diflucan  sent to pharmacy.   ROS See HPI.    Objective:     BP 134/70   Pulse (!) 102   Temp 98.6 F (37 C) (Oral)   Ht 5' 7 (1.702 m)   Wt 206 lb (93.4 kg)   SpO2 100%   BMI 32.26 kg/m  BP Readings from Last 3 Encounters:  10/03/23 134/70  09/30/23 113/75  09/28/23 135/82   Wt Readings from Last 3 Encounters:  10/03/23 206 lb (93.4 kg)  09/28/23 207 lb 9.6 oz (94.2 kg)  09/15/23 208 lb (94.3 kg)      Physical Exam    Assessment & Plan:  Julie Johnston was seen today for hospitalization follow-up.  Diagnoses and all orders for this visit:  Recurrent lower respiratory tract infection -     ipratropium-albuterol  (DUONEB) 0.5-2.5 (3) MG/3ML nebulizer solution 3 mL -     budesonide -formoterol  (SYMBICORT ) 160-4.5 MCG/ACT inhaler; Inhale 2 puffs into the lungs 2 (two) times daily. -     ipratropium-albuterol  (DUONEB) 0.5-2.5 (3) MG/3ML SOLN; Take 3 mLs by nebulization every 2 (two) hours as  needed (wheeze, SOB). -     Ambulatory referral to Pulmonology  Interstitial lung abnormality (ILA) -     ipratropium-albuterol  (DUONEB) 0.5-2.5 (3) MG/3ML nebulizer solution 3 mL -     budesonide -formoterol  (SYMBICORT ) 160-4.5 MCG/ACT inhaler; Inhale 2 puffs into the lungs 2 (two) times daily. -     ipratropium-albuterol  (DUONEB) 0.5-2.5 (3) MG/3ML SOLN; Take 3 mLs by nebulization every 2 (two) hours as needed (wheeze, SOB). -     Ambulatory referral to Pulmonology  History of pulmonary embolism -     Ambulatory referral to Pulmonology  Reactive airway disease with acute exacerbation, unspecified asthma severity, unspecified whether persistent -     ipratropium-albuterol  (DUONEB) 0.5-2.5 (3) MG/3ML nebulizer solution 3 mL -     budesonide -formoterol  (SYMBICORT ) 160-4.5 MCG/ACT inhaler; Inhale 2 puffs into the lungs 2 (two) times daily. -     ipratropium-albuterol  (DUONEB) 0.5-2.5 (3) MG/3ML SOLN; Take 3 mLs by nebulization every 2 (two) hours as needed (wheeze, SOB). -     Ambulatory referral to Pulmonology  Antibiotic-induced yeast infection -     fluconazole  (DIFLUCAN ) 150 MG tablet; Take one tablet now and then repeat after finish antibiotic.  Nebulizer given in office today and does seem to give patient relief Ordered nebulizer for at home use Duoneb solution  sent to pharmacy to use every 2-6hours Sent symbicort  to try to replace wixella: 2 puffs twice a day Continue with doxycycline  and prednisone  Consider mucinex regular 600mg  twice a day to help with mucus production Sent diflucan  for abx induced yeast infection Referral made to new pulmonologist    Vermell Bologna, PA-C

## 2023-10-03 NOTE — Patient Instructions (Signed)
 Nebulizer use every 2-6 hours as needed for cough/shortness of breath Finish doxycycline  and prednisone  Start symbicort  2 puffs twice a day Referral to pulmonology with [facility]

## 2023-10-04 ENCOUNTER — Ambulatory Visit

## 2023-10-04 ENCOUNTER — Ambulatory Visit: Payer: Self-pay | Admitting: Physician Assistant

## 2023-10-04 ENCOUNTER — Encounter: Payer: Self-pay | Admitting: Physician Assistant

## 2023-10-04 DIAGNOSIS — J45901 Unspecified asthma with (acute) exacerbation: Secondary | ICD-10-CM | POA: Diagnosis not present

## 2023-10-04 DIAGNOSIS — J22 Unspecified acute lower respiratory infection: Secondary | ICD-10-CM

## 2023-10-04 DIAGNOSIS — R059 Cough, unspecified: Secondary | ICD-10-CM | POA: Diagnosis not present

## 2023-10-04 DIAGNOSIS — R918 Other nonspecific abnormal finding of lung field: Secondary | ICD-10-CM

## 2023-10-04 DIAGNOSIS — R509 Fever, unspecified: Secondary | ICD-10-CM | POA: Diagnosis not present

## 2023-10-04 NOTE — Progress Notes (Signed)
Normal CXR. No acute findings.

## 2023-10-05 ENCOUNTER — Encounter: Payer: Self-pay | Admitting: Physician Assistant

## 2023-10-06 DIAGNOSIS — J22 Unspecified acute lower respiratory infection: Secondary | ICD-10-CM | POA: Diagnosis not present

## 2023-10-06 DIAGNOSIS — J45901 Unspecified asthma with (acute) exacerbation: Secondary | ICD-10-CM | POA: Diagnosis not present

## 2023-10-06 DIAGNOSIS — Z86711 Personal history of pulmonary embolism: Secondary | ICD-10-CM | POA: Diagnosis not present

## 2023-10-06 NOTE — Telephone Encounter (Signed)
 Reached out again to patient regarding PAP application for Aimovig  and she has been dealing with a lot of sickness and still has the application to complete and will return soon.

## 2023-10-10 ENCOUNTER — Other Ambulatory Visit (HOSPITAL_BASED_OUTPATIENT_CLINIC_OR_DEPARTMENT_OTHER)
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
## 2023-10-17 ENCOUNTER — Ambulatory Visit: Payer: Medicare HMO | Admitting: Neurology

## 2023-10-19 ENCOUNTER — Other Ambulatory Visit: Payer: Self-pay | Admitting: Neurology

## 2023-10-21 ENCOUNTER — Ambulatory Visit (INDEPENDENT_AMBULATORY_CARE_PROVIDER_SITE_OTHER): Admitting: Pulmonary Disease

## 2023-10-21 ENCOUNTER — Encounter

## 2023-10-21 DIAGNOSIS — R918 Other nonspecific abnormal finding of lung field: Secondary | ICD-10-CM

## 2023-10-21 LAB — PULMONARY FUNCTION TEST
DL/VA % pred: 125 %
DL/VA: 5.24 ml/min/mmHg/L
DLCO cor % pred: 116 %
DLCO cor: 26.69 ml/min/mmHg
DLCO unc % pred: 117 %
DLCO unc: 27.01 ml/min/mmHg
FEF 25-75 Post: 4.33 L/s
FEF 25-75 Pre: 3.9 L/s
FEF2575-%Change-Post: 11 %
FEF2575-%Pred-Post: 153 %
FEF2575-%Pred-Pre: 138 %
FEV1-%Change-Post: 4 %
FEV1-%Pred-Post: 98 %
FEV1-%Pred-Pre: 94 %
FEV1-Post: 2.98 L
FEV1-Pre: 2.86 L
FEV1FVC-%Change-Post: 0 %
FEV1FVC-%Pred-Pre: 108 %
FEV6-%Change-Post: 4 %
FEV6-%Pred-Post: 92 %
FEV6-%Pred-Pre: 88 %
FEV6-Post: 3.48 L
FEV6-Pre: 3.32 L
FEV6FVC-%Change-Post: 0 %
FEV6FVC-%Pred-Post: 102 %
FEV6FVC-%Pred-Pre: 102 %
FVC-%Change-Post: 4 %
FVC-%Pred-Post: 90 %
FVC-%Pred-Pre: 86 %
FVC-Post: 3.48 L
FVC-Pre: 3.32 L
Post FEV1/FVC ratio: 86 %
Post FEV6/FVC ratio: 100 %
Pre FEV1/FVC ratio: 86 %
Pre FEV6/FVC Ratio: 100 %
RV % pred: 100 %
RV: 2 L
TLC % pred: 102 %
TLC: 5.65 L

## 2023-10-21 NOTE — Progress Notes (Signed)
 Full PFT performed today.

## 2023-10-21 NOTE — Patient Instructions (Signed)
 Full PFT performed today.

## 2023-10-23 ENCOUNTER — Ambulatory Visit: Payer: Self-pay | Admitting: Pulmonary Disease

## 2023-10-24 DIAGNOSIS — F900 Attention-deficit hyperactivity disorder, predominantly inattentive type: Secondary | ICD-10-CM | POA: Diagnosis not present

## 2023-10-24 DIAGNOSIS — F332 Major depressive disorder, recurrent severe without psychotic features: Secondary | ICD-10-CM | POA: Diagnosis not present

## 2023-10-24 DIAGNOSIS — F4312 Post-traumatic stress disorder, chronic: Secondary | ICD-10-CM | POA: Diagnosis not present

## 2023-10-27 ENCOUNTER — Encounter: Payer: Self-pay | Admitting: Pulmonary Disease

## 2023-10-27 DIAGNOSIS — J410 Simple chronic bronchitis: Secondary | ICD-10-CM

## 2023-10-27 DIAGNOSIS — R918 Other nonspecific abnormal finding of lung field: Secondary | ICD-10-CM

## 2023-10-27 DIAGNOSIS — J4520 Mild intermittent asthma, uncomplicated: Secondary | ICD-10-CM

## 2023-10-27 DIAGNOSIS — R0602 Shortness of breath: Secondary | ICD-10-CM

## 2023-10-28 NOTE — Telephone Encounter (Signed)
Please advise on your return.

## 2023-10-31 NOTE — Addendum Note (Signed)
 Addended by: Makia Bossi on: 10/31/2023 02:27 PM   Modules accepted: Orders

## 2023-11-01 ENCOUNTER — Other Ambulatory Visit: Payer: Self-pay | Admitting: Family Medicine

## 2023-11-01 DIAGNOSIS — M5030 Other cervical disc degeneration, unspecified cervical region: Secondary | ICD-10-CM

## 2023-11-01 DIAGNOSIS — M5136 Other intervertebral disc degeneration, lumbar region with discogenic back pain only: Secondary | ICD-10-CM

## 2023-11-01 MED ORDER — UMECLIDINIUM-VILANTEROL 62.5-25 MCG/ACT IN AEPB: 1.0000 | INHALATION_SPRAY | Freq: Every day | RESPIRATORY_TRACT | 3 refills | Status: DC

## 2023-11-01 NOTE — Addendum Note (Signed)
 Addended by: Teniya Filter on: 11/01/2023 06:41 PM   Modules accepted: Orders

## 2023-11-01 NOTE — Telephone Encounter (Signed)
**Note De-identified  [place] Obfuscation** Please advise 

## 2023-11-03 NOTE — Addendum Note (Signed)
 Addended by: CLAUDENE NEVINS A on: 11/03/2023 03:24 PM   Modules accepted: Orders

## 2023-11-07 ENCOUNTER — Encounter: Payer: Self-pay | Admitting: Physician Assistant

## 2023-11-07 MED ORDER — CELECOXIB 200 MG PO CAPS: ORAL_CAPSULE | ORAL | 1 refills | Status: AC

## 2023-11-14 ENCOUNTER — Ambulatory Visit

## 2023-11-14 ENCOUNTER — Other Ambulatory Visit (HOSPITAL_BASED_OUTPATIENT_CLINIC_OR_DEPARTMENT_OTHER)

## 2023-11-14 DIAGNOSIS — J189 Pneumonia, unspecified organism: Secondary | ICD-10-CM | POA: Diagnosis not present

## 2023-11-14 DIAGNOSIS — J849 Interstitial pulmonary disease, unspecified: Secondary | ICD-10-CM

## 2023-11-14 DIAGNOSIS — R918 Other nonspecific abnormal finding of lung field: Secondary | ICD-10-CM | POA: Diagnosis not present

## 2023-11-14 DIAGNOSIS — J984 Other disorders of lung: Secondary | ICD-10-CM | POA: Diagnosis not present

## 2023-11-16 NOTE — Telephone Encounter (Signed)
 Multiple attempts made to receive the PAP Application back from patient for Aimovig . No results yet. Notified Pharm D as well.

## 2023-11-17 ENCOUNTER — Encounter: Payer: Self-pay | Admitting: Physician Assistant

## 2023-11-22 DIAGNOSIS — M5416 Radiculopathy, lumbar region: Secondary | ICD-10-CM | POA: Diagnosis not present

## 2023-11-22 DIAGNOSIS — M47816 Spondylosis without myelopathy or radiculopathy, lumbar region: Secondary | ICD-10-CM | POA: Diagnosis not present

## 2023-11-22 DIAGNOSIS — M810 Age-related osteoporosis without current pathological fracture: Secondary | ICD-10-CM | POA: Diagnosis not present

## 2023-11-22 DIAGNOSIS — S3210XD Unspecified fracture of sacrum, subsequent encounter for fracture with routine healing: Secondary | ICD-10-CM | POA: Diagnosis not present

## 2023-11-22 DIAGNOSIS — M5136 Other intervertebral disc degeneration, lumbar region with discogenic back pain only: Secondary | ICD-10-CM | POA: Diagnosis not present

## 2023-11-25 ENCOUNTER — Ambulatory Visit: Payer: Self-pay | Admitting: Pulmonary Disease

## 2023-11-28 ENCOUNTER — Other Ambulatory Visit: Payer: Self-pay | Admitting: Physician Assistant

## 2023-11-28 ENCOUNTER — Other Ambulatory Visit: Payer: Self-pay

## 2023-11-28 DIAGNOSIS — J22 Unspecified acute lower respiratory infection: Secondary | ICD-10-CM

## 2023-11-28 DIAGNOSIS — J45901 Unspecified asthma with (acute) exacerbation: Secondary | ICD-10-CM

## 2023-11-28 DIAGNOSIS — R918 Other nonspecific abnormal finding of lung field: Secondary | ICD-10-CM

## 2023-11-28 MED ORDER — AMBULATORY NON FORMULARY MEDICATION: 0 refills | Status: AC

## 2023-11-28 NOTE — Telephone Encounter (Signed)
 Copied from CRM [id number]. Topic: Clinical - Medication Question >> Nov 28, 2023  2:55 PM Graeme ORN wrote: Reason for CRM: Patient called for Covid vaccine. State she has been sick with pulmonary issues and needs to get it soon. Would also like flu. Says it would have to be sent to [facility] [place]. Thank You   ----------------------------------------------------------------------- From previous Reason for Contact - Prescription Issue: Reason for CRM:

## 2023-11-28 NOTE — Telephone Encounter (Signed)
 Pended prescription  Was not sure what dx code you wanted to use.   We will not have to send the rx for a flu shot right?

## 2023-11-30 ENCOUNTER — Telehealth: Payer: Self-pay | Admitting: Pulmonary Disease

## 2023-11-30 NOTE — Telephone Encounter (Signed)
 Julie Johnston with [employer] called into the office stating Julie Johnston didn't understand why the doctor order a home sleep study. She has worked in the [employer] and doesn't feel like the home sleep study is as good as an in lab sleep study.

## 2023-11-30 NOTE — Telephone Encounter (Signed)
 Insurance companies don't typically cover in-lab sleep studies as the first test. That's why we start with home test first.  Dorn Chill, MD [facility] Office: [phone]   See Amion for personal pager PCCM on call pager [phone] until 7pm. Please call Elink 7p-7a. [phone]

## 2023-12-01 DIAGNOSIS — M48061 Spinal stenosis, lumbar region without neurogenic claudication: Secondary | ICD-10-CM | POA: Diagnosis not present

## 2023-12-01 DIAGNOSIS — M4727 Other spondylosis with radiculopathy, lumbosacral region: Secondary | ICD-10-CM | POA: Diagnosis not present

## 2023-12-01 DIAGNOSIS — M4807 Spinal stenosis, lumbosacral region: Secondary | ICD-10-CM | POA: Diagnosis not present

## 2023-12-01 DIAGNOSIS — M438X6 Other specified deforming dorsopathies, lumbar region: Secondary | ICD-10-CM | POA: Diagnosis not present

## 2023-12-01 DIAGNOSIS — M4726 Other spondylosis with radiculopathy, lumbar region: Secondary | ICD-10-CM | POA: Diagnosis not present

## 2023-12-01 DIAGNOSIS — M4725 Other spondylosis with radiculopathy, thoracolumbar region: Secondary | ICD-10-CM | POA: Diagnosis not present

## 2023-12-02 DIAGNOSIS — Z23 Encounter for immunization: Secondary | ICD-10-CM | POA: Diagnosis not present

## 2023-12-05 DIAGNOSIS — Z9884 Bariatric surgery status: Secondary | ICD-10-CM | POA: Diagnosis not present

## 2023-12-05 DIAGNOSIS — Z8781 Personal history of (healed) traumatic fracture: Secondary | ICD-10-CM | POA: Diagnosis not present

## 2023-12-05 DIAGNOSIS — Z79899 Other long term (current) drug therapy: Secondary | ICD-10-CM | POA: Diagnosis not present

## 2023-12-05 DIAGNOSIS — M818 Other osteoporosis without current pathological fracture: Secondary | ICD-10-CM | POA: Diagnosis not present

## 2023-12-06 DIAGNOSIS — R0602 Shortness of breath: Secondary | ICD-10-CM | POA: Diagnosis not present

## 2023-12-06 DIAGNOSIS — G4733 Obstructive sleep apnea (adult) (pediatric): Secondary | ICD-10-CM | POA: Diagnosis not present

## 2023-12-06 DIAGNOSIS — I2699 Other pulmonary embolism without acute cor pulmonale: Secondary | ICD-10-CM | POA: Diagnosis not present

## 2023-12-16 ENCOUNTER — Encounter: Payer: Self-pay | Admitting: Physician Assistant

## 2023-12-20 NOTE — Telephone Encounter (Signed)
Printed and placed in your in basket

## 2023-12-23 DIAGNOSIS — R0602 Shortness of breath: Secondary | ICD-10-CM | POA: Diagnosis not present

## 2023-12-23 NOTE — Telephone Encounter (Signed)
 Received patient portion PAP application [employer] Aimovig  will re-fax provider portionfor review and signature.

## 2023-12-26 ENCOUNTER — Telehealth: Payer: Self-pay | Admitting: Neurology

## 2023-12-26 DIAGNOSIS — F332 Major depressive disorder, recurrent severe without psychotic features: Secondary | ICD-10-CM | POA: Diagnosis not present

## 2023-12-26 DIAGNOSIS — F4312 Post-traumatic stress disorder, chronic: Secondary | ICD-10-CM | POA: Diagnosis not present

## 2023-12-26 DIAGNOSIS — F900 Attention-deficit hyperactivity disorder, predominantly inattentive type: Secondary | ICD-10-CM | POA: Diagnosis not present

## 2023-12-26 NOTE — Telephone Encounter (Signed)
 Julie Johnston  called as PT has not rcvd update on Pt assistance, needs update

## 2023-12-26 NOTE — Progress Notes (Signed)
 Julie Johnston                                          MRN: [id number]   12/26/2023   The [facility] Quality Team Specialist reviewed this patient medical record for the purposes of chart review for care gap closure. The following were reviewed: chart review for care gap closure-kidney health evaluation for diabetes:eGFR  and uACR.    [facility] Quality Team

## 2023-12-27 DIAGNOSIS — R0602 Shortness of breath: Secondary | ICD-10-CM | POA: Diagnosis not present

## 2023-12-27 NOTE — Telephone Encounter (Signed)
Forms faxed to company

## 2023-12-28 ENCOUNTER — Other Ambulatory Visit: Payer: Self-pay | Admitting: Physician Assistant

## 2023-12-28 DIAGNOSIS — J45901 Unspecified asthma with (acute) exacerbation: Secondary | ICD-10-CM

## 2023-12-28 DIAGNOSIS — R918 Other nonspecific abnormal finding of lung field: Secondary | ICD-10-CM

## 2023-12-28 DIAGNOSIS — J22 Unspecified acute lower respiratory infection: Secondary | ICD-10-CM

## 2024-01-10 DIAGNOSIS — G4733 Obstructive sleep apnea (adult) (pediatric): Secondary | ICD-10-CM | POA: Diagnosis not present

## 2024-01-11 DIAGNOSIS — G4733 Obstructive sleep apnea (adult) (pediatric): Secondary | ICD-10-CM | POA: Diagnosis not present

## 2024-01-12 DIAGNOSIS — R0602 Shortness of breath: Secondary | ICD-10-CM | POA: Diagnosis not present

## 2024-02-13 ENCOUNTER — Encounter: Payer: Self-pay | Admitting: Neurology

## 2024-02-17 DIAGNOSIS — J029 Acute pharyngitis, unspecified: Secondary | ICD-10-CM | POA: Diagnosis not present

## 2024-02-17 DIAGNOSIS — J069 Acute upper respiratory infection, unspecified: Secondary | ICD-10-CM | POA: Diagnosis not present

## 2024-02-20 ENCOUNTER — Encounter: Payer: Self-pay | Admitting: Physician Assistant

## 2024-02-23 DIAGNOSIS — G894 Chronic pain syndrome: Secondary | ICD-10-CM | POA: Diagnosis not present

## 2024-02-23 DIAGNOSIS — L299 Pruritus, unspecified: Secondary | ICD-10-CM | POA: Diagnosis not present

## 2024-02-23 DIAGNOSIS — M542 Cervicalgia: Secondary | ICD-10-CM | POA: Diagnosis not present

## 2024-02-23 DIAGNOSIS — M47812 Spondylosis without myelopathy or radiculopathy, cervical region: Secondary | ICD-10-CM | POA: Diagnosis not present

## 2024-02-23 DIAGNOSIS — M47816 Spondylosis without myelopathy or radiculopathy, lumbar region: Secondary | ICD-10-CM | POA: Diagnosis not present

## 2024-03-02 NOTE — Progress Notes (Signed)
 Julie Johnston                                          MRN: [id number]   03/02/2024   The [facility] Quality Team Specialist reviewed this patient medical record for the purposes of chart review for care gap closure. The following were reviewed: chart review for care gap closure-kidney health evaluation for diabetes:eGFR  and uACR.    [facility] Quality Team

## 2024-03-06 DIAGNOSIS — F4310 Post-traumatic stress disorder, unspecified: Secondary | ICD-10-CM | POA: Diagnosis not present

## 2024-03-06 DIAGNOSIS — F909 Attention-deficit hyperactivity disorder, unspecified type: Secondary | ICD-10-CM | POA: Diagnosis not present

## 2024-03-06 DIAGNOSIS — F419 Anxiety disorder, unspecified: Secondary | ICD-10-CM | POA: Diagnosis not present

## 2024-03-06 DIAGNOSIS — F329 Major depressive disorder, single episode, unspecified: Secondary | ICD-10-CM | POA: Diagnosis not present

## 2024-03-30 NOTE — Progress Notes (Signed)
 Joetta Delprado                                          MRN: [id number]   03/30/2024   The [facility] Quality Team Specialist reviewed this patient medical record for the purposes of chart review for care gap closure. The following were reviewed: chart review for care gap closure-kidney health evaluation for diabetes:eGFR  and uACR.    [facility] Quality Team

## 2024-04-12 ENCOUNTER — Telehealth: Admitting: Family

## 2024-04-12 DIAGNOSIS — J4520 Mild intermittent asthma, uncomplicated: Secondary | ICD-10-CM | POA: Diagnosis not present

## 2024-04-12 DIAGNOSIS — J069 Acute upper respiratory infection, unspecified: Secondary | ICD-10-CM | POA: Diagnosis not present

## 2024-04-12 MED ORDER — AZITHROMYCIN 250 MG PO TABS: ORAL_TABLET | ORAL | 0 refills | Status: AC

## 2024-04-12 MED ORDER — BENZONATATE 100 MG PO CAPS: 100.0000 mg | ORAL_CAPSULE | Freq: Two times a day (BID) | ORAL | 0 refills | Status: AC | PRN

## 2024-04-12 MED ORDER — PREDNISONE 10 MG (21) PO TBPK: ORAL_TABLET | ORAL | 0 refills | Status: AC

## 2024-04-12 NOTE — Addendum Note (Signed)
 Addended by: LAVELL LYE A on: 04/12/2024 07:06 PM   Modules accepted: Orders

## 2024-04-12 NOTE — Progress Notes (Signed)
 We are sorry that you are not feeling well.  Here is how we plan to help!  Based on your presentation I believe you most likely have A cough due to a virus.  This is called viral bronchitis and is best treated by rest, plenty of fluids and control of the cough.  You may use Ibuprofen or Tylenol as directed to help your symptoms.     In addition you may use A non-prescription cough medication called Robitussin DAC. Take 2 teaspoons every 8 hours or Delsym: take 2 teaspoons every 12 hours., A non-prescription cough medication called Mucinex  DM: take 2 tablets every 12 hours., and A prescription cough medication called Tessalon  Perles 100mg . You may take 1-2 capsules every 8 hours as needed for your cough.  Prednisone  10 mg daily for 6 days (see taper instructions below)  Directions for 6 day taper: Day 1: 2 tablets before breakfast, 1 after both lunch & dinner and 2 at bedtime Day 2: 1 tab before breakfast, 1 after both lunch & dinner and 2 at bedtime Day 3: 1 tab at each meal & 1 at bedtime Day 4: 1 tab at breakfast, 1 at lunch, 1 at bedtime Day 5: 1 tab at breakfast & 1 tab at bedtime Day 6: 1 tab at breakfast  From your responses in the eVisit questionnaire you describe inflammation in the upper respiratory tract which is causing a significant cough.  This is commonly called Bronchitis and has four common causes:   Allergies Viral Infections Acid Reflux Bacterial Infection Allergies, viruses and acid reflux are treated by controlling symptoms or eliminating the cause. An example might be a cough caused by taking certain blood pressure medications. You stop the cough by changing the medication. Another example might be a cough caused by acid reflux. Controlling the reflux helps control the cough.  USE OF BRONCHODILATOR (RESCUE) INHALERS: There is a risk from using your bronchodilator too frequently.  The risk is that over-reliance on a medication which only relaxes the muscles surrounding  the breathing tubes can reduce the effectiveness of medications prescribed to reduce swelling and congestion of the tubes themselves.  Although you feel brief relief from the bronchodilator inhaler, your asthma may actually be worsening with the tubes becoming more swollen and filled with mucus.  This can delay other crucial treatments, such as oral steroid medications. If you need to use a bronchodilator inhaler daily, several times per day, you should discuss this with your provider.  There are probably better treatments that could be used to keep your asthma under control.     HOME CARE Only take medications as instructed by your medical team. Complete the entire course of an antibiotic. Drink plenty of fluids and get plenty of rest. Avoid close contacts especially the very young and the elderly Cover your mouth if you cough or cough into your sleeve. Always remember to wash your hands A steam or ultrasonic humidifier can help congestion.   GET HELP RIGHT AWAY IF: You develop worsening fever. You become short of breath You cough up blood. Your symptoms persist after you have completed your treatment plan MAKE SURE YOU  Understand these instructions. Will watch your condition. Will get help right away if you are not doing well or get worse.  Your e-visit answers were reviewed by a board certified advanced clinical practitioner to complete your personal care plan.  Depending on the condition, your plan could have included both over the counter or prescription medications. If there  is a problem please reply  once you have received a response from your provider. Your safety is important to us .  If you have drug allergies check your prescription carefully.    You can use MyChart to ask questions about today's visit, request a non-urgent call back, or ask for a work or school excuse for 24 hours related to this e-Visit. If it has been greater than 24 hours you will need to follow up with your  provider, or enter a new e-Visit to address those concerns. You will get an e-mail in the next two days asking about your experience.  I hope that your e-visit has been valuable and will speed your recovery. Thank you for using e-visits.   I have spent 5 minutes in review of e-visit questionnaire, review and updating patient chart, medical decision making and response to patient.   Bari Learn, FNP

## 2024-04-13 ENCOUNTER — Encounter: Admitting: Physician Assistant

## 2024-04-13 ENCOUNTER — Telehealth: Admitting: Family Medicine

## 2024-04-13 ENCOUNTER — Encounter: Payer: Self-pay | Admitting: Physician Assistant

## 2024-04-13 ENCOUNTER — Encounter

## 2024-04-13 ENCOUNTER — Telehealth: Admitting: Physician Assistant

## 2024-04-13 ENCOUNTER — Telehealth

## 2024-04-13 ENCOUNTER — Encounter: Payer: Self-pay | Admitting: Family Medicine

## 2024-04-13 DIAGNOSIS — U071 COVID-19: Secondary | ICD-10-CM | POA: Diagnosis not present

## 2024-04-13 MED ORDER — NIRMATRELVIR/RITONAVIR (PAXLOVID)TABLET: 3.0000 | ORAL_TABLET | Freq: Two times a day (BID) | ORAL | 0 refills | Status: AC

## 2024-04-13 MED ORDER — MOLNUPIRAVIR EUA 200MG CAPSULE: 4.0000 | ORAL_CAPSULE | Freq: Two times a day (BID) | ORAL | 0 refills | Status: DC

## 2024-04-13 MED ORDER — NIRMATRELVIR/RITONAVIR (PAXLOVID)TABLET: 3.0000 | ORAL_TABLET | Freq: Two times a day (BID) | ORAL | 0 refills | Status: DC

## 2024-04-13 NOTE — Progress Notes (Signed)
 " Virtual Visit Consent   Julie Johnston, you are scheduled for a virtual visit with a [facility] provider today. Just as with appointments in the office, your consent must be obtained to participate. Your consent will be active for this visit and any virtual visit you may have with one of our providers in the next 365 days. If you have a MyChart account, a copy of this consent can be sent to you electronically.  As this is a virtual visit, video technology does not allow for your provider to perform a traditional examination. This may limit your provider's ability to fully assess your condition. If your provider identifies any concerns that need to be evaluated in person or the need to arrange testing (such as labs, EKG, etc.), we will make arrangements to do so. Although advances in technology are sophisticated, we cannot ensure that it will always work on either your end or our end. If the connection with a video visit is poor, the visit may have to be switched to a telephone visit. With either a video or telephone visit, we are not always able to ensure that we have a secure connection.  By engaging in this virtual visit, you consent to the provision of healthcare and authorize for your insurance to be billed (if applicable) for the services provided during this visit. Depending on your insurance coverage, you may receive a charge related to this service.  I need to obtain your verbal consent now. Are you willing to proceed with your visit today? Julie Johnston has provided verbal consent on 04/13/2024 for a virtual visit (video or telephone). Julie Lauderbaugh, PA-C  Date: 04/13/2024 4:45 PM   Virtual Visit via Video Note   I, Julie Johnston, connected with  Julie Johnston  ([id number], 18-Jul-1970) on 04/13/24 at  4:15 PM EST by a video-enabled telemedicine application and verified that I am speaking with the correct person using two identifiers.  Location: Patient: Virtual Visit Location Patient:  Home Provider: Virtual Visit Location Provider: Home Office   I discussed the limitations of evaluation and management by telemedicine and the availability of in person appointments. The patient expressed understanding and agreed to proceed.    History of Present Illness: Julie Johnston is a 54 y.o. who identifies as a female who was assigned female at birth, and is being seen today for Positive covid test.  HPI: Past medical history significant for hypertension, migraines, TBI, sleep apnea, PE who presents for evaluation of COVID symptoms.  Patient started feeling sick since Wednesday with runny nose, fatigue, body aches and cough.  She did have an e-visit yesterday and was treated for viral URI.  Patient reports she has been intermittently having some wheezing, treated with her inhaler.  At this time she denies any chest pain, shortness of breath, nausea, vomiting, fever.  Patient was sent a picture of her positive result to her MyChart.  Given history of COVID over 1 year ago, and post COVID complications patient is concerned at this time, requesting to be treated with antiviral medications.    Problems:  Patient Active Problem List   Diagnosis Date Noted   Reactive airway disease with acute exacerbation 10/03/2023   Interstitial lung abnormality (ILA) 10/03/2023   Recurrent lower respiratory tract infection 10/03/2023   Antibiotic-induced yeast infection 10/03/2023   SOB (shortness of breath) on exertion 08/31/2023   Engages in vaping 08/31/2023   Subacute bronchitis 08/31/2023   History of pulmonary embolism 08/18/2023   Pleuritic chest pain 08/18/2023  Subacute cough 06/07/2023   Cerebral microvascular disease 05/30/2023   Pulmonary embolism (HCC) 05/05/2023   Hypermobility of joint 05/02/2023   Elevated blood pressure reading 05/02/2023   Sweating increase 04/27/2023   History of COVID-19 04/26/2023   SOB (shortness of breath) 04/26/2023   Hot flashes 04/26/2023   Night  sweats 04/26/2023   Chest tightness 04/26/2023   COVID-19 virus infection 03/06/2023   Other osteoporosis without current pathological fracture 11/19/2022   MDD (major depressive disorder), recurrent episode, severe (HCC) 11/19/2022   Epidermal cyst 11/19/2022   DDD (degenerative disc disease), cervical 09/28/2022   Chronic bilateral low back pain without sciatica 09/08/2022   DDD (degenerative disc disease), lumbar 09/08/2022   Intertrigo 06/15/2022   Excess skin of abdominal wall 06/15/2022   GAD (generalized anxiety disorder) 06/15/2022   Cellulitis of right upper extremity 01/19/2022   Screen for STD (sexually transmitted disease) 08/19/2021   TMJ pain dysfunction syndrome 01/09/2021   Elevated liver enzymes 12/17/2020   Intractable migraine with aura with status migrainosus 12/16/2020   Non-alcoholic fatty liver disease 11/25/2020   History of Roux-en-Y gastric bypass 11/18/2020   Paresthesia 11/18/2020   Post-operative nausea and vomiting 08/29/2020   Parotiditis 08/14/2020   Post-concussion headache 08/04/2020   Acute intractable headache 08/01/2020   Other abnormalities of gait and mobility 06/02/2020   Severe obesity (BMI >= 40) (HCC) 05/27/2020   Dizziness 05/19/2020   Double vision 05/19/2020   Vertigo 05/19/2020   Polyp of transverse colon 02/25/2020   Diverticulosis of colon 02/25/2020   Internal hemorrhoids 02/25/2020   Generalized anxiety disorder 11/07/2019   Cognitive change 10/26/2019   Concussion without loss of consciousness 10/26/2019   Adjustment disorder with mixed anxiety and depressed mood 10/26/2019   Post concussion syndrome 10/01/2019   Acute thoracic back pain 10/01/2019   PTSD (post-traumatic stress disorder) 10/01/2019   Mild traumatic brain injury 09/12/2019   Essential hypertension 06/04/2019   Equivocal stress echocardiogram 05/17/2018   Frequent infections 05/08/2018   Decreased thyroid  stimulating hormone (TSH) level 05/08/2018    Migraine 11/29/2017   Drug-induced weight gain 02/23/2016   Fibromyalgia 09/22/2015   Obstructive sleep apnea 06/18/2015   Lyme disease 05/09/2015   Insomnia 05/09/2015   Palpitations 05/09/2015   Chronic fatigue 05/09/2015   No energy 05/09/2015   Hyperlipidemia 05/09/2015   Attention deficit hyperactivity disorder (ADHD), predominantly inattentive type 05/09/2015   Major depressive disorder    Pulmonary collapse 06/10/2014   Pap smear abnormality of cervix with LGSIL 02/07/2014    Allergies: Allergies[1] Medications: Current Medications[2]  Observations/Objective: Patient is well-developed, well-nourished in no acute distress.  Resting comfortably  at home.  Head is normocephalic, atraumatic.  No labored breathing.  Speech is clear and coherent with logical content.  Patient is alert and oriented at baseline.    Assessment and Plan: 1. Lab test positive for detection of COVID-19 virus (Primary) - molnupiravir EUA (LAGEVRIO) 200 mg CAPS capsule; Take 4 capsules (800 mg total) by mouth 2 (two) times daily for 5 days.  Dispense: 40 capsule; Refill: 0   At home COVID test positive today.  Patient will respond back with a picture of the test via MyChart. Current medications and history reviewed with patient Given previous history of COVID related complications and current medical problems patient does meet guidelines for treatment with antiviral for COVID. COVID medications and current medication interactions reviewed there was interaction with Paxlovid  therefore we will treat with molnupiravir. Continue supportive treatment, Tessalon  Perles for the cough  as needed at this time does not need to be taking azithromycin .  Patient advised to follow-up with PCP in 5 to 7 days Strict ER precautions discussed with patient.  Follow Up Instructions: I discussed the assessment and treatment plan with the patient. The patient was provided an opportunity to ask questions and all were  answered. The patient agreed with the plan and demonstrated an understanding of the instructions.  A copy of instructions were sent to the patient via MyChart unless otherwise noted below.     The patient was advised to call back or seek an in-person evaluation if the symptoms worsen or if the condition fails to improve as anticipated.    Julie Duggin, PA-C    [1]  Allergies Allergen Reactions   Lorcaserin  Other (See Comments)    Made eyes crossed.     Made eyes crossed.  Made eyes crossed.   Made eyes crossed.   Apixaban  Swelling   Belviq [Lorcaserin  Hcl]     Made eyes crossed.   [2]  Current Outpatient Medications:    molnupiravir EUA (LAGEVRIO) 200 mg CAPS capsule, Take 4 capsules (800 mg total) by mouth 2 (two) times daily for 5 days., Disp: 40 capsule, Rfl: 0   albuterol  (VENTOLIN  HFA) 108 (90 Base) MCG/ACT inhaler, Inhale 1-2 puffs into the lungs every 6 (six) hours as needed for wheezing or shortness of breath., Disp: 18 each, Rfl: 1   Albuterol -Budesonide  (AIRSUPRA ) 90-80 MCG/ACT AERO, Inhale 2 puffs into the lungs every 6 (six) hours as needed., Disp: 32.1 g, Rfl: 1   AMBULATORY NON FORMULARY MEDICATION, Medication Name: COVID vaccine  2025-2026 R91.8 Interstitial Lung problem, Disp: 1 Device, Rfl: 0   azithromycin  (ZITHROMAX ) 250 MG tablet, Take 500 mg once, then 250 mg for four days, Disp: 6 tablet, Rfl: 0   benzonatate  (TESSALON ) 100 MG capsule, Take 1 capsule (100 mg total) by mouth 2 (two) times daily as needed for cough., Disp: 20 capsule, Rfl: 0   celecoxib  (CELEBREX ) 200 MG capsule, Take one tablet twice a day., Disp: 180 capsule, Rfl: 1   Erenumab -aooe (AIMOVIG ) 140 MG/ML SOAJ, INJECT 140 MG INTO THE SKIN EVERY 30 DAYS, Disp: 3 mL, Rfl: 2   Eszopiclone 3 MG TABS, Take 3 mg by mouth at bedtime as needed., Disp: , Rfl:    fluticasone  (FLONASE ) 50 MCG/ACT nasal spray, SPRAY 2 SPRAYS INTO EACH NOSTRIL EVERY DAY, Disp: 48 mL, Rfl: 1   gabapentin  (NEURONTIN ) 800 MG  tablet, TAKE 1 TABLET BY MOUTH THREE TIMES A DAY, Disp: 90 tablet, Rfl: 0   hydrOXYzine  (VISTARIL ) 25 MG capsule, Take 1 capsule (25 mg total) by mouth every 8 (eight) hours as needed for itching., Disp: 30 capsule, Rfl: 0   ipratropium-albuterol  (DUONEB) 0.5-2.5 (3) MG/3ML SOLN, TAKE 3 MLS BY NEBULIZATION EVERY 2 (TWO) HOURS AS NEEDED (WHEEZE, SOB)., Disp: 90 mL, Rfl: 1   mupirocin  ointment (BACTROBAN ) 2 %, APPLY TO AFFECTED AREA 3 TIMES A DAY FOR 7 DAYS, Disp: 22 g, Rfl: 3   norethindrone (MICRONOR) 0.35 MG tablet, Take 1 tablet by mouth daily., Disp: , Rfl:    nystatin  cream (MYCOSTATIN ), Apply 1 Application topically 2 (two) times daily., Disp: 30 g, Rfl: 2   omeprazole  (PRILOSEC) 20 MG capsule, TAKE 1 CAPSULE BY MOUTH EVERY DAY, Disp: 90 capsule, Rfl: 3   predniSONE  (STERAPRED UNI-PAK 21 TAB) 10 MG (21) TBPK tablet, Use as directed, Disp: 21 tablet, Rfl: 0   rizatriptan  (MAXALT -MLT) 10 MG disintegrating tablet, TAKE  1 TABLET BY MOUTH AS NEEDED FOR MIGRAINE. MAY REPEAT IN 2 HOURS IF NEEDED, Disp: 12 tablet, Rfl: 3   selegiline (EMSAM) 9 MG/24HR, Place 12 mg onto the skin daily. Pt takes 12mg , Disp: , Rfl:    tiZANidine  (ZANAFLEX ) 2 MG tablet, Take 1 tablet (2 mg total) by mouth 3 (three) times daily., Disp: 270 tablet, Rfl: 1   traZODone (DESYREL) 100 MG tablet, Take by mouth at bedtime as needed., Disp: , Rfl:    umeclidinium-vilanterol (ANORO ELLIPTA ) 62.5-25 MCG/ACT AEPB, Inhale 1 puff into the lungs daily., Disp: 60 each, Rfl: 3   valACYclovir  (VALTREX ) 1000 MG tablet, Take 2 tablets (2,000 mg total) by mouth 2 (two) times daily. For one day., Disp: 14 tablet, Rfl: 2  Current Facility-Administered Medications:    ipratropium-albuterol  (DUONEB) 0.5-2.5 (3) MG/3ML nebulizer solution 3 mL, 3 mL, Nebulization, Once,   "

## 2024-04-13 NOTE — Patient Instructions (Addendum)
 " Julie Johnston, thank you for joining Julie Filsinger, Julie Johnston for today's virtual visit.  While this provider is not your primary care provider (PCP), if your PCP is located in our provider database this encounter information will be shared with them immediately following your visit.   A [facility] MyChart account gives you access to today's visit and all your visits, tests, and labs performed at [facility]  click here if you don't have a [facility] MyChart account or go to [email or web address]  Consent: (Patient) Julie Johnston provided verbal consent for this virtual visit at the beginning of the encounter.  Current Medications:  Current Outpatient Medications:    molnupiravir EUA (LAGEVRIO) 200 mg CAPS capsule, Take 4 capsules (800 mg total) by mouth 2 (two) times daily for 5 days., Disp: 40 capsule, Rfl: 0   albuterol  (VENTOLIN  HFA) 108 (90 Base) MCG/ACT inhaler, Inhale 1-2 puffs into the lungs every 6 (six) hours as needed for wheezing or shortness of breath., Disp: 18 each, Rfl: 1   Albuterol -Budesonide  (AIRSUPRA ) 90-80 MCG/ACT AERO, Inhale 2 puffs into the lungs every 6 (six) hours as needed., Disp: 32.1 g, Rfl: 1   AMBULATORY NON FORMULARY MEDICATION, Medication Name: COVID vaccine  2025-2026 R91.8 Interstitial Lung problem, Disp: 1 Device, Rfl: 0   azithromycin  (ZITHROMAX ) 250 MG tablet, Take 500 mg once, then 250 mg for four days, Disp: 6 tablet, Rfl: 0   benzonatate  (TESSALON ) 100 MG capsule, Take 1 capsule (100 mg total) by mouth 2 (two) times daily as needed for cough., Disp: 20 capsule, Rfl: 0   celecoxib  (CELEBREX ) 200 MG capsule, Take one tablet twice a day., Disp: 180 capsule, Rfl: 1   Erenumab -aooe (AIMOVIG ) 140 MG/ML SOAJ, INJECT 140 MG INTO THE SKIN EVERY 30 DAYS, Disp: 3 mL, Rfl: 2   Eszopiclone 3 MG TABS, Take 3 mg by mouth at bedtime as needed., Disp: , Rfl:    fluticasone  (FLONASE ) 50 MCG/ACT nasal spray, SPRAY 2 SPRAYS INTO EACH NOSTRIL EVERY DAY, Disp:  48 mL, Rfl: 1   gabapentin  (NEURONTIN ) 800 MG tablet, TAKE 1 TABLET BY MOUTH THREE TIMES A DAY, Disp: 90 tablet, Rfl: 0   hydrOXYzine  (VISTARIL ) 25 MG capsule, Take 1 capsule (25 mg total) by mouth every 8 (eight) hours as needed for itching., Disp: 30 capsule, Rfl: 0   ipratropium-albuterol  (DUONEB) 0.5-2.5 (3) MG/3ML SOLN, TAKE 3 MLS BY NEBULIZATION EVERY 2 (TWO) HOURS AS NEEDED (WHEEZE, SOB)., Disp: 90 mL, Rfl: 1   mupirocin  ointment (BACTROBAN ) 2 %, APPLY TO AFFECTED AREA 3 TIMES A DAY FOR 7 DAYS, Disp: 22 g, Rfl: 3   norethindrone (MICRONOR) 0.35 MG tablet, Take 1 tablet by mouth daily., Disp: , Rfl:    nystatin  cream (MYCOSTATIN ), Apply 1 Application topically 2 (two) times daily., Disp: 30 g, Rfl: 2   omeprazole  (PRILOSEC) 20 MG capsule, TAKE 1 CAPSULE BY MOUTH EVERY DAY, Disp: 90 capsule, Rfl: 3   predniSONE  (STERAPRED UNI-PAK 21 TAB) 10 MG (21) TBPK tablet, Use as directed, Disp: 21 tablet, Rfl: 0   rizatriptan  (MAXALT -MLT) 10 MG disintegrating tablet, TAKE 1 TABLET BY MOUTH AS NEEDED FOR MIGRAINE. MAY REPEAT IN 2 HOURS IF NEEDED, Disp: 12 tablet, Rfl: 3   selegiline (EMSAM) 9 MG/24HR, Place 12 mg onto the skin daily. Pt takes 12mg , Disp: , Rfl:    tiZANidine  (ZANAFLEX ) 2 MG tablet, Take 1 tablet (2 mg total) by mouth 3 (three) times daily., Disp: 270 tablet, Rfl: 1   traZODone (DESYREL) 100 MG  tablet, Take by mouth at bedtime as needed., Disp: , Rfl:    umeclidinium-vilanterol (ANORO ELLIPTA ) 62.5-25 MCG/ACT AEPB, Inhale 1 puff into the lungs daily., Disp: 60 each, Rfl: 3   valACYclovir  (VALTREX ) 1000 MG tablet, Take 2 tablets (2,000 mg total) by mouth 2 (two) times daily. For one day., Disp: 14 tablet, Rfl: 2  Current Facility-Administered Medications:    ipratropium-albuterol  (DUONEB) 0.5-2.5 (3) MG/3ML nebulizer solution 3 mL, 3 mL, Nebulization, Once,    Medications ordered in this encounter:  Meds ordered this encounter  Medications   molnupiravir EUA (LAGEVRIO) 200 mg CAPS  capsule    Sig: Take 4 capsules (800 mg total) by mouth 2 (two) times daily for 5 days.    Dispense:  40 capsule    Refill:  0    Supervising Provider:   BLAISE ALEENE KIDD [[id number]]     *If you need refills on other medications prior to your next appointment, please contact your pharmacy*  Follow-Up: Call back or seek an in-person evaluation if the symptoms worsen or if the condition fails to improve as anticipated.  [facility] [phone]  Other Instructions  Patient advised to follow-up with PCP in 5 to 7 days Strict ER precautions discussed with patient.  If you have been instructed to have an in-person evaluation today at a local Urgent Care facility, please use the link below. It will take you to a list of all of our available [facility], including address, phone number and hours of operation. Please do not delay care.  [facility]  If you or a family member do not have a primary care provider, use the link below to schedule a visit and establish care. When you choose a [facility] primary care physician or advanced practice provider, you gain a long-term partner in health. Find a Primary Care Provider  Learn more about [facility]'s in-office and virtual care options: [facility] - Get Care Now  "

## 2024-04-13 NOTE — Progress Notes (Signed)
 " Virtual Visit Consent   Julie Johnston, you are scheduled for a virtual visit with a [facility] provider today. Just as with appointments in the office, your consent must be obtained to participate. Your consent will be active for this visit and any virtual visit you may have with one of our providers in the next 365 days. If you have a MyChart account, a copy of this consent can be sent to you electronically.  As this is a virtual visit, video technology does not allow for your provider to perform a traditional examination. This may limit your provider's ability to fully assess your condition. If your provider identifies any concerns that need to be evaluated in person or the need to arrange testing (such as labs, EKG, etc.), we will make arrangements to do so. Although advances in technology are sophisticated, we cannot ensure that it will always work on either your end or our end. If the connection with a video visit is poor, the visit may have to be switched to a telephone visit. With either a video or telephone visit, we are not always able to ensure that we have a secure connection.  By engaging in this virtual visit, you consent to the provision of healthcare and authorize for your insurance to be billed (if applicable) for the services provided during this visit. Depending on your insurance coverage, you may receive a charge related to this service.  I need to obtain your verbal consent now. Are you willing to proceed with your visit today? Julie Johnston has provided verbal consent on 04/13/2024 for a virtual visit (video or telephone). [name], FNP  Date: 04/13/2024 6:27 PM   Virtual Visit via Video Note   I, [name], connected with  Julie Johnston  ([id number], 16-Jul-1970) on 04/13/24 at  6:30 PM EST by a video-enabled telemedicine application and verified that I am speaking with the correct person using two identifiers.  Location: Patient: Virtual Visit Location Patient:  Home Provider: Virtual Visit Location Provider: Home Office   I discussed the limitations of evaluation and management by telemedicine and the availability of in person appointments. The patient expressed understanding and agreed to proceed.    History of Present Illness: Julie Johnston is a 54 y.o. who identifies as a female who was assigned female at birth, and is being seen today for covid positive testing. She is extremely agitated regarding her initial visit at which time the mupirocin  was prescribed and was too expensive. It was given due to drug interactions. It was then changed to paxlovid  which patient reports was not received in pharmacy. It appears it was sent. I will resend. She is very angry and would not allow me to talk. I tried to tell her I am trying to help her. I messaged her so she will be able to message me back. She hung up on me.   HPI: HPI  Problems:  Patient Active Problem List   Diagnosis Date Noted   Reactive airway disease with acute exacerbation 10/03/2023   Interstitial lung abnormality (ILA) 10/03/2023   Recurrent lower respiratory tract infection 10/03/2023   Antibiotic-induced yeast infection 10/03/2023   SOB (shortness of breath) on exertion 08/31/2023   Engages in vaping 08/31/2023   Subacute bronchitis 08/31/2023   History of pulmonary embolism 08/18/2023   Pleuritic chest pain 08/18/2023   Subacute cough 06/07/2023   Cerebral microvascular disease 05/30/2023   Pulmonary embolism (HCC) 05/05/2023   Hypermobility of joint 05/02/2023   Elevated blood  pressure reading 05/02/2023   Sweating increase 04/27/2023   History of COVID-19 04/26/2023   SOB (shortness of breath) 04/26/2023   Hot flashes 04/26/2023   Night sweats 04/26/2023   Chest tightness 04/26/2023   COVID-19 virus infection 03/06/2023   Other osteoporosis without current pathological fracture 11/19/2022   MDD (major depressive disorder), recurrent episode, severe (HCC) 11/19/2022    Epidermal cyst 11/19/2022   DDD (degenerative disc disease), cervical 09/28/2022   Chronic bilateral low back pain without sciatica 09/08/2022   DDD (degenerative disc disease), lumbar 09/08/2022   Intertrigo 06/15/2022   Excess skin of abdominal wall 06/15/2022   GAD (generalized anxiety disorder) 06/15/2022   Cellulitis of right upper extremity 01/19/2022   Screen for STD (sexually transmitted disease) 08/19/2021   TMJ pain dysfunction syndrome 01/09/2021   Elevated liver enzymes 12/17/2020   Intractable migraine with aura with status migrainosus 12/16/2020   Non-alcoholic fatty liver disease 11/25/2020   History of Roux-en-Y gastric bypass 11/18/2020   Paresthesia 11/18/2020   Post-operative nausea and vomiting 08/29/2020   Parotiditis 08/14/2020   Post-concussion headache 08/04/2020   Acute intractable headache 08/01/2020   Other abnormalities of gait and mobility 06/02/2020   Severe obesity (BMI >= 40) (HCC) 05/27/2020   Dizziness 05/19/2020   Double vision 05/19/2020   Vertigo 05/19/2020   Polyp of transverse colon 02/25/2020   Diverticulosis of colon 02/25/2020   Internal hemorrhoids 02/25/2020   Generalized anxiety disorder 11/07/2019   Cognitive change 10/26/2019   Concussion without loss of consciousness 10/26/2019   Adjustment disorder with mixed anxiety and depressed mood 10/26/2019   Post concussion syndrome 10/01/2019   Acute thoracic back pain 10/01/2019   PTSD (post-traumatic stress disorder) 10/01/2019   Mild traumatic brain injury 09/12/2019   Essential hypertension 06/04/2019   Equivocal stress echocardiogram 05/17/2018   Frequent infections 05/08/2018   Decreased thyroid  stimulating hormone (TSH) level 05/08/2018   Migraine 11/29/2017   Drug-induced weight gain 02/23/2016   Fibromyalgia 09/22/2015   Obstructive sleep apnea 06/18/2015   Lyme disease 05/09/2015   Insomnia 05/09/2015   Palpitations 05/09/2015   Chronic fatigue 05/09/2015   No energy  05/09/2015   Hyperlipidemia 05/09/2015   Attention deficit hyperactivity disorder (ADHD), predominantly inattentive type 05/09/2015   Major depressive disorder    Pulmonary collapse 06/10/2014   Pap smear abnormality of cervix with LGSIL 02/07/2014    Allergies: Allergies[1] Medications: Current Medications[2]  Observations/Objective: Patient is well-developed, well-nourished in no acute distress.  Resting comfortably  at home.  Head is normocephalic, atraumatic.  No labored breathing.  Speech is clear and coherent with logical content.  Patient is alert and oriented at baseline.    Assessment and Plan: 1. Lab test positive for detection of COVID-19 virus - nirmatrelvir /ritonavir  (PAXLOVID ) 20 x 150 MG & 10 x 100MG  TABS; Take 3 tablets by mouth 2 (two) times daily for 5 days. (Take nirmatrelvir  150 mg two tablets twice daily for 5 days and ritonavir  100 mg one tablet twice daily for 5 days) Patient GFR is 92.2  Dispense: 30 tablet; Refill: 0  I have resent paxlovid  after confirming her pharmacy. I have messaged her so she will be able to message me back if her pharmacy does not receive rx.   Follow Up Instructions: I discussed the assessment and treatment plan with the patient. The patient was provided an opportunity to ask questions and all were answered. The patient agreed with the plan and demonstrated an understanding of the instructions.  A copy of  instructions were sent to the patient via MyChart unless otherwise noted below.     The patient was advised to call back or seek an in-person evaluation if the symptoms worsen or if the condition fails to improve as anticipated.    Aileen Amore, FNP     [1]  Allergies Allergen Reactions   Lorcaserin  Other (See Comments)    Made eyes crossed.     Made eyes crossed.  Made eyes crossed.   Made eyes crossed.   Apixaban  Swelling   Belviq [Lorcaserin  Hcl]     Made eyes crossed.   [2]  Current Outpatient Medications:     albuterol  (VENTOLIN  HFA) 108 (90 Base) MCG/ACT inhaler, Inhale 1-2 puffs into the lungs every 6 (six) hours as needed for wheezing or shortness of breath., Disp: 18 each, Rfl: 1   Albuterol -Budesonide  (AIRSUPRA ) 90-80 MCG/ACT AERO, Inhale 2 puffs into the lungs every 6 (six) hours as needed., Disp: 32.1 g, Rfl: 1   AMBULATORY NON FORMULARY MEDICATION, Medication Name: COVID vaccine  2025-2026 R91.8 Interstitial Lung problem, Disp: 1 Device, Rfl: 0   azithromycin  (ZITHROMAX ) 250 MG tablet, Take 500 mg once, then 250 mg for four days, Disp: 6 tablet, Rfl: 0   benzonatate  (TESSALON ) 100 MG capsule, Take 1 capsule (100 mg total) by mouth 2 (two) times daily as needed for cough., Disp: 20 capsule, Rfl: 0   celecoxib  (CELEBREX ) 200 MG capsule, Take one tablet twice a day., Disp: 180 capsule, Rfl: 1   Erenumab -aooe (AIMOVIG ) 140 MG/ML SOAJ, INJECT 140 MG INTO THE SKIN EVERY 30 DAYS, Disp: 3 mL, Rfl: 2   Eszopiclone 3 MG TABS, Take 3 mg by mouth at bedtime as needed., Disp: , Rfl:    fluticasone  (FLONASE ) 50 MCG/ACT nasal spray, SPRAY 2 SPRAYS INTO EACH NOSTRIL EVERY DAY, Disp: 48 mL, Rfl: 1   gabapentin  (NEURONTIN ) 800 MG tablet, TAKE 1 TABLET BY MOUTH THREE TIMES A DAY, Disp: 90 tablet, Rfl: 0   hydrOXYzine  (VISTARIL ) 25 MG capsule, Take 1 capsule (25 mg total) by mouth every 8 (eight) hours as needed for itching., Disp: 30 capsule, Rfl: 0   ipratropium-albuterol  (DUONEB) 0.5-2.5 (3) MG/3ML SOLN, TAKE 3 MLS BY NEBULIZATION EVERY 2 (TWO) HOURS AS NEEDED (WHEEZE, SOB)., Disp: 90 mL, Rfl: 1   mupirocin  ointment (BACTROBAN ) 2 %, APPLY TO AFFECTED AREA 3 TIMES A DAY FOR 7 DAYS, Disp: 22 g, Rfl: 3   nirmatrelvir /ritonavir  (PAXLOVID ) 20 x 150 MG & 10 x 100MG  TABS, Take 3 tablets by mouth 2 (two) times daily for 5 days. (Take nirmatrelvir  150 mg two tablets twice daily for 5 days and ritonavir  100 mg one tablet twice daily for 5 days) Patient GFR is 92.2, Disp: 30 tablet, Rfl: 0   norethindrone (MICRONOR) 0.35 MG  tablet, Take 1 tablet by mouth daily., Disp: , Rfl:    nystatin  cream (MYCOSTATIN ), Apply 1 Application topically 2 (two) times daily., Disp: 30 g, Rfl: 2   omeprazole  (PRILOSEC) 20 MG capsule, TAKE 1 CAPSULE BY MOUTH EVERY DAY, Disp: 90 capsule, Rfl: 3   predniSONE  (STERAPRED UNI-PAK 21 TAB) 10 MG (21) TBPK tablet, Use as directed, Disp: 21 tablet, Rfl: 0   rizatriptan  (MAXALT -MLT) 10 MG disintegrating tablet, TAKE 1 TABLET BY MOUTH AS NEEDED FOR MIGRAINE. MAY REPEAT IN 2 HOURS IF NEEDED, Disp: 12 tablet, Rfl: 3   selegiline (EMSAM) 9 MG/24HR, Place 12 mg onto the skin daily. Pt takes 12mg , Disp: , Rfl:    tiZANidine  (ZANAFLEX ) 2 MG  tablet, Take 1 tablet (2 mg total) by mouth 3 (three) times daily., Disp: 270 tablet, Rfl: 1   traZODone (DESYREL) 100 MG tablet, Take by mouth at bedtime as needed., Disp: , Rfl:    umeclidinium-vilanterol (ANORO ELLIPTA ) 62.5-25 MCG/ACT AEPB, Inhale 1 puff into the lungs daily., Disp: 60 each, Rfl: 3   valACYclovir  (VALTREX ) 1000 MG tablet, Take 2 tablets (2,000 mg total) by mouth 2 (two) times daily. For one day., Disp: 14 tablet, Rfl: 2  Current Facility-Administered Medications:    ipratropium-albuterol  (DUONEB) 0.5-2.5 (3) MG/3ML nebulizer solution 3 mL, 3 mL, Nebulization, Once,   "

## 2024-04-13 NOTE — Progress Notes (Signed)
 DUPLICATE VISIT

## 2024-04-13 NOTE — Addendum Note (Signed)
 Addended by: Jhane Lorio on: 04/13/2024 05:39 PM   Modules accepted: Orders

## 2024-04-21 ENCOUNTER — Other Ambulatory Visit: Payer: Self-pay | Admitting: Physician Assistant

## 2024-04-21 DIAGNOSIS — J45901 Unspecified asthma with (acute) exacerbation: Secondary | ICD-10-CM

## 2024-04-21 DIAGNOSIS — J22 Unspecified acute lower respiratory infection: Secondary | ICD-10-CM

## 2024-04-21 DIAGNOSIS — R918 Other nonspecific abnormal finding of lung field: Secondary | ICD-10-CM
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
## 2024-04-25 ENCOUNTER — Encounter: Payer: Self-pay | Admitting: Neurology

## 2024-04-25 ENCOUNTER — Telehealth: Admitting: Neurology

## 2024-04-25 VITALS — Ht 65.0 in | Wt 200.0 lb

## 2024-04-25 DIAGNOSIS — M542 Cervicalgia: Secondary | ICD-10-CM

## 2024-04-25 DIAGNOSIS — G8929 Other chronic pain: Secondary | ICD-10-CM | POA: Diagnosis not present

## 2024-04-25 DIAGNOSIS — R442 Other hallucinations: Secondary | ICD-10-CM

## 2024-04-25 DIAGNOSIS — G43109 Migraine with aura, not intractable, without status migrainosus: Secondary | ICD-10-CM | POA: Diagnosis not present

## 2024-04-25 DIAGNOSIS — G44229 Chronic tension-type headache, not intractable: Secondary | ICD-10-CM

## 2024-04-25 NOTE — Patient Instructions (Signed)
 Look into Emgality  injection, Ajovy injection, Qulipta  again

## 2024-04-27 ENCOUNTER — Encounter: Payer: Self-pay | Admitting: Physician Assistant

## 2024-11-06 ENCOUNTER — Ambulatory Visit: Payer: Self-pay | Admitting: Neurology
